# Patient Record
Sex: Female | Born: 1945 | Race: Black or African American | Hispanic: No | Marital: Married | Smoking: Former smoker
Health system: Southern US, Community
[De-identification: ages and names within clinical notes are randomized; demographics above are authoritative.]

## PROBLEM LIST (undated history)

## (undated) DIAGNOSIS — E785 Hyperlipidemia, unspecified: Secondary | ICD-10-CM

## (undated) DIAGNOSIS — I10 Essential (primary) hypertension: Secondary | ICD-10-CM

## (undated) DIAGNOSIS — E039 Hypothyroidism, unspecified: Secondary | ICD-10-CM

## (undated) HISTORY — PX: BACK SURGERY: SHX140

---

## 1997-12-14 ENCOUNTER — Emergency Department (HOSPITAL_COMMUNITY): Admission: EM | Admit: 1997-12-14 | Discharge: 1997-12-14 | Payer: Self-pay | Admitting: Emergency Medicine

## 1997-12-14 ENCOUNTER — Encounter: Payer: Self-pay | Admitting: Emergency Medicine

## 1997-12-25 ENCOUNTER — Ambulatory Visit (HOSPITAL_COMMUNITY): Admission: RE | Admit: 1997-12-25 | Discharge: 1997-12-25 | Payer: Self-pay | Admitting: Internal Medicine

## 1997-12-25 ENCOUNTER — Encounter: Payer: Self-pay | Admitting: Internal Medicine

## 1998-01-06 ENCOUNTER — Encounter: Payer: Self-pay | Admitting: Internal Medicine

## 1998-01-06 ENCOUNTER — Inpatient Hospital Stay (HOSPITAL_COMMUNITY): Admission: AD | Admit: 1998-01-06 | Discharge: 1998-01-09 | Payer: Self-pay | Admitting: Internal Medicine

## 1998-01-07 ENCOUNTER — Encounter: Payer: Self-pay | Admitting: Internal Medicine

## 1998-01-15 ENCOUNTER — Encounter: Admission: RE | Admit: 1998-01-15 | Discharge: 1998-04-15 | Payer: Self-pay | Admitting: Internal Medicine

## 2000-05-02 ENCOUNTER — Other Ambulatory Visit: Admission: RE | Admit: 2000-05-02 | Discharge: 2000-05-02 | Payer: Self-pay | Admitting: Internal Medicine

## 2001-05-08 ENCOUNTER — Other Ambulatory Visit: Admission: RE | Admit: 2001-05-08 | Discharge: 2001-05-08 | Payer: Self-pay | Admitting: Internal Medicine

## 2001-05-15 ENCOUNTER — Ambulatory Visit (HOSPITAL_COMMUNITY): Admission: RE | Admit: 2001-05-15 | Discharge: 2001-05-15 | Payer: Self-pay | Admitting: Internal Medicine

## 2001-05-15 ENCOUNTER — Encounter: Payer: Self-pay | Admitting: Internal Medicine

## 2003-08-12 ENCOUNTER — Ambulatory Visit (HOSPITAL_COMMUNITY): Admission: RE | Admit: 2003-08-12 | Discharge: 2003-08-12 | Payer: Self-pay | Admitting: Internal Medicine

## 2003-09-12 ENCOUNTER — Encounter: Admission: RE | Admit: 2003-09-12 | Discharge: 2003-09-12 | Payer: Self-pay | Admitting: Internal Medicine

## 2003-10-28 ENCOUNTER — Emergency Department (HOSPITAL_COMMUNITY): Admission: EM | Admit: 2003-10-28 | Discharge: 2003-10-28 | Payer: Self-pay | Admitting: Emergency Medicine

## 2003-10-30 ENCOUNTER — Ambulatory Visit (HOSPITAL_COMMUNITY): Admission: RE | Admit: 2003-10-30 | Discharge: 2003-10-30 | Payer: Self-pay | Admitting: Pediatrics

## 2003-12-08 ENCOUNTER — Encounter: Admission: RE | Admit: 2003-12-08 | Discharge: 2003-12-08 | Payer: Self-pay | Admitting: Neurosurgery

## 2003-12-24 ENCOUNTER — Encounter: Admission: RE | Admit: 2003-12-24 | Discharge: 2003-12-24 | Payer: Self-pay | Admitting: Neurosurgery

## 2004-01-23 ENCOUNTER — Ambulatory Visit (HOSPITAL_COMMUNITY): Admission: RE | Admit: 2004-01-23 | Discharge: 2004-01-23 | Payer: Self-pay | Admitting: Neurosurgery

## 2004-03-23 ENCOUNTER — Inpatient Hospital Stay (HOSPITAL_COMMUNITY): Admission: RE | Admit: 2004-03-23 | Discharge: 2004-03-28 | Payer: Self-pay | Admitting: Neurosurgery

## 2005-04-23 ENCOUNTER — Inpatient Hospital Stay (HOSPITAL_COMMUNITY): Admission: EM | Admit: 2005-04-23 | Discharge: 2005-04-25 | Payer: Self-pay | Admitting: Emergency Medicine

## 2007-01-22 ENCOUNTER — Encounter: Admission: RE | Admit: 2007-01-22 | Discharge: 2007-04-22 | Payer: Self-pay | Admitting: Internal Medicine

## 2010-01-07 ENCOUNTER — Ambulatory Visit (HOSPITAL_COMMUNITY): Admission: RE | Admit: 2010-01-07 | Discharge: 2010-01-07 | Payer: Self-pay | Admitting: Internal Medicine

## 2010-07-23 NOTE — Discharge Summary (Signed)
NAMESAARA, KIJOWSKI                  ACCOUNT NO.:  [id number]   MEDICAL RECORD NO.:  [id number]          PATIENT TYPE:  INP   LOCATION:  3315                         FACILITY:  [facility]   PHYSICIAN:  Isidor Holts, M.D.  DATE OF BIRTH:  [birth year]   DATE OF ADMISSION:  04/23/2005  DATE OF DISCHARGE:  04/25/2005                                 DISCHARGE SUMMARY   PRIMARY CARE PHYSICIAN:  Dr. Andi Devon   DISCHARGE DIAGNOSES:  1.  Abdominal pain, secondary to severe constipation/fecal impaction.  2.  Hypotension secondary to volume depletion plus superadded medication      side effect.  3.  Controlled type 2 diabetes mellitus.  4.  Hypothyroidism.  5.  Smoking history.  6.  Prior history transient ischemic attacks.  7.  History of lumbosacral degenerative joint disease.  8.  Iron deficiency anemia.   DISCHARGE MEDICATIONS:  1.  Prempro (0.625/2.5) one pill p.o. daily.  2.  Actos 30 mg p.o. daily.  3.  Amitriptyline 25 mg p.o. daily (was on 50 mg p.o. daily).  4.  Altace 2.5 mg p.o. daily (was on 5 mg p.o. daily).  5.  Levothyroxine 100 mcg p.o. daily.  6.  Neurontin 300 mg p.o. daily.  7.  Lantus insulin 10 units subcutaneously b.i.d. (was on 20 units      subcutaneous q.a.m. and 15 units subcutaneously q.h.s.).  8.  Aspirin 81 mg p.o. daily.  9.  Colace 100 mg p.o. b.i.d.  10. Nicoderm CQ (14 mg/24 hours) one patch to skin daily.  11. Nu-Iron 150 mg p.o. daily.   PROCEDURES:  1.  Abdominal/pelvic CT scan dated April 23, 2005.  This showed no acute      findings, marked amount of retained stool seen throughout colon and      rectum consistent with fecal impaction, also rectal impaction.  2.  Chest x-ray dated April 23, 2005.  This was negative.  There was no      heart failure or infiltrate.   CONSULTS:  None.   ADMISSION HISTORY:  As in H&P of April 23, 2005, dictated by Dr.  Isidor Holts.  However, in brief, this is a 65 year old female, with  known  history of type 2 diabetes mellitus, hypothyroidism, prior TIAs, DJD  lumbosacral spine who presents with abdominal pain and chills for one day,  but no objective fever.  She was found on initial evaluation in the  emergency department, to be hypotensive with blood pressure of 70/48 mmHg.  She was therefore admitted for further evaluation, investigation, and  management.   CLINICAL COURSE:  #1 - ABDOMINAL PAIN:  This patient presents with lower  abdominal pain, described as intermittent/crampy.  Patient had chills,  however, had no objective fever.  Abdominal CT scan was done, dated April 23, 2005.  Essentially, this showed no acute intra-abdominal pathology,  however, demonstrated fecal loading of colon and rectum consistent with  fecal impaction.  Obviously, the culprit here, was severe constipation.  The  patient had not passed any bowel movements for the past four days.  This was  unassociated with vomiting, and certainly bowel sounds were heard on  physical examination.  She was managed with Fleet enemas, Dulcolax  suppositories as required, and scheduled Colace with complete resolution of  symptoms.  Thereafter, during the course of hospital stay she was able to  move her bowels quite regularly.   #2 - HYPOTENSION:  This occurred against background of possible chills and  lower abdominal pain.  Initial suspicion was, of course, sepsis.  However,  abdominal CT scan demonstrated no acute intra-abdominal pathology.  Urinalysis was negative.  Chest x-ray was also negative.  Patient had no  documented fever.  WBC on initial presentation was 8.4.  Although this went  up to 14.6 on April 24, 2005 by April 25, 2005 it had normalized at  11.  It was deemed possibly, to be reactive.  Blood cultures remained  negative.  Patient was initially empirically started on Primaxin to afford  broad-spectrum antibiotic coverage for possible intra-abdominal sepsis or  urinary tract infection.   However, no evidence of infective focus was  elicited and Primaxin was therefore discontinued in a.m. of April 25, 2005.  Patient's hypotension responded to aggressive intravenous fluid  hydration.  As of April 25, 2005 her blood pressure was entirely  reasonable at 125/60.  Obviously, during her hypotensive episode, her  antihypertensive medication and tricyclic antidepressive medications were  held.  These have been reinstated at half the pre admission dosages, until  reviewed by patient's PMD.   #3 - TYPE 2 DIABETES MELLITUS:  This appears quite well controlled.  Patient  was managed with approximately two-thirds of her pre admission scheduled  insulin dosage.  She remained euglycemic, throughout the course of her  hospital stay.  CBG was 89 in the a.m. of April 25, 2005.  Patient is  therefore being discharged on scheduled Lantus, in a dose of 10 units  subcutaneously b.i.d.  Of course, this may be subsequently adjusted as  indicated, by patient's PMD.   #4 - SMOKING HISTORY:  Patient admits to smoking approximately half a packet  of cigarettes per day.  She has done this for approximately 30 years.  She  was managed with smoking cessation counseling and Nicoderm CQ patch.  She  appears determined to quit.   #5 - IRON DEFICIENCY ANEMIA:  CBC on April 25, 2005 showed a mild anemia  with hemoglobin of 10.5 and hematocrit of 31.9.  Iron studies showed the  following findings:  Iron 25, percentage saturation 9, TIBC 265, ferritin  40.  B12 was normal at 689.  Folate was normal at 12.7.  Patient has been  started on iron supplementation.   #6 - LUMBOSACRAL DJD:  Patient has had no symptoms referable to this during  the course of her hospital stay.   #7 - HYPOTHYROIDISM:  Patient continued on replacement therapy during the  course of her hospital stay.  TSH dated April 24, 2005, was normal at  5.499.  DISPOSITION:  Patient is deemed clinically stable and in  satisfactory  condition, to be discharged on April 25, 2005.  It is noted that cardiac  enzymes remained unelevated and EKG showed no acute ischemic changes.   DIET:  Carbohydrate modified/Healthy heart diet.   ACTIVITY:  No restrictions.   WOUND CARE:  Not applicable.   PAIN MANAGEMENT:  Not applicable.   FOLLOW-UP INSTRUCTIONS:  Patient has been recommended to follow up with her  PMD, Dr. Andi Devon, this week.  She has  verbalized understanding.  She has been instructed to call for an appointment.      Isidor Holts, M.D.  Electronically Signed     CO/MEDQ  D:  04/25/2005  T:  04/25/2005  Job:  [id number]   cc:   Merlene Laughter. Renae Gloss, M.D.  Fax: [phone]

## 2010-07-23 NOTE — H&P (Signed)
NAMEKORRINE, SICARD                  ACCOUNT NO.:  [id number]   MEDICAL RECORD NO.:  [id number]          PATIENT TYPE:  OIB   LOCATION:  NA                           FACILITY:  [facility]   PHYSICIAN:  Hilda Lias, M.D.   DATE OF BIRTH:  [birth year]   DATE OF ADMISSION:  DATE OF DISCHARGE:                                HISTORY & PHYSICAL   Ms. Gardella is a lady who is being admitted because of back pain radiating to  both legs __________.  I initially saw this lady in September 2005  complaining of back pain radiating to both legs especially with walking.  The patient had been seen by a neurologist. She had been loosing control of  her balance. She found two weeks prior to be seen by me with diagnosis of  having a mini stroke of the brain.  She is stable and she wants to proceed  with surgery because the pain is getting worse.   PAST MEDICAL HISTORY:  Two CVA taken care of by the neurologist.   ALLERGIES:  She is not allergic to any medication.   SOCIAL HISTORY:  She does not drink but she smokes two packs of cigarettes a  day.   REVIEW OF SYMPTOMS:  Positive for back pain, leg pain, diabetes mellitus.   PHYSICAL EXAMINATION:  GENERAL:  The patient came to my office walking with  a cane.  HEENT:  Head is atraumatic.  NECK:  Normal.  LUNGS:  There is rhonchi bilaterally.  HEART:  Sounds normal.  ABDOMEN:  Normal. __________ pulses.  NEUROLOGIC:  Mental status normal.  Cranial nerves normal.  EXTREMITIES:  She had weakness and dorsiflexion of both feet and mild  weakness of both quads.  but no evidence of any related to the dermatome.  Reflexes 1+, coordination normal.   She has MRI and a lumbar spine x-ray. The MRI showed that she has several  cases of degenerative disk disease with the most at L4-5 with a central disk  in the midline and the possibility of synovial cyst of the right side.   IMPRESSION:  Normal stenosis, degenerative disk disease L4-5, rule out L4-5  synovial  cyst.  Therefore, the patient being admitted for surgery. The  procedure will be a bilateral 4-5 laminotomy or laminectomy followed by  foraminotomy.  We are going to investigate the disk to see if indeed she  needs diskectomy, as well as the cyst on the right side. The risks no  improvement whatsoever, need for further surgery, changes secondary to her  diabetes, CVA, CSF leak __________.     EB/MEDQ  D:  03/23/2004  T:  03/23/2004  Job:  [id number]

## 2010-07-23 NOTE — H&P (Signed)
Grace Mitchell, Grace Mitchell                  ACCOUNT NO.:  [id number]   MEDICAL RECORD NO.:  [id number]          PATIENT TYPE:  EMS   LOCATION:  MAJO                         FACILITY:  [facility]   PHYSICIAN:  Isidor Holts, M.D.  DATE OF BIRTH:  06-13-1945   DATE OF ADMISSION:  04/23/2005  DATE OF DISCHARGE:                                HISTORY & PHYSICAL   PRIMARY CARE PHYSICIAN:  Merlene Laughter. Renae Gloss, M.D.   CHIEF COMPLAINT:  Abdominal pain and chills for one day.   HISTORY OF PRESENT ILLNESS:  This is a 65 year old female. For past medical  history, see below.  According to the patient, at about 11:30 a.m. on  April 23, 2005, she felt sudden onset of severe pain in the  suprapubic/lower abdominal region, which was intermittent, no radiation,  associated with chills and sweating, possible shortness of breath.  Denies  cough, denies chest pain, denies vomiting or diarrhea.  Eventually, the  patient's daughter brought her to the emergency department.  She has not  eaten any unusual foods in the recent past, and there is no clustering of  cases .  The patient states that she has been constipated for the last four  days, last bowel movement was about four days ago.   PAST MEDICAL HISTORY:  1.  Type 2 diabetes mellitus.  2.  Hypothyroidism.  3.  Prior TIA and mini stroke.  4.  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   MEDICATIONS:  1.  Prempro (0.625/2.5) one p.o. daily.  2.  Actos 30 mg p.o. daily.  3.  Amitriptyline 50 mg p.o. daily.  4.  Altace 5 mg p.o. daily.  5.  Levothyroxine 100 mcg p.o. daily.  6.  Neurontin 300 mg p.o. daily.  7.  Lantus insulin 20 units subcutaneously q.a.m. and 15 units      subcutaneously q.h.s.  8.  Aspirin 81 mg p.o. daily.   ALLERGIES:  No known drug allergies.   REVIEW OF SYSTEMS:  As in HPI and chief complaint, otherwise negative.   SOCIAL HISTORY:  The patient is married, and is  currently on disability.  Used to work at [employer].  She smokes about a 1/2 pack of  cigarettes per day for the past 30 years.  Denies alcohol use or drug abuse.  She has four offspring.  Had two brothers and six sisters;  two sisters are  now deceased.  All of her siblings have diabetes mellitus and a couple of  them are hypertensive.  Her mother died of cerebrovascular accident.  She  had diabetes mellitus.  Her father died of throat cancer.  He had diabetes  mellitus.   PHYSICAL EXAMINATION:  VITAL SIGNS:  Temperature 97, pulse 60 per minute and  regular, respiratory rate 22, blood pressure initially 70/48 mmHg, after  intravenous bolus of 1 L of normal saline in the emergency department,  rechecked, 101/53 mmHg.  Pulse ox of 96% on room air.  GENERAL:  The patient appears to be in  some discomfort from lower abdominal  pain.  No shortness of breath at rest.  Alert, communicative.  HEENT:  No clinical pallor, no jaundice, no conjunctival injection.  Throat  appears quite clear.  NECK:  Supple, JVP is not seen.  There is no palpable lymphadenopathy, no  palpable goiter.  CHEST:  Clear to auscultation, no wheezes, no crackles.  HEART:  Heart sounds S1 and S2 are heard, normal, regular, no murmurs.  ABDOMEN:  Obese, soft.  There is suprapubic tenderness;  however, there are  normal bowel sounds.  I am unable to palpate organs.  EXTREMITIES:  Lower extremity examination:  No pitting edema.  Palpable  peripheral pulses.  MUSCULOSKELETAL:  Not formally examined;  however, the patient does appear  to have full range of motion in all joints.  NEUROLOGIC:  No focal neurologic deficits on gross examination.   LABORATORY DATA:  CBC:  WBC 8.4, hemoglobin 12.1, hematocrit 36.9, platelets  253.  Electrolytes:  Sodium 138, potassium 4.4, chloride 106, CO2 of 26.6,  BUN 12, creatinine 1, glucose 159.  Troponin-I at point-of-care less than  0.05.  Abdominal/pelvic CT scan dated April 23, 2005, shows marked amount  of retained stool throughout the colon and the rectum, consistent with  constipation and rectal impaction.  A 12-lead EKG dated April 23, 2005,  shows sinus rhythm, regular, 94 per minute, normal axis, no acute ischemic  changes.   ASSESSMENT AND PLAN:  1.  Lower abdominal pain.  This is likely secondary to constipation, as the      patient has not moved her bowels for four days and abdominal/pelvic CT      scan confirms fecal impaction.  We shall administer laxatives, and as      needed, enemas, to disimpact the patient.  However, we do indeed have to      rule out the possibility of urinary tract infection as the patient has      abdominal pain in suprapubic location and has experienced chills.  We      shall therefore do urinalysis to confirm this.  However, meanwhile we      will cover empirically with Primaxin.   1.  Hypotension.  Query course .  It is important to rule out sepsis, as the      patient has no evidence of bleeding, and her BUN/creatinine ratio do not      suggest dehydration.  Prior to this presentation, the patient had good      oral intake.  Hypotension seems to be responding to intravenous fluids,      which we will continue, however, we will maintain a low threshold for      utilizing pressors, if needed.  We shall admit the patient to the step-      down unit for close observation.  Meanwhile, we will do a septic screen,      including blood cultures, urinalysis, chest x-ray.  We shall cycle      cardiac enzymes for completeness, although there are no acute ischemic      EKG changes.  We shall also do cortisol levels.  As mentioned above, we      will cover with empiric broad spectrum antibiotics for now.   1.  Type 2 diabetes mellitus.  This appears reasonably controlled.  We shall      check hemoglobin A1C.  Continue Lantus at approximately 2/3 of pre-      admission dosage, and cover with sliding  scale insulin.  1.   Hypothyroidism.  We shall check TSH.  Continue pre-admission dosage of      Synthroid.   1.  Smoking history.  We will offer the patient counseling and Nicoderm CQ      patch.  Further management will depend on clinical course.      Isidor Holts, M.D.  Electronically Signed     CO/MEDQ  D:  04/23/2005  T:  04/23/2005  Job:  [id number]   cc:   Merlene Laughter. Renae Gloss, M.D.  Fax: [phone]

## 2010-07-23 NOTE — Discharge Summary (Signed)
[name], [name]                  ACCOUNT NO.:  [id number]   MEDICAL RECORD NO.:  [id number]          PATIENT TYPE:  INP   LOCATION:  3040                         FACILITY:  [facility]   PHYSICIAN:  Hewitt Shorts, M.D.DATE OF BIRTH:  [birth year]   DATE OF ADMISSION:  03/23/2004  DATE OF DISCHARGE:  03/28/2004                                 DISCHARGE SUMMARY   ADMISSION PHYSICAL EXAMINATION:  The patient is a 65 year old black female,  patient of Dr. Cassandria Santee who presented with low back and bilateral lower  extremities pain.  She found lumbar stenosis and the patient was admitted  for surgical decompression.  General examination was notable for bilateral  rhonchi.  Neurologic examination showed weakness of the dorsiflexors and  quads bilaterally.   HOSPITAL COURSE:  The patient was admitted and underwent L4-L5 lumbar  laminectomy. Following surgery, she has made gradual but steady progress.  She has had some postoperative fever, as high as 101.9 and has had continued  fever, though it is gradually diminished.  Incentive spirometry was  inititiated and although the patient continues to have low grade fever, she  is insisting on being discharged home today. She was seen in OT/PT  consultations, she is up and ambulating in the halls with a rolling walker  without difficulty. She is using a minimal amount of pain medication and her  maximum temperature over the past 24 hours was 100.6, current temperature  99.8.  Her other vital signs are stable, wound is healing nicely.   It is recommended that she return to see Dr. Jeral Fruit in 2 weeks and to call  him this week to update him on her progress, and to call him at anytime if  she has any difficulties.   Discharge prescription was given for Vicodin 1 tablet q.i.d. p.r.n. pain,  #40 tablets, no refills.   DISCHARGE DIAGNOSIS:  Lumbar stenosis.      Robe   RWN/MEDQ  D:  03/28/2004  T:  03/28/2004  Job:  [id number]

## 2010-07-23 NOTE — Consult Note (Signed)
Grace Mitchell                            ACCOUNT NO.:  [id number]   MEDICAL RECORD NO.:  [id number]                   PATIENT TYPE:  EMS   LOCATION:  ED                                   FACILITY:  [facility]   PHYSICIAN:  Deanna Artis. Sharene Skeans, M.D.           DATE OF BIRTH:  Jun 03, 1945   DATE OF CONSULTATION:  10/28/2003  DATE OF DISCHARGE:                                   CONSULTATION   CHIEF COMPLAINT:  Change in vision.   HISTORY OF PRESENT CONDITION:  Grace Mitchell is a 65 year old right-handed  African American woman with a history of known diabetes of three years'  duration.  She thinks that it likely has been longer.  The patient has had  problems with back pain, unsteady gait, peripheral polyneuropathy, and was  thought on examination yesterday by Dr. Vickey Huger to have an L5 radiculopathy  on the left.   She has had an MRI scan of the lumbosacral spine which showed spondylosis  without nerve root compression.  An EMG and nerve conduction study was  pending.  She has had EMG's and nerve conductions before which were normal.  This happened in the recent past but the patient is not certain exactly when  that took place (she thinks 2003).   Last night, the patient experienced blurred vision in her left eye.  This  morning, it was much more intense.  When I asked her what she meant by  blurred, she really meant that it was double.  When she looked to the left,  she saw two images of her husband and it seemed as if the visual field was  somewhat dancing.  She never actually lost vision.   The patient also feels a bit more unsteady on her feet when she walks.  She  denies headache, dysarthria, dysphagia, weakness, numbness, tingling, or  loss of bowel and bladder control.   PAST MEDICAL HISTORY:  As noted above is remarkable for insulin-dependent  diabetes mellitus, hypothyroidism, and spondyloarthropathy with leg pain and  problems with her gait.   PAST SURGICAL HISTORY:  Tubal  ligation.  The patient has not had any other  surgical procedures.   CURRENT MEDICATIONS:  1. Lantus insulin 20 units between 12 and 12:30 in the morning, 25 units at     bedtime.  2. Actos 5 mg twice daily.  3. Altace 10 mg in the morning.  4. Amitriptyline 50 mg at nighttime.  5. Levothyroxine 100 mcg per day.   The patient is not certain about the doses of these drugs.   DRUG ALLERGIES:  None known.   REVIEW OF SYSTEMS:  The patient has not had any fever, rash, cough,  rhinorrhea, hemoptysis.  She has had no problems with shortness of breath,  chest pain, palpitations.  No nausea, vomiting, diarrhea.  She has had  problems with arthritis.  She has not had anxiety or depression (  though she  is on amitriptyline for reasons that may have more to do with pain or  insomnia than anything else).  She has not had any prior known strokes and  has not had prior problems with diplopia.   FAMILY HISTORY:  Remarkable for diabetes in her parents and her other eight  siblings.  She has a sister who had a stroke.  There is no one with heart  disease.  Her father died of colon cancer.  Her mother had hydrocephalus  requiring a shunt (I suspect normal-pressure hydrocephalus though I do not  know).  She also had a gangrenous colon that had to be removed.  The cause  of her death is unclear.   SOCIAL HISTORY:  The patient works at [employer].  She does not use  tobacco or alcohol.  She is married.   PHYSICAL EXAMINATION:  GENERAL:  This is a pleasant woman in no acute  distress.  VITAL SIGNS:  Blood pressure 134/86, resting pulse 108, respirations 20,  temperature 96.8.  HEENT:  No signs of infection.  NECK:  Supple, full range of motion, no cranial or cervical bruits.  LUNGS:  Clear to auscultation.  HEART:  No murmurs.  Pulses normal.  ABDOMEN:  Soft, nontender.  Bowel sounds normal.  EXTREMITIES:  Well formed without edema or cyanosis.  She has positive  straight leg raising  bilaterally at about 15 degrees.  NEUROLOGIC:  Mental status:  The patient was awake, alert, attentive,  appropriate.  No dysphagia, dyspraxia, dysarthria.  Good memory.  Fully  oriented with normal memory, cognition, and language.  Cranial nerves:  Round, reactive pupils.  Normal fundi.  Visual fields full.  Extraocular  movements show evidence of a weakness in the right medial rectus.  She  cannot deduct her right eye past neutral position.  She is able to move it  vertically both up and down and abduct it.  This is causing her double  vision and is likely related to a partial third nerve palsy.  Symmetric  facial strength.  Midline tongue and uvula.  Air conduction greater than  bone conduction.  She was able to protrude her tongue and elevate her uvula  in the midline.  Motor examination:  The patient had normal strength in her  arms and legs.  Good fine motor movements.  No pronator drift.  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  She has  decreased vibratory sense, barely feeling at the toes.  She had good  proprioception.  She also had good stereoagnosis.  Cerebellar examination:  Good finger-to-nose.  Rapid repetitive movements.  Gait is broad based.  She  feels unsteady.  She has to look at her feet.  She thinks that she is more  unsteady than she was.  Deep tendon reflexes were absent.  The patient had  the appearance of bilateral extensor plantar responses although it seemed to  me that she was withdrawing.  I could not get her to get her toe down with  the plantar response.   Evaluation in the emergency room has included a CT scan of the brain which  shows an ill-defined right frontal low density area.  This was done without  contrast and could represent a remote stroke, tumor, or demyelinating lesion  among other things.  I do not see any other signs of stroke in the patient. There is a mild amount of atrophy.  Ventricles are at the  top  limits of  normal.   Other laboratory:  White count is 7100, hemoglobin 13, hematocrit 39.9, MCV  83.7, platelet count 303,000, neutrophils 52, lymphocytes 38, monocytes 7,  eosinophils 2.  Sodium 141, potassium 3.6, chloride 103, glucose 74, BUN 6,  creatinine 0.6, calcium 9.8.   The patient has been treated with fluids, oxygen, and the head of her bed is  flat.  I do not believe that this is a stroke.  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  I do not know what  the etiology of the right frontal lobe lesion is but it needs evaluation  with an MRI scan of the brain.  The patient also has significant lumbar  spondylosis.  I appreciate the opportunity to participate in her care.  If  you have any questions about this or I  can be of assistance, do not hesitate to contact me.  I have not yet made a  decision whether or not to admit the patient.  We will try to obtain an MRI  scan as an outpatient and if it does not show evidence of acute stroke or  demyelinating lesions, we will send the patient home.                                               Deanna Artis. Sharene Skeans, M.D.    [facility]  D:  10/28/2003  T:  10/28/2003  Job:  [id number]   cc:   Melvyn Novas, M.D.  [address]  [address]  [place]  [place] [address]  Fax: [phone]   Merlene Laughter. Renae Gloss, M.D.  [address]  [address]  [place]  [place] [address]  Fax: [phone]

## 2010-07-23 NOTE — Op Note (Signed)
[name], Grace Mitchell                  ACCOUNT NO.:  [id number]   MEDICAL RECORD NO.:  [id number]          PATIENT TYPE:  OIB   LOCATION:  NA                           FACILITY:  [facility]   PHYSICIAN:  Hilda Lias, M.D.   DATE OF BIRTH:  September 03, 1945   DATE OF PROCEDURE:  03/23/2004  DATE OF DISCHARGE:                                 OPERATIVE REPORT   PREOPERATIVE DIAGNOSES:  1.  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  2.  Diabetes mellitus.   POSTOPERATIVE DIAGNOSES:  1.  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  2.  Diabetes mellitus.   OPERATIVE PROCEDURES:  1.  Bilateral L4-5 laminectomy.  2.  Decompression of  the L4, L5 and S1 nerve roots.  3.  Foraminotomy.   SURGEON:  Hilda Lias, M.D.   ASSISTANT:  Danae Orleans. Venetia Maxon, M.D.   BRIEF CLINICAL HISTORY:  The patient was admitted because of back pain  radiating down to both legs.  The patient has a history of a stroke in the  past.  The pain became worse, especially with her walking.  X-rays show  severe stenosis at L4-5 with posterior herniated disk and arachnoid cyst.  Surgery was advised, and the risks were explained in the history and  physical.   DESCRIPTION OF PROCEDURE:  The patient was taken to the OR, and she was  positioned in a prone manner.  The back was prepped with Betadine.  A  midline incision from L4 down to L5-S1 was made, and muscles were retracted  laterally.  We tried to remove the spinous process at L4-5.  We found that  the patient has quite a bit of thickening of the yellow ligament.  Using the  drill as well as the 1 and 2 mm Kerrison punch, we started doing laminotomy  but because of the extension of the thickened yellow ligament, we went ahead  and did laminectomy of L4-5 bilaterally.  The dura mater was thinned out and  decompressed.  Having removed the lamina and the yellow ligament, we went  laterally to decompress the L4 and L5 nerve roots.  The patient had stenosis  both at  L4-5, worse at the level of L5.  Using the 1 and 2 mm Kerrison  punches as well as the curved Kerrison punch, foraminotomies to decompress  the L4-5 and S1 nerve roots were achieved.  At the end, we had a good  decompression.  We investigated the area of the disk, and there was no  evidence of any herniated disk bulging.  No need for diskectomy.  There was  no evidence of any arachnoid cyst.  Investigation involved to L4 and down to  S1, showed that the canal was open.  From then on, Valsalva maneuver was  negative.  The area was irrigated.  Fentanyl was left in the epidural space  and the wound was closed with Vicryl and Steri-Strips.       EB/MEDQ  D:  03/23/2004  T:  03/23/2004  Job:  [id number]   cc:   Merlene Laughter. Renae Gloss, M.D.  [phone]  [address]  [address]  [place]  [place] [address]  Fax: [phone]

## 2011-01-24 ENCOUNTER — Other Ambulatory Visit (HOSPITAL_COMMUNITY): Payer: Self-pay | Admitting: Internal Medicine

## 2011-01-24 DIAGNOSIS — Z1231 Encounter for screening mammogram for malignant neoplasm of breast: Secondary | ICD-10-CM

## 2011-01-26 ENCOUNTER — Ambulatory Visit (HOSPITAL_COMMUNITY)
Admission: RE | Admit: 2011-01-26 | Discharge: 2011-01-26 | Disposition: A | Discharge status: Home / Self Care | Payer: Medicaid Other | Source: Ambulatory Visit | Attending: Internal Medicine | Admitting: Internal Medicine

## 2011-01-26 DIAGNOSIS — Z1231 Encounter for screening mammogram for malignant neoplasm of breast: Secondary | ICD-10-CM | POA: Insufficient documentation

## 2011-04-18 ENCOUNTER — Emergency Department (HOSPITAL_COMMUNITY): Payer: Medicare Other

## 2011-04-18 ENCOUNTER — Encounter (HOSPITAL_COMMUNITY): Payer: Self-pay | Admitting: Emergency Medicine

## 2011-04-18 ENCOUNTER — Emergency Department (HOSPITAL_COMMUNITY)
Admission: EM | Admit: 2011-04-18 | Discharge: 2011-04-19 | Disposition: A | Discharge status: Home / Self Care | Payer: Medicare Other | Attending: Emergency Medicine | Admitting: Emergency Medicine

## 2011-04-18 DIAGNOSIS — W010XXA Fall on same level from slipping, tripping and stumbling without subsequent striking against object, initial encounter: Secondary | ICD-10-CM | POA: Insufficient documentation

## 2011-04-18 DIAGNOSIS — M25539 Pain in unspecified wrist: Secondary | ICD-10-CM | POA: Insufficient documentation

## 2011-04-18 DIAGNOSIS — M25532 Pain in left wrist: Secondary | ICD-10-CM

## 2011-04-18 DIAGNOSIS — Z79899 Other long term (current) drug therapy: Secondary | ICD-10-CM | POA: Insufficient documentation

## 2011-04-18 DIAGNOSIS — Z794 Long term (current) use of insulin: Secondary | ICD-10-CM | POA: Insufficient documentation

## 2011-04-18 DIAGNOSIS — M25439 Effusion, unspecified wrist: Secondary | ICD-10-CM | POA: Insufficient documentation

## 2011-04-18 DIAGNOSIS — R000 Tachycardia, unspecified: Secondary | ICD-10-CM | POA: Insufficient documentation

## 2011-04-18 DIAGNOSIS — E119 Type 2 diabetes mellitus without complications: Secondary | ICD-10-CM | POA: Insufficient documentation

## 2011-04-18 DIAGNOSIS — F17200 Nicotine dependence, unspecified, uncomplicated: Secondary | ICD-10-CM | POA: Insufficient documentation

## 2011-04-18 NOTE — ED Notes (Signed)
Pt was ambulating when she went to turn she fell and landed on her L wrist. Pt sts this occurred earlier today, but pain has increased. Pt able to move extremity, but with pain. CMS intact.

## 2011-04-19 MED ORDER — HYDROCODONE-ACETAMINOPHEN 5-325 MG PO TABS
1.0000 | ORAL_TABLET | Freq: Once | ORAL | Status: AC
  Administered 2011-04-19: 1 via ORAL
  Filled 2011-04-19: qty 1

## 2011-04-19 MED ORDER — HYDROCODONE-ACETAMINOPHEN 5-325 MG PO TABS: 1.0000 | ORAL_TABLET | ORAL | Status: AC | PRN

## 2011-04-19 NOTE — Discharge Instructions (Signed)
Wrist Pain Wrist injuries are frequent in adults and children. A sprain is an injury to the ligaments that hold your bones together. A strain is an injury to muscle or muscle cord-like structures (tendons) from stretching or pulling. Generally, when wrists are moderately tender to touch following a fall or injury, a break in the bone (fracture) may be present. Most wrist sprains or strains are better in 3 to 5 days, but complete healing may take several weeks. HOME CARE INSTRUCTIONS   Put ice on the injured area.   Put ice in a plastic bag.   Place a towel between your skin and the bag.   Leave the ice on for 15 to 20 minutes, 3 to 4 times a day, for the first 2 days.   Keep your arm raised above the level of your heart whenever possible to reduce swelling and pain.   Rest the injured area for at least 48 hours or as directed by your caregiver.   If a splint or elastic bandage has been applied, use it for as long as directed by your caregiver or until seen by a caregiver for a follow-up exam.   Only take over-the-counter or prescription medicines for pain, discomfort, or fever as directed by your caregiver.   Keep all follow-up appointments. You may need to follow up with a specialist or have follow-up X-rays. Improvement in pain level is not a guarantee that you did not fracture a bone in your wrist. The only way to determine whether or not you have a broken bone is by X-ray.  SEEK IMMEDIATE MEDICAL CARE IF:   Your fingers are swollen, very red, white, or cold and blue.   Your fingers are numb or tingling.   You have increasing pain.   You have difficulty moving your fingers.  MAKE SURE YOU:   Understand these instructions.   Will watch your condition.   Will get help right away if you are not doing well or get worse.  Document Released: 12/01/2004 Document Revised: 11/03/2010 Document Reviewed: 04/14/2010 [employer] Patient Information 2012 [employer]. 

## 2011-04-19 NOTE — ED Provider Notes (Signed)
History     CSN: [id number]  Arrival date & time 04/18/11  2242   First MD Initiated Contact with Patient 04/18/11 2355      Chief Complaint  Patient presents with  . Wrist Pain    (Consider location/radiation/quality/duration/timing/severity/associated sxs/prior treatment) HPI Comments: Patient here after mechanical fall where she went to turn and lost her balance and fell onto her left wrist - reports continued pain and swelling - denies numbness or tingling - here to make sure it is not broken.  Patient is a 66 y.o. female presenting with wrist pain. The history is provided by the patient. No language interpreter was used.  Wrist Pain This is a new problem. The current episode started today. The problem occurs constantly. The problem has been unchanged. Associated symptoms include arthralgias and joint swelling. Pertinent negatives include no abdominal pain, anorexia, change in bowel habit, chest pain, chills, congestion, coughing, diaphoresis, fatigue, fever, myalgias, nausea, neck pain, numbness, rash, sore throat, swollen glands, urinary symptoms, visual change, vomiting or weakness. The symptoms are aggravated by nothing. She has tried nothing for the symptoms. The treatment provided no relief.  Wrist Pain This is a new problem. The current episode started today. The problem occurs constantly. The problem has been unchanged. Pertinent negatives include no chest pain and no abdominal pain. The symptoms are aggravated by nothing. She has tried nothing for the symptoms. The treatment provided no relief.    Past Medical History  Diagnosis Date  . Diabetes mellitus     History reviewed. No pertinent past surgical history.  History reviewed. No pertinent family history.  History  Substance Use Topics  . Smoking status: Smoker, Current Status Unknown -- 0.5 packs/day  . Smokeless tobacco: Not on file  . Alcohol Use:     OB History    Grav Para Term Preterm Abortions TAB SAB  Ect Mult Living                  Review of Systems  Constitutional: Negative for fever, chills, diaphoresis and fatigue.  HENT: Negative for congestion, sore throat and neck pain.   Respiratory: Negative for cough.   Cardiovascular: Negative for chest pain.  Gastrointestinal: Negative for nausea, vomiting, abdominal pain, anorexia and change in bowel habit.  Musculoskeletal: Positive for joint swelling and arthralgias. Negative for myalgias.  Skin: Negative for rash.  Neurological: Negative for weakness and numbness.  All other systems reviewed and are negative.    Allergies  Review of patient's allergies indicates no known allergies.  Home Medications   Current Outpatient Rx  Name Route Sig Dispense Refill  . ALENDRONATE SODIUM 70 MG PO TABS Oral Take 70 mg by mouth every 7 (seven) days. Takes on Tuesdays. Take with a full glass of water on an empty stomach.    . AMITRIPTYLINE HCL 50 MG PO TABS Oral Take 50 mg by mouth at bedtime.    . ASPIRIN EC 81 MG PO TBEC Oral Take 81 mg by mouth daily.    . INSULIN GLARGINE 100 UNIT/ML [place] SOLN Subcutaneous Inject 40-60 Units into the skin 2 (two) times daily. 40 units in the morning and 60 units at bedtime    . PIOGLITAZONE HCL 15 MG PO TABS Oral Take 45 mg by mouth daily.     Marland Kitchen RAMIPRIL 5 MG PO CAPS Oral Take 5 mg by mouth daily.    Marland Kitchen SIMVASTATIN 20 MG PO TABS Oral Take 20 mg by mouth every evening.    Marland Kitchen  SITAGLIPTIN-METFORMIN HCL 50-500 MG PO TABS Oral Take 1 tablet by mouth 2 (two) times daily with a meal.      BP 152/79  Pulse 111  Temp(Src) 98.1 F (36.7 C) (Oral)  Resp 20  SpO2 100%  Physical Exam  Nursing note and vitals reviewed. Constitutional: She is oriented to person, place, and time. She appears well-developed and well-nourished. She appears distressed.  HENT:  Head: Normocephalic and atraumatic.  Right Ear: External ear normal.  Left Ear: External ear normal.  Nose: Nose normal.  Mouth/Throat: Oropharynx is clear  and moist. No oropharyngeal exudate.  Eyes: Conjunctivae are normal. Pupils are equal, round, and reactive to light. No scleral icterus.  Neck: Normal range of motion. Neck supple.  Cardiovascular: Regular rhythm and normal heart sounds.  Exam reveals no gallop and no friction rub.   No murmur heard.      tachycardia  Pulmonary/Chest: Effort normal and breath sounds normal. No respiratory distress. She has no wheezes. She exhibits no tenderness.  Abdominal: Soft. Bowel sounds are normal. She exhibits no distension. There is no tenderness.  Musculoskeletal:       Left wrist: She exhibits decreased range of motion, tenderness, bony tenderness and swelling. She exhibits no effusion, no crepitus, no deformity and no laceration.  Lymphadenopathy:    She has no cervical adenopathy.  Neurological: She is alert and oriented to person, place, and time. No cranial nerve deficit.  Skin: Skin is warm and dry. No rash noted. No erythema. No pallor.  Psychiatric: She has a normal mood and affect. Her behavior is normal. Judgment and thought content normal.    ED Course  Procedures (including critical care time)  Labs Reviewed - No data to display Dg Wrist Complete Left  04/18/2011  *RADIOLOGY REPORT*  Clinical Data: The patient fell tonight.  Wrist pain.  LEFT WRIST - COMPLETE 3+ VIEW  Comparison: None  Findings: The left wrist appears intact.  No evidence of acute fracture or subluxation.  No focal bone lesion or bone destruction. No abnormal periosteal reaction.  No significant soft tissue swelling.  Mild degenerative changes suggested in the STT joints.  IMPRESSION: No acute bony abnormalities demonstrated.  Original Report Authenticated By: Marlon Pel, M.D.     Left wrist pain    MDM  Patient with left wrist pain s/p mechanical fall - noted with tachycardia likely secondary to the pain - will place in wrist splint and give pain medication.        Izola Price [place], [place] 04/19/11  0037  Medical screening examination/treatment/procedure(s) were performed by non-physician practitioner and as supervising physician I was immediately available for consultation/collaboration.   Sunnie Nielsen, MD 04/19/11 [phone]

## 2012-02-10 ENCOUNTER — Other Ambulatory Visit (HOSPITAL_COMMUNITY): Payer: Self-pay | Admitting: Internal Medicine

## 2012-02-10 DIAGNOSIS — Z1231 Encounter for screening mammogram for malignant neoplasm of breast: Secondary | ICD-10-CM

## 2012-03-02 ENCOUNTER — Ambulatory Visit (HOSPITAL_COMMUNITY): Payer: Medicare Other

## 2012-09-11 ENCOUNTER — Emergency Department (HOSPITAL_COMMUNITY)
Admission: EM | Admit: 2012-09-11 | Discharge: 2012-09-11 | Disposition: A | Discharge status: Home / Self Care | Payer: Medicare Other | Attending: Emergency Medicine | Admitting: Emergency Medicine

## 2012-09-11 ENCOUNTER — Emergency Department (HOSPITAL_COMMUNITY): Payer: Medicare Other

## 2012-09-11 ENCOUNTER — Encounter (HOSPITAL_COMMUNITY): Payer: Self-pay | Admitting: Emergency Medicine

## 2012-09-11 DIAGNOSIS — M25462 Effusion, left knee: Secondary | ICD-10-CM

## 2012-09-11 DIAGNOSIS — Z794 Long term (current) use of insulin: Secondary | ICD-10-CM | POA: Insufficient documentation

## 2012-09-11 DIAGNOSIS — M25469 Effusion, unspecified knee: Secondary | ICD-10-CM | POA: Insufficient documentation

## 2012-09-11 DIAGNOSIS — M7989 Other specified soft tissue disorders: Secondary | ICD-10-CM

## 2012-09-11 DIAGNOSIS — M79609 Pain in unspecified limb: Secondary | ICD-10-CM

## 2012-09-11 DIAGNOSIS — Z79899 Other long term (current) drug therapy: Secondary | ICD-10-CM | POA: Insufficient documentation

## 2012-09-11 DIAGNOSIS — Z7982 Long term (current) use of aspirin: Secondary | ICD-10-CM | POA: Insufficient documentation

## 2012-09-11 DIAGNOSIS — E119 Type 2 diabetes mellitus without complications: Secondary | ICD-10-CM | POA: Insufficient documentation

## 2012-09-11 DIAGNOSIS — F1010 Alcohol abuse, uncomplicated: Secondary | ICD-10-CM | POA: Insufficient documentation

## 2012-09-11 DIAGNOSIS — R609 Edema, unspecified: Secondary | ICD-10-CM | POA: Insufficient documentation

## 2012-09-11 LAB — SYNOVIAL CELL COUNT + DIFF, W/ CRYSTALS
Crystals, Fluid: NONE SEEN
Lymphocytes-Synovial Fld: 37 % — ABNORMAL HIGH (ref 0–20)
Neutrophil, Synovial: 36 % — ABNORMAL HIGH (ref 0–25)
WBC, Synovial: UNDETERMINED /mm3 (ref 0–200)

## 2012-09-11 LAB — GRAM STAIN

## 2012-09-11 MED ORDER — HYDROCODONE-ACETAMINOPHEN 5-325 MG PO TABS: ORAL_TABLET | ORAL | Status: DC

## 2012-09-11 MED ORDER — HYDROCODONE-ACETAMINOPHEN 5-325 MG PO TABS
1.0000 | ORAL_TABLET | Freq: Once | ORAL | Status: AC
  Administered 2012-09-11: 1 via ORAL
  Filled 2012-09-11: qty 1

## 2012-09-11 NOTE — ED Provider Notes (Signed)
Discussed case with Wynetta Emery, PA-C at 3:05PM, transfer of care from [facility], PA-C.  3:30PM Patient was discharged not by me, but by another provider before fluid analysis was processed. When went to introduce myself to patient, patient was not in room - patient was stated to be discharged and patient's name not found on board.   9:29PM Spoke with Jackquline Bosch - verified by DOB - via telephone explaining findings of synovial fluid studies - negative gram stain and negative synovial fluid cell count findings. Recommended patient to follow-up with PCP and orthopedics, Dr. Yevette Edwards. Negative septic joint. Discussed with patient to take pain medications as prescribed. Discussed with patient to continue to monitor symptoms and if symptoms are to worsen or change to report back to the ED. Patient agreed, understood, all questions answered.    [employer], PA-C 09/12/12 [phone]

## 2012-09-11 NOTE — ED Notes (Signed)
Pt states she began to have R knee and leg pain and swelling yesterday.  Pt denies numbness. Pt able to ambulate with cane, but hurts more than usual.

## 2012-09-11 NOTE — ED Notes (Signed)
MD at bedside. 

## 2012-09-11 NOTE — Progress Notes (Signed)
*  PRELIMINARY RESULTS* Vascular Ultrasound Left lower extremity venous duplex has been completed.  Preliminary findings: Left = negative for DVT. 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      Farrel Demark, RDMS, RVT  09/11/2012, 2:16 PM

## 2012-09-11 NOTE — ED Notes (Signed)
US at bedside

## 2012-09-11 NOTE — Progress Notes (Signed)
PROCEDURE NOTE L Knee Aspiration.   After consent was obtained, using sterile technique the L knee was prepped and 4 ml's of 1% plain Lidocaine used to anesthetize the needle tract into the joint from the lateral suprapatellar approach. The knee joint was entered and 17 ml's of serosanguinous, non cloudy colored fluid was withdrawn and sent for cell count, crystal analysis, and culture.   The procedure was well tolerated.  Precautions reviewed and pt to f/u w/ PCP for further concerns.   Shelly Flatten, MD Family Medicine PGY-3 09/11/2012, 2:47 PM

## 2012-09-11 NOTE — ED Provider Notes (Signed)
History    CSN: [id number] Arrival date & time 09/11/12  1307  First MD Initiated Contact with Patient 09/11/12 1314     Chief Complaint  Patient presents with  . Knee Pain   (Consider location/radiation/quality/duration/timing/severity/associated sxs/prior Treatment) Patient is a 67 y.o. female presenting with knee pain.  Knee Pain Associated symptoms: no fever    DEEANNE DEININGER is a 67 y.o. female complaining of left knee pain onset yesterday. Patient denies trauma, prior history of issues with this knee. Rates her pain as severe, 10 out of 10, mildly alleviated by Tylenol #3. Patient normally ambulates with a cane due to her balance issues but she has needed it for pain and stabilization. Denies fever, decreased ROM, prior trauma/surgery to affected knee.   Past Medical History  Diagnosis Date  . Diabetes mellitus    History reviewed. No pertinent past surgical history. History reviewed. No pertinent family history. History  Substance Use Topics  . Smoking status: Smoker, Current Status Unknown -- 0.50 packs/day  . Smokeless tobacco: Not on file  . Alcohol Use:    OB History   Grav Para Term Preterm Abortions TAB SAB Ect Mult Living                 Review of Systems  Constitutional: Negative for fever.  Respiratory: Negative for shortness of breath.   Cardiovascular: Negative for chest pain.  Gastrointestinal: Negative for nausea, vomiting, abdominal pain and diarrhea.  Musculoskeletal: Positive for arthralgias.  All other systems reviewed and are negative.    Allergies  Review of patient's allergies indicates no known allergies.  Home Medications   Current Outpatient Rx  Name  Route  Sig  Dispense  Refill  . alendronate (FOSAMAX) 70 MG tablet   Oral   Take 70 mg by mouth every 7 (seven) days. Takes on Tuesdays. Take with a full glass of water on an empty stomach.         Marland Kitchen amitriptyline (ELAVIL) 50 MG tablet   Oral   Take 50 mg by mouth at bedtime.         Marland Kitchen aspirin EC 81 MG tablet   Oral   Take 81 mg by mouth daily.         . insulin glargine (LANTUS) 100 UNIT/ML injection   Subcutaneous   Inject 40-60 Units into the skin 2 (two) times daily. 40 units in the morning and 60 units at bedtime         . pioglitazone (ACTOS) 15 MG tablet   Oral   Take 45 mg by mouth daily.          . ramipril (ALTACE) 5 MG capsule   Oral   Take 5 mg by mouth daily.         . simvastatin (ZOCOR) 20 MG tablet   Oral   Take 20 mg by mouth every evening.         . sitaGLIPtan-metformin (JANUMET) 50-500 MG per tablet   Oral   Take 1 tablet by mouth 2 (two) times daily with a meal.          BP 154/82  Pulse 102  Temp(Src) 97.5 F (36.4 C) (Oral)  Resp 16  SpO2 99% Physical Exam  Nursing note and vitals reviewed. Constitutional: She is oriented to person, place, and time. She appears well-developed and well-nourished. No distress.  HENT:  Head: Normocephalic.  Mouth/Throat: Oropharynx is clear and moist.  Eyes: Conjunctivae and EOM are normal.  Cardiovascular: Normal rate and intact distal pulses.   Pulmonary/Chest: Effort normal. No stridor.  Musculoskeletal: Normal range of motion. She exhibits edema.  Moderate effusion with crepitance. Moderate warmth, reduced range of motion. Patient is diffusely tender to palpation. No deformity, erythema or abrasions.  Anterior and posterior drawer show no abnormal laxity. Stable to valgus and varus stress.  Neurovascularly intact. Pt ambulates with non-antalgic gait.   No calf asymmetry, superficial collaterals, palpable cords, Homans sign negative bilaterally.   Patient is 1+ pitting edema to left ankle   Neurological: She is alert and oriented to person, place, and time.  Psychiatric: She has a normal mood and affect.    ED Course  Procedures (including critical care time) Labs Reviewed  GRAM STAIN  BODY FLUID CULTURE  CELL COUNT + DIFF,  W/ CRYST-SYNVL FLD   Dg Knee Complete 4  Views Left  09/11/2012   *RADIOLOGY REPORT*  Clinical Data: Left knee pain and swelling  LEFT KNEE - COMPLETE 4+ VIEW  Comparison: None.  Findings: Four views of the left knee submitted.  No acute fracture or subluxation.  Mild narrowing of medial joint compartment.  Small joint effusion.  IMPRESSION: No acute fracture or subluxation.  Mild degenerative changes. Small joint effusion.   Original Report Authenticated By: Natasha Mead, M.D.   1. Knee effusion, left     MDM   Filed Vitals:   09/11/12 1317 09/11/12 1508  BP: 154/82 142/68  Pulse: 102 97  Temp: 97.5 F (36.4 C) 98.1 F (36.7 C)  TempSrc: Oral Oral  Resp: 16 16  SpO2: 99% 100%     ILKA LOVICK is a 67 y.o. female concern for septic joint, joint tap performed by family practice resident Dr. Margot Ables. Case signed out to PA [place] at shift change pending fluid analysis.   Medications  HYDROcodone-acetaminophen (NORCO/VICODIN) 5-325 MG per tablet 1 tablet (not administered)   Pt is hemodynamically stable, appropriate for, and amenable to discharge at this time. Pt verbalized understanding and agrees with care plan. Outpatient follow-up and specific return precautions discussed.    New Prescriptions   HYDROCODONE-ACETAMINOPHEN (NORCO/VICODIN) 5-325 MG PER TABLET    Take 1-2 tablets by mouth every 6 hours as needed for pain.     Wynetta Emery, PA-C 09/11/12 1515

## 2012-09-12 NOTE — ED Provider Notes (Signed)
Medical screening examination/treatment/procedure(s) were conducted as a shared visit with non-physician practitioner(s) and myself.  I personally evaluated the patient during the encounter.   Knee aspiration performed under sterile conditions.   Approximately 50 cc of light clear pink fluid extracted.  Cultures and Gram stains sent to lab  Donnetta Hutching, MD 09/12/12 1513

## 2012-09-12 NOTE — ED Provider Notes (Signed)
Medical screening examination/treatment/procedure(s) were performed by non-physician practitioner and as supervising physician I was immediately available for consultation/collaboration.  Evarose Altland, MD 09/12/12 1815 

## 2012-09-15 LAB — BODY FLUID CULTURE

## 2013-08-23 ENCOUNTER — Ambulatory Visit: Payer: Medicare HMO | Attending: Internal Medicine | Admitting: Physical Therapy

## 2013-08-23 ENCOUNTER — Ambulatory Visit: Payer: Medicare HMO

## 2013-08-23 DIAGNOSIS — M6281 Muscle weakness (generalized): Secondary | ICD-10-CM | POA: Insufficient documentation

## 2013-08-23 DIAGNOSIS — M2560 Stiffness of unspecified joint, not elsewhere classified: Secondary | ICD-10-CM | POA: Insufficient documentation

## 2013-08-23 DIAGNOSIS — R279 Unspecified lack of coordination: Secondary | ICD-10-CM | POA: Diagnosis not present

## 2013-08-23 DIAGNOSIS — Z5189 Encounter for other specified aftercare: Secondary | ICD-10-CM | POA: Insufficient documentation

## 2013-08-23 DIAGNOSIS — I69993 Ataxia following unspecified cerebrovascular disease: Secondary | ICD-10-CM | POA: Diagnosis not present

## 2013-08-23 DIAGNOSIS — R262 Difficulty in walking, not elsewhere classified: Secondary | ICD-10-CM | POA: Insufficient documentation

## 2013-08-23 DIAGNOSIS — I69998 Other sequelae following unspecified cerebrovascular disease: Secondary | ICD-10-CM | POA: Insufficient documentation

## 2013-08-28 ENCOUNTER — Ambulatory Visit: Payer: Medicare HMO | Admitting: Occupational Therapy

## 2013-08-28 ENCOUNTER — Ambulatory Visit: Payer: Medicare HMO

## 2013-08-28 DIAGNOSIS — Z5189 Encounter for other specified aftercare: Secondary | ICD-10-CM | POA: Diagnosis not present

## 2013-09-03 ENCOUNTER — Ambulatory Visit: Payer: Medicare HMO | Admitting: Rehabilitative and Restorative Service Providers"

## 2013-09-03 ENCOUNTER — Ambulatory Visit: Payer: Medicare HMO | Admitting: Occupational Therapy

## 2013-09-03 DIAGNOSIS — Z5189 Encounter for other specified aftercare: Secondary | ICD-10-CM | POA: Diagnosis not present

## 2013-09-04 ENCOUNTER — Ambulatory Visit: Payer: Medicare HMO | Admitting: Physical Therapy

## 2013-09-04 ENCOUNTER — Ambulatory Visit: Payer: Medicare HMO | Attending: Internal Medicine | Admitting: Occupational Therapy

## 2013-09-04 DIAGNOSIS — I69998 Other sequelae following unspecified cerebrovascular disease: Secondary | ICD-10-CM | POA: Diagnosis not present

## 2013-09-04 DIAGNOSIS — R279 Unspecified lack of coordination: Secondary | ICD-10-CM | POA: Insufficient documentation

## 2013-09-04 DIAGNOSIS — M2560 Stiffness of unspecified joint, not elsewhere classified: Secondary | ICD-10-CM | POA: Insufficient documentation

## 2013-09-04 DIAGNOSIS — R262 Difficulty in walking, not elsewhere classified: Secondary | ICD-10-CM | POA: Insufficient documentation

## 2013-09-04 DIAGNOSIS — M6281 Muscle weakness (generalized): Secondary | ICD-10-CM | POA: Insufficient documentation

## 2013-09-04 DIAGNOSIS — I69993 Ataxia following unspecified cerebrovascular disease: Secondary | ICD-10-CM | POA: Insufficient documentation

## 2013-09-04 DIAGNOSIS — Z5189 Encounter for other specified aftercare: Secondary | ICD-10-CM | POA: Diagnosis present

## 2013-09-09 ENCOUNTER — Ambulatory Visit: Payer: Medicare Other | Admitting: Rehabilitative and Restorative Service Providers"

## 2013-09-09 ENCOUNTER — Encounter: Payer: Medicare Other | Admitting: Occupational Therapy

## 2013-09-10 ENCOUNTER — Ambulatory Visit: Payer: Medicare HMO | Admitting: Rehabilitative and Restorative Service Providers"

## 2013-09-10 ENCOUNTER — Ambulatory Visit: Payer: Medicare HMO | Admitting: Occupational Therapy

## 2013-09-10 DIAGNOSIS — Z5189 Encounter for other specified aftercare: Secondary | ICD-10-CM | POA: Diagnosis not present

## 2013-09-12 ENCOUNTER — Ambulatory Visit: Payer: Medicare HMO | Admitting: Rehabilitative and Restorative Service Providers"

## 2013-09-12 ENCOUNTER — Ambulatory Visit: Payer: Medicare HMO | Admitting: Occupational Therapy

## 2013-09-12 DIAGNOSIS — Z5189 Encounter for other specified aftercare: Secondary | ICD-10-CM | POA: Diagnosis not present

## 2013-09-16 ENCOUNTER — Encounter: Payer: Medicare Other | Admitting: Occupational Therapy

## 2013-09-16 ENCOUNTER — Ambulatory Visit: Payer: Medicare Other | Admitting: Rehabilitative and Restorative Service Providers"

## 2013-09-18 ENCOUNTER — Ambulatory Visit: Payer: Medicare HMO | Admitting: Occupational Therapy

## 2013-09-18 ENCOUNTER — Ambulatory Visit: Payer: Medicare HMO | Admitting: Rehabilitative and Restorative Service Providers"

## 2013-09-18 DIAGNOSIS — Z5189 Encounter for other specified aftercare: Secondary | ICD-10-CM | POA: Diagnosis not present

## 2013-09-20 ENCOUNTER — Ambulatory Visit: Payer: Medicare Other | Admitting: Physical Therapy

## 2013-09-20 ENCOUNTER — Encounter: Payer: Medicare Other | Admitting: Occupational Therapy

## 2013-09-23 ENCOUNTER — Ambulatory Visit: Payer: Medicare HMO | Admitting: Occupational Therapy

## 2013-09-23 ENCOUNTER — Ambulatory Visit: Payer: Medicare HMO | Admitting: Rehabilitative and Restorative Service Providers"

## 2013-09-23 DIAGNOSIS — Z5189 Encounter for other specified aftercare: Secondary | ICD-10-CM | POA: Diagnosis not present

## 2013-09-25 ENCOUNTER — Ambulatory Visit: Payer: Medicare HMO | Admitting: Occupational Therapy

## 2013-09-25 ENCOUNTER — Ambulatory Visit: Payer: Medicare HMO | Admitting: Rehabilitative and Restorative Service Providers"

## 2013-09-25 DIAGNOSIS — Z5189 Encounter for other specified aftercare: Secondary | ICD-10-CM | POA: Diagnosis not present

## 2013-09-30 ENCOUNTER — Ambulatory Visit: Payer: Medicare HMO | Admitting: Occupational Therapy

## 2013-09-30 ENCOUNTER — Ambulatory Visit: Payer: Medicare HMO | Admitting: Rehabilitative and Restorative Service Providers"

## 2013-09-30 DIAGNOSIS — Z5189 Encounter for other specified aftercare: Secondary | ICD-10-CM | POA: Diagnosis not present

## 2013-10-02 ENCOUNTER — Ambulatory Visit: Payer: Medicare Other | Admitting: Rehabilitative and Restorative Service Providers"

## 2013-10-02 ENCOUNTER — Ambulatory Visit: Payer: Medicare HMO | Admitting: Occupational Therapy

## 2014-01-09 ENCOUNTER — Other Ambulatory Visit (HOSPITAL_COMMUNITY): Payer: Self-pay | Admitting: Internal Medicine

## 2014-01-09 DIAGNOSIS — Z1231 Encounter for screening mammogram for malignant neoplasm of breast: Secondary | ICD-10-CM

## 2014-02-04 ENCOUNTER — Ambulatory Visit (HOSPITAL_COMMUNITY)
Admission: RE | Admit: 2014-02-04 | Discharge: 2014-02-04 | Disposition: A | Discharge status: Home / Self Care | Payer: Commercial Managed Care - HMO | Source: Ambulatory Visit | Attending: Internal Medicine | Admitting: Internal Medicine

## 2014-02-04 DIAGNOSIS — Z1231 Encounter for screening mammogram for malignant neoplasm of breast: Secondary | ICD-10-CM | POA: Insufficient documentation

## 2014-05-20 ENCOUNTER — Encounter (HOSPITAL_COMMUNITY): Payer: Self-pay | Admitting: *Deleted

## 2014-05-20 ENCOUNTER — Inpatient Hospital Stay (HOSPITAL_COMMUNITY)
Admission: EM | Admit: 2014-05-20 | Discharge: 2014-06-13 | DRG: 004 | Disposition: A | Discharge status: Skilled Nursing Facility | Payer: Commercial Managed Care - HMO | Attending: Emergency Medicine | Admitting: Emergency Medicine

## 2014-05-20 ENCOUNTER — Inpatient Hospital Stay (HOSPITAL_COMMUNITY): Payer: Commercial Managed Care - HMO

## 2014-05-20 ENCOUNTER — Emergency Department (HOSPITAL_COMMUNITY): Payer: Commercial Managed Care - HMO

## 2014-05-20 DIAGNOSIS — Z7982 Long term (current) use of aspirin: Secondary | ICD-10-CM | POA: Diagnosis not present

## 2014-05-20 DIAGNOSIS — E876 Hypokalemia: Secondary | ICD-10-CM | POA: Diagnosis not present

## 2014-05-20 DIAGNOSIS — J95851 Ventilator associated pneumonia: Secondary | ICD-10-CM | POA: Diagnosis not present

## 2014-05-20 DIAGNOSIS — N179 Acute kidney failure, unspecified: Secondary | ICD-10-CM | POA: Diagnosis present

## 2014-05-20 DIAGNOSIS — E870 Hyperosmolality and hypernatremia: Secondary | ICD-10-CM | POA: Diagnosis not present

## 2014-05-20 DIAGNOSIS — R21 Rash and other nonspecific skin eruption: Secondary | ICD-10-CM

## 2014-05-20 DIAGNOSIS — E039 Hypothyroidism, unspecified: Secondary | ICD-10-CM | POA: Diagnosis present

## 2014-05-20 DIAGNOSIS — J80 Acute respiratory distress syndrome: Secondary | ICD-10-CM

## 2014-05-20 DIAGNOSIS — Y95 Nosocomial condition: Secondary | ICD-10-CM | POA: Diagnosis present

## 2014-05-20 DIAGNOSIS — E1140 Type 2 diabetes mellitus with diabetic neuropathy, unspecified: Secondary | ICD-10-CM | POA: Diagnosis present

## 2014-05-20 DIAGNOSIS — J9620 Acute and chronic respiratory failure, unspecified whether with hypoxia or hypercapnia: Secondary | ICD-10-CM

## 2014-05-20 DIAGNOSIS — Z930 Tracheostomy status: Secondary | ICD-10-CM | POA: Diagnosis not present

## 2014-05-20 DIAGNOSIS — J09X1 Influenza due to identified novel influenza A virus with pneumonia: Secondary | ICD-10-CM | POA: Diagnosis present

## 2014-05-20 DIAGNOSIS — J9621 Acute and chronic respiratory failure with hypoxia: Secondary | ICD-10-CM | POA: Diagnosis not present

## 2014-05-20 DIAGNOSIS — A419 Sepsis, unspecified organism: Secondary | ICD-10-CM

## 2014-05-20 DIAGNOSIS — E785 Hyperlipidemia, unspecified: Secondary | ICD-10-CM | POA: Diagnosis present

## 2014-05-20 DIAGNOSIS — G8929 Other chronic pain: Secondary | ICD-10-CM | POA: Diagnosis present

## 2014-05-20 DIAGNOSIS — Z794 Long term (current) use of insulin: Secondary | ICD-10-CM

## 2014-05-20 DIAGNOSIS — J189 Pneumonia, unspecified organism: Secondary | ICD-10-CM

## 2014-05-20 DIAGNOSIS — J155 Pneumonia due to Escherichia coli: Secondary | ICD-10-CM | POA: Diagnosis present

## 2014-05-20 DIAGNOSIS — D649 Anemia, unspecified: Secondary | ICD-10-CM | POA: Diagnosis present

## 2014-05-20 DIAGNOSIS — R509 Fever, unspecified: Secondary | ICD-10-CM

## 2014-05-20 DIAGNOSIS — J13 Pneumonia due to Streptococcus pneumoniae: Secondary | ICD-10-CM | POA: Diagnosis present

## 2014-05-20 DIAGNOSIS — J9691 Respiratory failure, unspecified with hypoxia: Secondary | ICD-10-CM | POA: Diagnosis present

## 2014-05-20 DIAGNOSIS — Z79899 Other long term (current) drug therapy: Secondary | ICD-10-CM

## 2014-05-20 DIAGNOSIS — F419 Anxiety disorder, unspecified: Secondary | ICD-10-CM | POA: Diagnosis present

## 2014-05-20 DIAGNOSIS — R1310 Dysphagia, unspecified: Secondary | ICD-10-CM

## 2014-05-20 DIAGNOSIS — E8770 Fluid overload, unspecified: Secondary | ICD-10-CM | POA: Diagnosis present

## 2014-05-20 DIAGNOSIS — J101 Influenza due to other identified influenza virus with other respiratory manifestations: Secondary | ICD-10-CM

## 2014-05-20 DIAGNOSIS — J9690 Respiratory failure, unspecified, unspecified whether with hypoxia or hypercapnia: Secondary | ICD-10-CM

## 2014-05-20 DIAGNOSIS — A498 Other bacterial infections of unspecified site: Secondary | ICD-10-CM | POA: Diagnosis not present

## 2014-05-20 DIAGNOSIS — G6281 Critical illness polyneuropathy: Secondary | ICD-10-CM | POA: Diagnosis not present

## 2014-05-20 DIAGNOSIS — R6520 Severe sepsis without septic shock: Secondary | ICD-10-CM | POA: Diagnosis present

## 2014-05-20 DIAGNOSIS — I10 Essential (primary) hypertension: Secondary | ICD-10-CM | POA: Diagnosis present

## 2014-05-20 DIAGNOSIS — F17210 Nicotine dependence, cigarettes, uncomplicated: Secondary | ICD-10-CM | POA: Diagnosis present

## 2014-05-20 DIAGNOSIS — J9601 Acute respiratory failure with hypoxia: Secondary | ICD-10-CM | POA: Diagnosis not present

## 2014-05-20 DIAGNOSIS — E871 Hypo-osmolality and hyponatremia: Secondary | ICD-10-CM | POA: Diagnosis present

## 2014-05-20 DIAGNOSIS — A403 Sepsis due to Streptococcus pneumoniae: Secondary | ICD-10-CM | POA: Diagnosis not present

## 2014-05-20 DIAGNOSIS — J9600 Acute respiratory failure, unspecified whether with hypoxia or hypercapnia: Secondary | ICD-10-CM

## 2014-05-20 DIAGNOSIS — J15212 Pneumonia due to Methicillin resistant Staphylococcus aureus: Secondary | ICD-10-CM | POA: Diagnosis present

## 2014-05-20 DIAGNOSIS — G4700 Insomnia, unspecified: Secondary | ICD-10-CM | POA: Diagnosis present

## 2014-05-20 DIAGNOSIS — Z978 Presence of other specified devices: Secondary | ICD-10-CM

## 2014-05-20 DIAGNOSIS — Z9289 Personal history of other medical treatment: Secondary | ICD-10-CM

## 2014-05-20 DIAGNOSIS — Z4659 Encounter for fitting and adjustment of other gastrointestinal appliance and device: Secondary | ICD-10-CM

## 2014-05-20 DIAGNOSIS — Z1612 Extended spectrum beta lactamase (ESBL) resistance: Secondary | ICD-10-CM

## 2014-05-20 HISTORY — DX: Hyperlipidemia, unspecified: E78.5

## 2014-05-20 HISTORY — DX: Essential (primary) hypertension: I10

## 2014-05-20 HISTORY — DX: Hypothyroidism, unspecified: E03.9

## 2014-05-20 LAB — TROPONIN I: Troponin I: 0.03 ng/mL (ref <0.031)

## 2014-05-20 LAB — PROCALCITONIN: Procalcitonin: 6.94 ng/mL

## 2014-05-20 LAB — COMPREHENSIVE METABOLIC PANEL
ALT: 11 U/L (ref 0–35)
AST: 29 U/L (ref 0–37)
Albumin: 3.6 g/dL (ref 3.5–5.2)
Alkaline Phosphatase: 79 U/L (ref 39–117)
Anion gap: 15 (ref 5–15)
BUN: 20 mg/dL (ref 6–23)
CHLORIDE: 95 mmol/L — AB (ref 96–112)
CO2: 21 mmol/L (ref 19–32)
Calcium: 8.5 mg/dL (ref 8.4–10.5)
Creatinine, Ser: 1.24 mg/dL — ABNORMAL HIGH (ref 0.50–1.10)
GFR calc Af Amer: 51 mL/min — ABNORMAL LOW (ref >90)
GFR, EST NON AFRICAN AMERICAN: 44 mL/min — AB (ref >90)
GLUCOSE: 227 mg/dL — AB (ref 70–99)
POTASSIUM: 4.2 mmol/L (ref 3.5–5.1)
SODIUM: 131 mmol/L — AB (ref 135–145)
TOTAL PROTEIN: 7.6 g/dL (ref 6.0–8.3)
Total Bilirubin: 0.9 mg/dL (ref 0.3–1.2)

## 2014-05-20 LAB — CBC WITH DIFFERENTIAL/PLATELET
BASOS ABS: 0 10*3/uL (ref 0.0–0.1)
BASOS PCT: 0 % (ref 0–1)
Eosinophils Absolute: 0 10*3/uL (ref 0.0–0.7)
Eosinophils Relative: 0 % (ref 0–5)
HCT: 37.3 % (ref 36.0–46.0)
Hemoglobin: 11.7 g/dL — ABNORMAL LOW (ref 12.0–15.0)
Lymphocytes Relative: 7 % — ABNORMAL LOW (ref 12–46)
Lymphs Abs: 1.1 10*3/uL (ref 0.7–4.0)
MCH: 26.4 pg (ref 26.0–34.0)
MCHC: 31.4 g/dL (ref 30.0–36.0)
MCV: 84.2 fL (ref 78.0–100.0)
Monocytes Absolute: 0.6 10*3/uL (ref 0.1–1.0)
Monocytes Relative: 4 % (ref 3–12)
NEUTROS ABS: 14.2 10*3/uL — AB (ref 1.7–7.7)
NEUTROS PCT: 89 % — AB (ref 43–77)
Platelets: 194 10*3/uL (ref 150–400)
RBC: 4.43 MIL/uL (ref 3.87–5.11)
RDW: 13.8 % (ref 11.5–15.5)
WBC: 16 10*3/uL — ABNORMAL HIGH (ref 4.0–10.5)

## 2014-05-20 LAB — BLOOD GAS, ARTERIAL
ACID-BASE DEFICIT: 6.3 mmol/L — AB (ref 0.0–2.0)
Bicarbonate: 17.9 mEq/L — ABNORMAL LOW (ref 20.0–24.0)
Drawn by: 307971
FIO2: 1 %
O2 SAT: 95.5 %
PO2 ART: 88.6 mmHg (ref 80.0–100.0)
Patient temperature: 98.6
TCO2: 16.7 mmol/L (ref 0–100)
pCO2 arterial: 32.4 mmHg — ABNORMAL LOW (ref 35.0–45.0)
pH, Arterial: 7.36 (ref 7.350–7.450)

## 2014-05-20 LAB — URINALYSIS, ROUTINE W REFLEX MICROSCOPIC
BILIRUBIN URINE: NEGATIVE
Glucose, UA: 500 mg/dL — AB
KETONES UR: NEGATIVE mg/dL
Leukocytes, UA: NEGATIVE
Nitrite: NEGATIVE
PROTEIN: 30 mg/dL — AB
Specific Gravity, Urine: 1.016 (ref 1.005–1.030)
Urobilinogen, UA: 0.2 mg/dL (ref 0.0–1.0)
pH: 5.5 (ref 5.0–8.0)

## 2014-05-20 LAB — LACTIC ACID, PLASMA: LACTIC ACID, VENOUS: 2.5 mmol/L — AB (ref 0.5–2.0)

## 2014-05-20 LAB — CBG MONITORING, ED: GLUCOSE-CAPILLARY: 234 mg/dL — AB (ref 70–99)

## 2014-05-20 LAB — URINE MICROSCOPIC-ADD ON

## 2014-05-20 LAB — I-STAT CG4 LACTIC ACID, ED: LACTIC ACID, VENOUS: 3.31 mmol/L — AB (ref 0.5–2.0)

## 2014-05-20 LAB — TSH: TSH: 0.063 u[IU]/mL — AB (ref 0.350–4.500)

## 2014-05-20 LAB — STREP PNEUMONIAE URINARY ANTIGEN: Strep Pneumo Urinary Antigen: POSITIVE — AB

## 2014-05-20 LAB — GLUCOSE, CAPILLARY: GLUCOSE-CAPILLARY: 200 mg/dL — AB (ref 70–99)

## 2014-05-20 LAB — PROTIME-INR
INR: 1.15 (ref 0.00–1.49)
Prothrombin Time: 14.8 seconds (ref 11.6–15.2)

## 2014-05-20 LAB — MRSA PCR SCREENING: MRSA by PCR: NEGATIVE

## 2014-05-20 MED ORDER — SODIUM CHLORIDE 0.9 % IV SOLN
INTRAVENOUS | Status: DC
  Administered 2014-05-20 – 2014-05-21 (×2): via INTRAVENOUS
  Administered 2014-05-22 (×2): 75 mL/h via INTRAVENOUS

## 2014-05-20 MED ORDER — LEVALBUTEROL HCL 0.63 MG/3ML IN NEBU: 0.6300 mg | INHALATION_SOLUTION | RESPIRATORY_TRACT | Status: DC | PRN

## 2014-05-20 MED ORDER — DEXTROSE 5 % IV SOLN
1.0000 g | INTRAVENOUS | Status: DC
  Administered 2014-05-20 – 2014-05-29 (×10): 1 g via INTRAVENOUS
  Filled 2014-05-20 (×10): qty 10

## 2014-05-20 MED ORDER — OSELTAMIVIR PHOSPHATE 75 MG PO CAPS
75.0000 mg | ORAL_CAPSULE | Freq: Every day | ORAL | Status: DC
  Administered 2014-05-20: 75 mg via ORAL
  Filled 2014-05-20: qty 1

## 2014-05-20 MED ORDER — OSELTAMIVIR PHOSPHATE 75 MG PO CAPS
75.0000 mg | ORAL_CAPSULE | Freq: Two times a day (BID) | ORAL | Status: DC
  Administered 2014-05-20 – 2014-05-21 (×2): 75 mg via ORAL
  Filled 2014-05-20 (×4): qty 1

## 2014-05-20 MED ORDER — SODIUM CHLORIDE 0.9 % IV BOLUS (SEPSIS)
500.0000 mL | INTRAVENOUS | Status: AC
  Administered 2014-05-20: 500 mL via INTRAVENOUS

## 2014-05-20 MED ORDER — DEXTROSE 5 % IV SOLN
500.0000 mg | INTRAVENOUS | Status: DC
  Administered 2014-05-20 – 2014-05-23 (×4): 500 mg via INTRAVENOUS
  Filled 2014-05-20 (×4): qty 500

## 2014-05-20 MED ORDER — ASPIRIN EC 81 MG PO TBEC
81.0000 mg | DELAYED_RELEASE_TABLET | Freq: Every day | ORAL | Status: DC
  Administered 2014-05-21 – 2014-05-23 (×2): 81 mg via ORAL
  Filled 2014-05-20 (×3): qty 1

## 2014-05-20 MED ORDER — SODIUM CHLORIDE 0.9 % IV BOLUS (SEPSIS)
500.0000 mL | Freq: Once | INTRAVENOUS | Status: AC
  Administered 2014-05-21: 500 mL via INTRAVENOUS

## 2014-05-20 MED ORDER — ACETAMINOPHEN 325 MG PO TABS
650.0000 mg | ORAL_TABLET | ORAL | Status: DC | PRN
  Administered 2014-05-21: 650 mg via ORAL
  Filled 2014-05-20: qty 2

## 2014-05-20 MED ORDER — SODIUM CHLORIDE 0.9 % IV BOLUS (SEPSIS)
1000.0000 mL | INTRAVENOUS | Status: AC
  Administered 2014-05-20 (×2): 1000 mL via INTRAVENOUS

## 2014-05-20 MED ORDER — VANCOMYCIN HCL IN DEXTROSE 1-5 GM/200ML-% IV SOLN
1000.0000 mg | Freq: Once | INTRAVENOUS | Status: AC
  Administered 2014-05-20: 1000 mg via INTRAVENOUS
  Filled 2014-05-20: qty 200

## 2014-05-20 MED ORDER — HEPARIN SODIUM (PORCINE) 5000 UNIT/ML IJ SOLN
5000.0000 [IU] | Freq: Three times a day (TID) | INTRAMUSCULAR | Status: DC
  Administered 2014-05-20 – 2014-05-21 (×2): 5000 [IU] via SUBCUTANEOUS
  Filled 2014-05-20 (×3): qty 1

## 2014-05-20 MED ORDER — SODIUM CHLORIDE 0.9 % IV BOLUS (SEPSIS)
500.0000 mL | Freq: Once | INTRAVENOUS | Status: AC
  Administered 2014-05-20: 500 mL via INTRAVENOUS

## 2014-05-20 MED ORDER — SODIUM CHLORIDE 0.9 % IV SOLN
250.0000 mL | INTRAVENOUS | Status: DC | PRN
  Administered 2014-05-25: 250 mL via INTRAVENOUS
  Administered 2014-05-30: 10 mL via INTRAVENOUS
  Administered 2014-06-05: 500 mL via INTRAVENOUS
  Administered 2014-06-09: 250 mL via INTRAVENOUS

## 2014-05-20 MED ORDER — ACETAMINOPHEN 325 MG PO TABS
650.0000 mg | ORAL_TABLET | Freq: Once | ORAL | Status: AC
  Administered 2014-05-20: 650 mg via ORAL
  Filled 2014-05-20: qty 2

## 2014-05-20 MED ORDER — PIPERACILLIN-TAZOBACTAM 3.375 G IVPB 30 MIN
3.3750 g | Freq: Once | INTRAVENOUS | Status: AC
  Administered 2014-05-20: 3.375 g via INTRAVENOUS
  Filled 2014-05-20: qty 50

## 2014-05-20 MED ORDER — MORPHINE SULFATE 2 MG/ML IJ SOLN
1.0000 mg | INTRAMUSCULAR | Status: DC | PRN
  Administered 2014-05-21 (×3): 2 mg via INTRAVENOUS
  Filled 2014-05-20 (×3): qty 1

## 2014-05-20 MED ORDER — LEVOTHYROXINE SODIUM 75 MCG PO TABS
150.0000 ug | ORAL_TABLET | Freq: Every day | ORAL | Status: DC
  Administered 2014-05-21: 150 ug via ORAL
  Filled 2014-05-20: qty 1

## 2014-05-20 MED ORDER — SIMVASTATIN 10 MG PO TABS
20.0000 mg | ORAL_TABLET | Freq: Every evening | ORAL | Status: DC
  Administered 2014-05-20: 20 mg via ORAL
  Filled 2014-05-20: qty 1
  Filled 2014-05-20: qty 2

## 2014-05-20 MED ORDER — INSULIN ASPART 100 UNIT/ML ~~LOC~~ SOLN
0.0000 [IU] | SUBCUTANEOUS | Status: DC
  Administered 2014-05-20 – 2014-05-21 (×3): 3 [IU] via SUBCUTANEOUS

## 2014-05-20 NOTE — ED Provider Notes (Signed)
CSN: [id number]     Arrival date & time 05/20/14  1549 History   First MD Initiated Contact with Patient 05/20/14 1615     Chief Complaint  Patient presents with  . Fever  . Generalized Body Aches     (Consider location/radiation/quality/duration/timing/severity/associated sxs/prior Treatment) HPI Comments: Level V caveat for acuity of condition. Patient from home with four-day history of fever, generalized body aches and chills. Denies cough. Poor appetite and not taking her insulin as prescribed. Patient is a diabetic and denies any other medical history. She is alert and oriented 2. She denies any focal area of pain. No chest pain abdominal pain or headache. She is tachycardic, febrile and hypoxic.`  The history is provided by the patient and a relative. The history is limited by the condition of the patient.    Past Medical History  Diagnosis Date  . Diabetes mellitus   . Hypothyroidism   . Hypertension   . Hyperlipidemia    Past Surgical History  Procedure Laterality Date  . Back surgery     History reviewed. No pertinent family history. History  Substance Use Topics  . Smoking status: Former Smoker -- 0.50 packs/day    Quit date: 05/06/2014  . Smokeless tobacco: Never Used  . Alcohol Use: No   OB History    No data available     Review of Systems  Unable to perform ROS: Acuity of condition  Constitutional: Positive for fever.      Allergies  Review of patient's allergies indicates no known allergies.  Home Medications   Prior to Admission medications   Medication Sig Start Date End Date Taking? Authorizing Provider  amitriptyline (ELAVIL) 50 MG tablet Take 50 mg by mouth at bedtime.   Yes Historical Provider, MD  aspirin EC 81 MG tablet Take 81 mg by mouth daily.   Yes Historical Provider, MD  HYDROcodone-acetaminophen (NORCO/VICODIN) 5-325 MG per tablet Take 1-2 tablets by mouth every 6 hours as needed for pain. Patient not taking: Reported on 05/20/2014  09/11/12   Joni ReiningNicole Pisciotta, PA-C  insulin glargine (LANTUS) 100 UNIT/ML injection Inject 40-60 Units into the skin 2 (two) times daily. 40 units in the morning and 60 units at bedtime   Yes Historical Provider, MD  levothyroxine (SYNTHROID, LEVOTHROID) 150 MCG tablet Take 150 mcg by mouth daily before breakfast.   Yes Historical Provider, MD  ramipril (ALTACE) 5 MG capsule Take 5 mg by mouth daily.   Yes Historical Provider, MD  simvastatin (ZOCOR) 20 MG tablet Take 20 mg by mouth every evening.   Yes Historical Provider, MD  sitaGLIPtan-metformin (JANUMET) 50-500 MG per tablet Take 1 tablet by mouth daily.    Yes Historical Provider, MD   BP 153/71 mmHg  Pulse 39  Temp(Src) 98.9 F (37.2 C) (Oral)  Resp 36  Ht 5\' 4"  (1.626 m)  Wt 161 lb 9.6 oz (73.3 kg)  BMI 27.72 kg/m2  SpO2 98% Physical Exam  Constitutional: She appears well-developed. She appears distressed.  Sleepy, arousable, answers questions appropriately  HENT:  Head: Normocephalic and atraumatic.  Mouth/Throat: Oropharynx is clear and moist.  Eyes: Conjunctivae and EOM are normal. Pupils are equal, round, and reactive to light.  Neck: Normal range of motion. Neck supple.  No meningismus  Cardiovascular: Normal rate and normal heart sounds.   tachycardic  Pulmonary/Chest: She is in respiratory distress. She has wheezes.  tachypneic with shallow respirations and rhonchi  Abdominal: Soft. There is no tenderness. There is no rebound and  no guarding.  Musculoskeletal: Normal range of motion. She exhibits no edema or tenderness.  Neurological: She is alert.  Oriented to person and place  Skin: Skin is warm.    ED Course  Procedures (including critical care time) Labs Review Labs Reviewed  CBC WITH DIFFERENTIAL/PLATELET - Abnormal; Notable for the following:    WBC 16.0 (*)    Hemoglobin 11.7 (*)    Neutrophils Relative % 89 (*)    Neutro Abs 14.2 (*)    Lymphocytes Relative 7 (*)    All other components within normal  limits  COMPREHENSIVE METABOLIC PANEL - Abnormal; Notable for the following:    Sodium 131 (*)    Chloride 95 (*)    Glucose, Bld 227 (*)    Creatinine, Ser 1.24 (*)    GFR calc non Af Amer 44 (*)    GFR calc Af Amer 51 (*)    All other components within normal limits  URINALYSIS, ROUTINE W REFLEX MICROSCOPIC - Abnormal; Notable for the following:    APPearance CLOUDY (*)    Glucose, UA 500 (*)    Hgb urine dipstick MODERATE (*)    Protein, ur 30 (*)    All other components within normal limits  BLOOD GAS, ARTERIAL - Abnormal; Notable for the following:    pCO2 arterial 32.4 (*)    Bicarbonate 17.9 (*)    Acid-base deficit 6.3 (*)    All other components within normal limits  LACTIC ACID, PLASMA - Abnormal; Notable for the following:    Lactic Acid, Venous 2.5 (*)    All other components within normal limits  TSH - Abnormal; Notable for the following:    TSH 0.063 (*)    All other components within normal limits  STREP PNEUMONIAE URINARY ANTIGEN - Abnormal; Notable for the following:    Strep Pneumo Urinary Antigen POSITIVE (*)    All other components within normal limits  URINE MICROSCOPIC-ADD ON - Abnormal; Notable for the following:    Squamous Epithelial / LPF FEW (*)    Bacteria, UA FEW (*)    Casts GRANULAR CAST (*)    All other components within normal limits  GLUCOSE, CAPILLARY - Abnormal; Notable for the following:    Glucose-Capillary 200 (*)    All other components within normal limits  I-STAT CG4 LACTIC ACID, ED - Abnormal; Notable for the following:    Lactic Acid, Venous 3.31 (*)    All other components within normal limits  CBG MONITORING, ED - Abnormal; Notable for the following:    Glucose-Capillary 234 (*)    All other components within normal limits  MRSA PCR SCREENING  CULTURE, BLOOD (ROUTINE X 2)  CULTURE, BLOOD (ROUTINE X 2)  URINE CULTURE  CULTURE, EXPECTORATED SPUTUM-ASSESSMENT  TROPONIN I  PROTIME-INR  TROPONIN I  PROCALCITONIN  INFLUENZA  PANEL BY PCR (TYPE A & B, H1N1)  CBC  BASIC METABOLIC PANEL  MAGNESIUM  PHOSPHORUS  INFLUENZA PANEL BY PCR (TYPE A & B, H1N1)  LEGIONELLA ANTIGEN, URINE  I-STAT CG4 LACTIC ACID, ED    Imaging Review Dg Chest Port 1 View  05/21/2014   CLINICAL DATA:  Worsening shortness of breath and decreased O2 saturation. Initial encounter.  EXAM: PORTABLE CHEST - 1 VIEW  COMPARISON:  Chest radiograph performed earlier today at 4:36 p.m.  FINDINGS: Significantly worsened diffuse bilateral airspace opacification may reflect diffuse pneumonia, pulmonary edema or ARDS. No pleural effusion or pneumothorax is seen.  The cardiomediastinal silhouette is borderline normal in  size. No acute osseous abnormalities are identified.  IMPRESSION: Significantly worsened diffuse bilateral airspace opacification may reflect diffuse pneumonia, pulmonary edema or ARDS.   Electronically Signed   By: Roanna Raider M.D.   On: 05/21/2014 01:18   Dg Chest Port 1 View  05/20/2014   CLINICAL DATA:  Fever for 4 days.  EXAM: PORTABLE CHEST - 1 VIEW  COMPARISON:  04/23/2005  FINDINGS: Patchy bilateral mid and lower lung zone airspace opacities have developed. Normal heart size. No pneumothorax.  IMPRESSION: Patchy bilateral airspace disease worrisome for pneumonia, ARDS, or pulmonary edema.   Electronically Signed   By: Jolaine Click M.D.   On: 05/20/2014 16:43     EKG Interpretation   Date/Time:  Tuesday May 20 2014 16:28:03 EDT Ventricular Rate:  144 PR Interval:  123 QRS Duration: 81 QT Interval:  347 QTC Calculation: 537 R Axis:   33 Text Interpretation:  Sinus tachycardia Paired ventricular premature  complexes LAE, consider biatrial enlargement RSR' in V1 or V2, probably  normal variant Borderline abnrm T, anterolateral leads Prolonged QT  interval Baseline wander in lead(s) V3 Artifact Rate faster Confirmed by  Manus Gunning  MD, Shaela Boer [phone]) on 05/20/2014 4:32:25 PM      MDM   Final diagnoses:  Acute respiratory  failure with hypoxia  CAP (community acquired pneumonia)  Sepsis, due to unspecified organism   patient from home with fever, body aches and chills for 3 days. She is febrile, tachycardic and hypoxic. Code sepsis activated on arrival. CBG to 234.  Nasal cannula increased patient's oxygenation to 87% on 6 L. She was placed on nonrebreather.  Lactate 3.3.  CXR with patchy infiltrates.  Antibiotics given.  IVF given. Mental status improving and answering questions appropriately.   ABG without significant CO2 retention.  Protecting airway at this time.  tachypneic still to 30s, tachycardic, high O2 requirement.  ICU admission as she is at high risk for decompensation and may need intubation.  CRITICAL CARE Performed by: Glynn Octave Total critical care time: 60 Critical care time was exclusive of separately billable procedures and treating other patients. Critical care was necessary to treat or prevent imminent or life-threatening deterioration. Critical care was time spent personally by me on the following activities: development of treatment plan with patient and/or surrogate as well as nursing, discussions with consultants, evaluation of patient's response to treatment, examination of patient, obtaining history from patient or surrogate, ordering and performing treatments and interventions, ordering and review of laboratory studies, ordering and review of radiographic studies, pulse oximetry and re-evaluation of patient's condition.   Glynn Octave, MD 05/21/14 [phone]

## 2014-05-20 NOTE — ED Notes (Signed)
Attempted to call report, RN unavailble

## 2014-05-20 NOTE — H&P (Signed)
PULMONARY / CRITICAL CARE MEDICINE   Name: Grace Mitchell MRN: [id number] DOB: [birth year]    ADMISSION DATE:  05/20/2014 CONSULTATION DATE:  05/20/2014  REFERRING MD :  EDP  CHIEF COMPLAINT:  Body aches, SOB, chills, fever  INITIAL PRESENTATION:  69 y.o. F brought to [facility] ED 3/15 with 4 day hx of generalized body aches, SOB, subjective fevers, chills.  In ED, had fever to 103, was placed on NRB for hypoxia and had RR in high 30's.  PCCM called for admission due to concern for potential of deterioration.     STUDIES:  CXR 3/15 >>> patchy bilateral disease  SIGNIFICANT EVENTS: 3/15 - admit   HISTORY OF PRESENT ILLNESS: Grace Mitchell is a 69 y.o. F with PMH of DM on insulin, hypothyroidism, HTN, HLD.  She was brought to [facility] ED 3/15 by her family due to generalized body aches, SOB, subjective fevers, chills, decreased PO intake, and "just not feeling well."  Pt thinks her symptoms began roughly 10 days ago; however, she didn't think much of it.  Over past 4 days, symptoms worsened and on 3/15 her SOB was severe enough that family felt she needed medical evaluation. On day of ED presentation she also began to have productive cough with yellow - brown colored sputum.  She denies any headaches, chest pain, wheezing, hemoptysis, N/V/D, abdominal pain, calf pain.  No recent travel.  Has been exposed to a few sick contacts - husband and grandson both had the cold a few days ago.  In ED, she had fever to 103, was hypoxic despite 6L O2 via [place] so was placed on NRB.  She remained tachypneic with RR in the high 30's.  PCCM was called for admission due to concern for potential of deterioration and possibility of requiring NIMV vs mechanical ventilation.  She has never required O2 at home and has never had symptoms like this in the past.  She has heavy smoking hx, roughly 1ppd x 30 - 40 years.  She informs me that she quit 2 weeks ago.   PAST MEDICAL HISTORY :   has a past medical history of Diabetes  mellitus; Hypothyroidism; Hypertension; and Hyperlipidemia.  has past surgical history that includes Back surgery. Prior to Admission medications   Medication Sig Start Date End Date Taking? Authorizing Provider  amitriptyline (ELAVIL) 50 MG tablet Take 50 mg by mouth at bedtime.   Yes Historical Provider, MD  aspirin EC 81 MG tablet Take 81 mg by mouth daily.   Yes Historical Provider, MD  HYDROcodone-acetaminophen (NORCO/VICODIN) 5-325 MG per tablet Take 1-2 tablets by mouth every 6 hours as needed for pain. Patient not taking: Reported on 05/20/2014 09/11/12   Joni Reining Pisciotta, PA-C  insulin glargine (LANTUS) 100 UNIT/ML injection Inject 40-60 Units into the skin 2 (two) times daily. 40 units in the morning and 60 units at bedtime   Yes Historical Provider, MD  levothyroxine (SYNTHROID, LEVOTHROID) 150 MCG tablet Take 150 mcg by mouth daily before breakfast.   Yes Historical Provider, MD  ramipril (ALTACE) 5 MG capsule Take 5 mg by mouth daily.   Yes Historical Provider, MD  simvastatin (ZOCOR) 20 MG tablet Take 20 mg by mouth every evening.   Yes Historical Provider, MD  sitaGLIPtan-metformin (JANUMET) 50-500 MG per tablet Take 1 tablet by mouth daily.    Yes Historical Provider, MD   No Known Allergies  FAMILY HISTORY:  History reviewed. No pertinent family history.  SOCIAL HISTORY:  reports that she quit  smoking about 2 weeks ago. She has never used smokeless tobacco. She reports that she does not drink alcohol or use illicit drugs.  REVIEW OF SYSTEMS:   All negative; except for those that are bolded, which indicate positives.  Constitutional: weight loss, weight gain, night sweats, fevers, chills, fatigue, weakness.  HEENT: headaches, sore throat, sneezing, nasal congestion, post nasal drip, difficulty swallowing, tooth/dental problems, visual complaints, visual changes, ear aches. Neuro: difficulty with speech, weakness, numbness, ataxia. CV:  chest pain, orthopnea, PND, swelling in  lower extremities, dizziness, palpitations, syncope.  Resp: cough, hemoptysis, dyspnea, wheezing. GI  heartburn, indigestion, abdominal pain, nausea, vomiting, diarrhea, constipation, change in bowel habits, loss of appetite, hematemesis, melena, hematochezia.  GU: dysuria, change in color of urine, urgency or frequency, flank pain, hematuria. MSK: joint pain or swelling, decreased range of motion, general body aches. Psych: change in mood or affect, depression, anxiety, suicidal ideations, homicidal ideations. Skin: rash, itching, bruising.   SUBJECTIVE:  Still feels SOB but mainly with exertion only.  RR in high 30's and occasionally 40's when communicating with me.  VITAL SIGNS: Temp:  [98.5 F (36.9 C)-103.1 F (39.5 C)] 98.5 F (36.9 C) (03/15 1946) Pulse Rate:  [127-150] 129 (03/15 1845) Resp:  [24-42] 26 (03/15 1900) BP: (110-167)/(53-70) 118/57 mmHg (03/15 1900) SpO2:  [77 %-99 %] 92 % (03/15 1845) Weight:  [72.576 kg (160 lb)] 72.576 kg (160 lb) (03/15 1659) HEMODYNAMICS:   VENTILATOR SETTINGS:   INTAKE / OUTPUT: Intake/Output    None     PHYSICAL EXAMINATION: General: Pleasant AA female, in NAD Neuro: A&O x 3, non-focal.  HEENT: Paint/AT. PERRL, + hirsutism. Cardiovascular: RRR, no M/R/G.  Lungs: Respirations shallow and rapid.  Coarse rhonchi primarily in right base. Abdomen: BS x 4, soft, NT/ND.  Musculoskeletal: No gross deformities, no edema.  Skin: Intact, warm, no rashes.  LABS:  CBC  Recent Labs Lab 05/20/14 1627  WBC 16.0*  HGB 11.7*  HCT 37.3  PLT 194   Coag's  Recent Labs Lab 05/20/14 1627  INR 1.15   BMET  Recent Labs Lab 05/20/14 1627  NA 131*  K 4.2  CL 95*  CO2 21  BUN 20  CREATININE 1.24*  GLUCOSE 227*   Electrolytes  Recent Labs Lab 05/20/14 1627  CALCIUM 8.5   Sepsis Markers  Recent Labs Lab 05/20/14 1629  LATICACIDVEN 3.31*   ABG  Recent Labs Lab 05/20/14 1648  PHART 7.360  PCO2ART 32.4*  PO2ART 88.6    Liver Enzymes  Recent Labs Lab 05/20/14 1627  AST 29  ALT 11  ALKPHOS 79  BILITOT 0.9  ALBUMIN 3.6   Cardiac Enzymes  Recent Labs Lab 05/20/14 1627  TROPONINI <0.03   Glucose  Recent Labs Lab 05/20/14 1619  GLUCAP 234*    Imaging Dg Chest Port 1 View  05/20/2014   CLINICAL DATA:  Fever for 4 days.  EXAM: PORTABLE CHEST - 1 VIEW  COMPARISON:  04/23/2005  FINDINGS: Patchy bilateral mid and lower lung zone airspace opacities have developed. Normal heart size. No pneumothorax.  IMPRESSION: Patchy bilateral airspace disease worrisome for pneumonia, ARDS, or pulmonary edema.   Electronically Signed   By: Jolaine Click M.D.   On: 05/20/2014 16:43     ASSESSMENT / PLAN:  PULMONARY A: Acute hypoxic respiratory failure Concern for CAP vs influenza Tobacco use disorder - pt reports she quit 2 weeks ago P:   Continue supplemental O2 as needed to maintain SpO2 > 92%. If deteriorates at  all, would attempt NIMV. If NIMV fails, pt and family are OK with short term mechanical ventilation. Levalbuterol PRN given tachycardia. Abx per ID section. CXR in AM.  CARDIOVASCULAR A:  Sinus tachycardia - likely due to hypovolemia from decreased PO intake Hx HTN, HLD P:  Additional 500cc bolus now (received ~ 2L in ED). Repeat troponin, lactate. Continue outpatient Simvastatin. Hold outpatient Ramipril.  RENAL A:   AKI - likely due to hypovolemia + ACE inhibitor Hyponatremia P:   NS @ 75 cc/hr. Hold outpatient ACEI. BMP in AM.  GASTROINTESTINAL A:   Nutrition P:   NPO except meds for now in case needs NIMV vs MV.  HEMATOLOGIC A:   Mild anemia VTE Prophylaxis P:  Transfuse for Hgb < 7. SCD's / Heparin. CBC in AM.  INFECTIOUS A:   Concern for CAP vs Influenza P:   BCx2 3/15 >  UCx 3/15 > Sputum Cx 3/15 >  Flu 3/15 > U. Legionella 3/15 > U. Strep 3/15 > Abx: Ceftriaxone, start date 3/15, day 1/x. Abx: Azithromycin, start date 3/15, day 1/x. Abx:  Tamiflu, start date 3/15, day 1/x. Acetaminophen PRN fever. PCT algorithm to limit abx exposure.  ENDOCRINE A:   DM Hypothyroidism P:   CBG's q4hr. SSI. Continue outpatient Synthroid. Check TSH. Hold outpatient Lantus, Janumet.  NEUROLOGIC A:   Anxiety ? chronic pain - on outpatient amitriptyline and norco P:   Low dose morphine PRN for anxiolysis + analgesia + tachypnea. Hold outpatient amitriptyline, norco.   Family updated: Husband, Son, Daughter in Social workerlaw at bedside.  Interdisciplinary Family Meeting v Palliative Care Meeting:  Due by: 3/21.   Rutherford Guysahul Desai, [place] - [facility] Pager: [phone]  or [phone] 05/20/2014, 7:47 PM

## 2014-05-20 NOTE — Progress Notes (Signed)
RN calling about  1. Poor lactate clearance - now 2s  2. HR 140s-130s  3. FEbrile still 103F 4. REsp distress -  Moderate persisseent   Variable  0 Points 1 Point 2 Points Total  Heart rate per minute  <90 beats 90-109 beats >110 beats 2  Respiratory  Rate per minute < 18 breaths 19-30 breaths  >30 breaths 2  Restlessness; nonpurposeful movements None  occas slight movement Frequent movement 0  Paradoxical breathing pattern: None  Present 0  Accessory muscle use: rise in clavicle during inspiration None Slight rise Pronounced rise 1  Grunting at end-expiration: guttural sound None  Present 0  Nasal flaring: involuntary movement of nares None  Present 2  Look of fear None  Eyes wide 0  Overall total out of 16    6   Plan 500cc fluid bolus Control fever CXR stat STart bipap  Intubate if worsens  Dr. Kalman ShanMurali Ziquan Fidel, M.D., [facility] Pulmonary and Critical Care Medicine Staff Physician [facility] [place] Pulmonary and Critical Care Pager: [phone], If no answer or between  15:00h - 7:00h: call [phone]  05/20/2014 11:50 PM

## 2014-05-20 NOTE — ED Notes (Signed)
Pt c/o fever and generalized body aches x 4 days.  Pain score 4/10.  Hx DM.  Pt's husband reports that the Pt has not been eating or taking insulin as prescribed.

## 2014-05-20 NOTE — Progress Notes (Signed)
ANTIBIOTIC CONSULT NOTE - INITIAL  Pharmacy Consult for Ceftriaxone & Azithromycin Indication: pneumonia  No Known Allergies  Patient Measurements: Weight: 160 lb (72.576 kg)  Vital Signs: Temp: 98.5 F (36.9 C) (03/15 1853) Temp Source: Oral (03/15 1853) BP: 118/57 mmHg (03/15 1900) Pulse Rate: 129 (03/15 1845) Intake/Output from previous day:   Intake/Output from this shift:    Labs:  Recent Labs  05/20/14 1627  WBC 16.0*  HGB 11.7*  PLT 194  CREATININE 1.24*   CrCl cannot be calculated (Unknown ideal weight.). No results for input(s): VANCOTROUGH, VANCOPEAK, VANCORANDOM, GENTTROUGH, GENTPEAK, GENTRANDOM, TOBRATROUGH, TOBRAPEAK, TOBRARND, AMIKACINPEAK, AMIKACINTROU, AMIKACIN in the last 72 hours.   Microbiology: No results found for this or any previous visit (from the past 720 hour(s)).  Medical History: Past Medical History  Diagnosis Date  . Diabetes mellitus   . Hypothyroidism   . Hypertension   . Hyperlipidemia     Medications:  Scheduled:  . [START ON 05/21/2014] aspirin EC  81 mg Oral Daily  . heparin  5,000 Units Subcutaneous 3 times per day  . insulin aspart  0-15 Units Subcutaneous 6 times per day  . [START ON 05/21/2014] levothyroxine  150 mcg Oral QAC breakfast  . oseltamivir  75 mg Oral BID  . simvastatin  20 mg Oral QPM   Infusions:  . sodium chloride    . sodium chloride    . sodium chloride 1,000 mL (05/20/14 1850)  . sodium chloride     Assessment:  [age] yr female with fever (Tmax 103.64F) and body aches, decreased appetite.  H/O DM  Patient received Vancomycin 1gm x 1 and Zosyn 3.375gm x 1 in the ED  Upon admitting, CCM plans to adjust antibiotic regimen to Ceftriaxone & Azithromycin to treat pneumonia.  Continue Tamiflu  Blood, urine and sputum cultures ordered  Goal of Therapy:  Eradication of infection  Plan:   Ceftriaxone 1gm IV q24h  Azithromycin 500mg  IV q24h  F/U cultures and sensitivities  Myna Freimark, Joselyn GlassmanLeann  Trefz, PharmD 05/20/2014,7:45 PM

## 2014-05-21 ENCOUNTER — Inpatient Hospital Stay (HOSPITAL_COMMUNITY): Payer: Commercial Managed Care - HMO

## 2014-05-21 DIAGNOSIS — A403 Sepsis due to Streptococcus pneumoniae: Secondary | ICD-10-CM

## 2014-05-21 DIAGNOSIS — J13 Pneumonia due to Streptococcus pneumoniae: Secondary | ICD-10-CM | POA: Diagnosis present

## 2014-05-21 DIAGNOSIS — J101 Influenza due to other identified influenza virus with other respiratory manifestations: Secondary | ICD-10-CM | POA: Diagnosis present

## 2014-05-21 DIAGNOSIS — J9601 Acute respiratory failure with hypoxia: Secondary | ICD-10-CM | POA: Insufficient documentation

## 2014-05-21 DIAGNOSIS — J80 Acute respiratory distress syndrome: Secondary | ICD-10-CM | POA: Diagnosis present

## 2014-05-21 DIAGNOSIS — A419 Sepsis, unspecified organism: Secondary | ICD-10-CM | POA: Diagnosis present

## 2014-05-21 LAB — BLOOD GAS, ARTERIAL
Acid-base deficit: 4.4 mmol/L — ABNORMAL HIGH (ref 0.0–2.0)
Acid-base deficit: 5.5 mmol/L — ABNORMAL HIGH (ref 0.0–2.0)
Bicarbonate: 19.2 mEq/L — ABNORMAL LOW (ref 20.0–24.0)
Bicarbonate: 19.8 mEq/L — ABNORMAL LOW (ref 20.0–24.0)
Drawn by: 257701
Drawn by: 257701
FIO2: 1 %
FIO2: 0.9 %
MECHVT: 450 mL
O2 Saturation: 99.6 %
O2 Saturation: 97.2 %
PCO2 ART: 31.6 mmHg — AB (ref 35.0–45.0)
PCO2 ART: 45.5 mmHg — AB (ref 35.0–45.0)
PEEP: 10 cmH2O
PEEP: 5 cmH2O
PH ART: 7.276 — AB (ref 7.350–7.450)
Patient temperature: 37
Patient temperature: 103
RATE: 32 resp/min
RATE: 35 resp/min
TCO2: 17.8 mmol/L (ref 0–100)
TCO2: 18.7 mmol/L (ref 0–100)
VT: 380 mL
pH, Arterial: 7.4 (ref 7.350–7.450)
pO2, Arterial: 344 mmHg — ABNORMAL HIGH (ref 80.0–100.0)
pO2, Arterial: 129 mmHg — ABNORMAL HIGH (ref 80.0–100.0)

## 2014-05-21 LAB — LEGIONELLA ANTIGEN, URINE

## 2014-05-21 LAB — INFLUENZA PANEL BY PCR (TYPE A & B)
H1N1 flu by pcr: DETECTED — AB
H1N1 flu by pcr: NOT DETECTED
INFLBPCR: NEGATIVE
INFLBPCR: NEGATIVE
Influenza A By PCR: POSITIVE — AB
Influenza A By PCR: NEGATIVE

## 2014-05-21 LAB — BASIC METABOLIC PANEL
Anion gap: 10 (ref 5–15)
BUN: 12 mg/dL (ref 6–23)
CO2: 22 mmol/L (ref 19–32)
Calcium: 7.5 mg/dL — ABNORMAL LOW (ref 8.4–10.5)
Chloride: 104 mmol/L (ref 96–112)
Creatinine, Ser: 0.74 mg/dL (ref 0.50–1.10)
GFR, EST NON AFRICAN AMERICAN: 87 mL/min — AB (ref >90)
GLUCOSE: 194 mg/dL — AB (ref 70–99)
POTASSIUM: 4.1 mmol/L (ref 3.5–5.1)
SODIUM: 136 mmol/L (ref 135–145)

## 2014-05-21 LAB — CBC
HCT: 36.9 % (ref 36.0–46.0)
Hemoglobin: 11.3 g/dL — ABNORMAL LOW (ref 12.0–15.0)
MCH: 26 pg (ref 26.0–34.0)
MCHC: 30.6 g/dL (ref 30.0–36.0)
MCV: 85 fL (ref 78.0–100.0)
PLATELETS: 186 10*3/uL (ref 150–400)
RBC: 4.34 MIL/uL (ref 3.87–5.11)
RDW: 13.9 % (ref 11.5–15.5)
WBC: 10.9 10*3/uL — ABNORMAL HIGH (ref 4.0–10.5)

## 2014-05-21 LAB — GLUCOSE, CAPILLARY
GLUCOSE-CAPILLARY: 140 mg/dL — AB (ref 70–99)
Glucose-Capillary: 125 mg/dL — ABNORMAL HIGH (ref 70–99)
Glucose-Capillary: 188 mg/dL — ABNORMAL HIGH (ref 70–99)
Glucose-Capillary: 191 mg/dL — ABNORMAL HIGH (ref 70–99)
Glucose-Capillary: 113 mg/dL — ABNORMAL HIGH (ref 70–99)
Glucose-Capillary: 176 mg/dL — ABNORMAL HIGH (ref 70–99)

## 2014-05-21 LAB — URINE CULTURE: Colony Count: 30000

## 2014-05-21 LAB — PHOSPHORUS: Phosphorus: 2.1 mg/dL — ABNORMAL LOW (ref 2.3–4.6)

## 2014-05-21 LAB — MAGNESIUM: Magnesium: 1.3 mg/dL — ABNORMAL LOW (ref 1.5–2.5)

## 2014-05-21 MED ORDER — FENTANYL CITRATE 0.05 MG/ML IJ SOLN: 50.0000 ug | Freq: Once | INTRAMUSCULAR | Status: AC

## 2014-05-21 MED ORDER — INSULIN ASPART 100 UNIT/ML ~~LOC~~ SOLN
0.0000 [IU] | SUBCUTANEOUS | Status: DC
  Administered 2014-05-21: 4 [IU] via SUBCUTANEOUS
  Administered 2014-05-21: 3 [IU] via SUBCUTANEOUS
  Administered 2014-05-22: 15 [IU] via SUBCUTANEOUS
  Administered 2014-05-22: 7 [IU] via SUBCUTANEOUS
  Administered 2014-05-22: 15 [IU] via SUBCUTANEOUS
  Administered 2014-05-22: 11 [IU] via SUBCUTANEOUS
  Administered 2014-05-22: 7 [IU] via SUBCUTANEOUS
  Administered 2014-05-22: 11 [IU] via SUBCUTANEOUS
  Administered 2014-05-23: 20 [IU] via SUBCUTANEOUS
  Administered 2014-05-23 (×2): 11 [IU] via SUBCUTANEOUS
  Administered 2014-05-23: 7 [IU] via SUBCUTANEOUS
  Administered 2014-05-23: 11 [IU] via SUBCUTANEOUS
  Administered 2014-05-23: 15 [IU] via SUBCUTANEOUS
  Administered 2014-05-24 – 2014-05-25 (×7): 11 [IU] via SUBCUTANEOUS
  Administered 2014-05-25: 15 [IU] via SUBCUTANEOUS
  Administered 2014-05-25: 7 [IU] via SUBCUTANEOUS
  Administered 2014-05-25 (×3): 11 [IU] via SUBCUTANEOUS
  Administered 2014-05-26: 15 [IU] via SUBCUTANEOUS
  Administered 2014-05-26: 7 [IU] via SUBCUTANEOUS
  Administered 2014-05-26: 3 [IU] via SUBCUTANEOUS
  Administered 2014-05-26: 15 [IU] via SUBCUTANEOUS
  Administered 2014-05-26: 7 [IU] via SUBCUTANEOUS
  Administered 2014-05-26: 4 [IU] via SUBCUTANEOUS
  Administered 2014-05-27 (×5): 7 [IU] via SUBCUTANEOUS
  Administered 2014-05-27: 15 [IU] via SUBCUTANEOUS
  Administered 2014-05-28: 11 [IU] via SUBCUTANEOUS
  Administered 2014-05-28: 4 [IU] via SUBCUTANEOUS
  Administered 2014-05-28: 3 [IU] via SUBCUTANEOUS
  Administered 2014-05-28: 4 [IU] via SUBCUTANEOUS
  Administered 2014-05-28: 7 [IU] via SUBCUTANEOUS
  Administered 2014-05-29: 4 [IU] via SUBCUTANEOUS
  Administered 2014-05-29 (×4): 7 [IU] via SUBCUTANEOUS
  Administered 2014-05-30: 11 [IU] via SUBCUTANEOUS
  Administered 2014-05-30 (×2): 7 [IU] via SUBCUTANEOUS
  Administered 2014-05-30 (×2): 4 [IU] via SUBCUTANEOUS
  Administered 2014-05-30: 11 [IU] via SUBCUTANEOUS
  Administered 2014-05-31 (×3): 7 [IU] via SUBCUTANEOUS
  Administered 2014-05-31: 11 [IU] via SUBCUTANEOUS
  Administered 2014-05-31: 7 [IU] via SUBCUTANEOUS
  Administered 2014-05-31: 11 [IU] via SUBCUTANEOUS
  Administered 2014-06-01 (×2): 7 [IU] via SUBCUTANEOUS
  Administered 2014-06-01 (×2): 4 [IU] via SUBCUTANEOUS
  Administered 2014-06-01 – 2014-06-02 (×4): 7 [IU] via SUBCUTANEOUS
  Administered 2014-06-02 (×2): 4 [IU] via SUBCUTANEOUS
  Administered 2014-06-02 (×2): 7 [IU] via SUBCUTANEOUS
  Administered 2014-06-03 (×3): 4 [IU] via SUBCUTANEOUS
  Administered 2014-06-03: 3 [IU] via SUBCUTANEOUS
  Administered 2014-06-03: 11 [IU] via SUBCUTANEOUS
  Administered 2014-06-03 – 2014-06-04 (×2): 7 [IU] via SUBCUTANEOUS
  Administered 2014-06-04 (×2): 4 [IU] via SUBCUTANEOUS
  Administered 2014-06-04: 3 [IU] via SUBCUTANEOUS
  Administered 2014-06-05: 7 [IU] via SUBCUTANEOUS
  Administered 2014-06-05: 15 [IU] via SUBCUTANEOUS
  Administered 2014-06-05: 4 [IU] via SUBCUTANEOUS
  Administered 2014-06-05: 7 [IU] via SUBCUTANEOUS
  Administered 2014-06-05: 11 [IU] via SUBCUTANEOUS
  Administered 2014-06-06 (×5): 4 [IU] via SUBCUTANEOUS
  Administered 2014-06-06 – 2014-06-07 (×2): 7 [IU] via SUBCUTANEOUS
  Administered 2014-06-07: 4 [IU] via SUBCUTANEOUS
  Administered 2014-06-07 (×2): 7 [IU] via SUBCUTANEOUS
  Administered 2014-06-07: 3 [IU] via SUBCUTANEOUS
  Administered 2014-06-07 – 2014-06-08 (×3): 4 [IU] via SUBCUTANEOUS
  Administered 2014-06-08: 3 [IU] via SUBCUTANEOUS
  Administered 2014-06-08 – 2014-06-09 (×4): 4 [IU] via SUBCUTANEOUS
  Administered 2014-06-09: 11 [IU] via SUBCUTANEOUS
  Administered 2014-06-09: 4 [IU] via SUBCUTANEOUS
  Administered 2014-06-09: 7 [IU] via SUBCUTANEOUS
  Administered 2014-06-09: 4 [IU] via SUBCUTANEOUS
  Administered 2014-06-09: 7 [IU] via SUBCUTANEOUS
  Administered 2014-06-10: 4 [IU] via SUBCUTANEOUS
  Administered 2014-06-10: 3 [IU] via SUBCUTANEOUS
  Administered 2014-06-10: 4 [IU] via SUBCUTANEOUS
  Administered 2014-06-10: 3 [IU] via SUBCUTANEOUS
  Administered 2014-06-10: 5 [IU] via SUBCUTANEOUS
  Administered 2014-06-11: 3 [IU] via SUBCUTANEOUS
  Administered 2014-06-11 (×2): 4 [IU] via SUBCUTANEOUS
  Administered 2014-06-12 (×2): 3 [IU] via SUBCUTANEOUS
  Filled 2014-05-21: qty 0.2

## 2014-05-21 MED ORDER — ENOXAPARIN SODIUM 40 MG/0.4ML ~~LOC~~ SOLN
40.0000 mg | SUBCUTANEOUS | Status: DC
  Administered 2014-05-21 – 2014-05-28 (×8): 40 mg via SUBCUTANEOUS
  Filled 2014-05-21 (×8): qty 0.4

## 2014-05-21 MED ORDER — DEXTROSE 5 % IV SOLN
2.0000 ug/min | INTRAVENOUS | Status: DC
  Administered 2014-05-21: 5 ug/min via INTRAVENOUS
  Administered 2014-05-22: 7 ug/min via INTRAVENOUS
  Administered 2014-05-22 – 2014-05-23 (×2): 4 ug/min via INTRAVENOUS
  Administered 2014-05-24: 3 ug/min via INTRAVENOUS
  Administered 2014-05-24: 1 ug/min via INTRAVENOUS
  Administered 2014-05-24: 2 ug/min via INTRAVENOUS
  Administered 2014-05-25: 3 ug/min via INTRAVENOUS
  Filled 2014-05-21 (×7): qty 4

## 2014-05-21 MED ORDER — SODIUM CHLORIDE 0.9 % IV BOLUS (SEPSIS)
1000.0000 mL | Freq: Once | INTRAVENOUS | Status: AC
  Administered 2014-05-21: 1000 mL via INTRAVENOUS

## 2014-05-21 MED ORDER — LIDOCAINE HCL (CARDIAC) 20 MG/ML IV SOLN
INTRAVENOUS | Status: AC
  Filled 2014-05-21: qty 5

## 2014-05-21 MED ORDER — ETOMIDATE 2 MG/ML IV SOLN
INTRAVENOUS | Status: AC
  Filled 2014-05-21: qty 20

## 2014-05-21 MED ORDER — OSELTAMIVIR PHOSPHATE 6 MG/ML PO SUSR
75.0000 mg | Freq: Two times a day (BID) | ORAL | Status: DC
  Administered 2014-05-21 – 2014-05-24 (×6): 75 mg
  Filled 2014-05-21 (×14): qty 12.5

## 2014-05-21 MED ORDER — MIDAZOLAM HCL 10 MG/2ML IJ SOLN: 1.0000 mg | INTRAMUSCULAR | Status: DC | PRN

## 2014-05-21 MED ORDER — PANTOPRAZOLE SODIUM 40 MG PO PACK
40.0000 mg | PACK | ORAL | Status: DC
Start: 2014-05-21 — End: 2014-06-12
  Administered 2014-05-21 – 2014-06-11 (×21): 40 mg
  Filled 2014-05-21 (×24): qty 20

## 2014-05-21 MED ORDER — FENTANYL BOLUS VIA INFUSION
25.0000 ug | INTRAVENOUS | Status: DC | PRN
  Filled 2014-05-21: qty 25

## 2014-05-21 MED ORDER — VITAL AF 1.2 CAL PO LIQD
1000.0000 mL | ORAL | Status: DC
  Administered 2014-05-21 – 2014-05-30 (×10): 1000 mL
  Filled 2014-05-21 (×15): qty 1000

## 2014-05-21 MED ORDER — SUCCINYLCHOLINE CHLORIDE 20 MG/ML IJ SOLN
INTRAMUSCULAR | Status: AC
  Filled 2014-05-21: qty 1

## 2014-05-21 MED ORDER — SUCCINYLCHOLINE CHLORIDE 20 MG/ML IJ SOLN
INTRAMUSCULAR | Status: AC
  Administered 2014-05-21: 80 mg
  Filled 2014-05-21: qty 1

## 2014-05-21 MED ORDER — MIDAZOLAM HCL 2 MG/2ML IJ SOLN
INTRAMUSCULAR | Status: AC
  Administered 2014-05-21: 2 mg
  Filled 2014-05-21: qty 4

## 2014-05-21 MED ORDER — CETYLPYRIDINIUM CHLORIDE 0.05 % MT LIQD: 7.0000 mL | Freq: Four times a day (QID) | OROMUCOSAL | Status: DC

## 2014-05-21 MED ORDER — ACETAMINOPHEN 160 MG/5ML PO SOLN: 650.0000 mg | Freq: Four times a day (QID) | ORAL | Status: DC | PRN

## 2014-05-21 MED ORDER — ACETAMINOPHEN 160 MG/5ML PO SOLN
650.0000 mg | ORAL | Status: DC | PRN
  Administered 2014-05-21 – 2014-06-09 (×26): 650 mg
  Filled 2014-05-21 (×27): qty 20.3

## 2014-05-21 MED ORDER — INSULIN ASPART 100 UNIT/ML ~~LOC~~ SOLN: 0.0000 [IU] | Freq: Three times a day (TID) | SUBCUTANEOUS | Status: DC

## 2014-05-21 MED ORDER — LEVOTHYROXINE SODIUM 150 MCG PO TABS
150.0000 ug | ORAL_TABLET | Freq: Every day | ORAL | Status: DC
  Administered 2014-05-22 – 2014-06-13 (×22): 150 ug
  Filled 2014-05-21 (×20): qty 2
  Filled 2014-05-21: qty 1
  Filled 2014-05-21: qty 2
  Filled 2014-05-21: qty 1
  Filled 2014-05-21: qty 2

## 2014-05-21 MED ORDER — CHLORHEXIDINE GLUCONATE 0.12 % MT SOLN
15.0000 mL | Freq: Two times a day (BID) | OROMUCOSAL | Status: DC
  Administered 2014-05-21 – 2014-06-13 (×47): 15 mL via OROMUCOSAL
  Filled 2014-05-21 (×49): qty 15

## 2014-05-21 MED ORDER — INSULIN ASPART 100 UNIT/ML ~~LOC~~ SOLN: 0.0000 [IU] | Freq: Every day | SUBCUTANEOUS | Status: DC

## 2014-05-21 MED ORDER — ROCURONIUM BROMIDE 50 MG/5ML IV SOLN
INTRAVENOUS | Status: AC
  Filled 2014-05-21: qty 2

## 2014-05-21 MED ORDER — FENTANYL CITRATE 0.05 MG/ML IJ SOLN
INTRAMUSCULAR | Status: AC
  Administered 2014-05-21: 100 ug
  Filled 2014-05-21: qty 4

## 2014-05-21 MED ORDER — CETYLPYRIDINIUM CHLORIDE 0.05 % MT LIQD
7.0000 mL | Freq: Four times a day (QID) | OROMUCOSAL | Status: DC
Start: 2014-05-21 — End: 2014-06-13
  Administered 2014-05-21 – 2014-06-13 (×87): 7 mL via OROMUCOSAL

## 2014-05-21 MED ORDER — ROCURONIUM BROMIDE 50 MG/5ML IV SOLN
INTRAVENOUS | Status: AC
  Administered 2014-05-21: 50 mg
  Filled 2014-05-21: qty 2

## 2014-05-21 MED ORDER — PROPOFOL 10 MG/ML IV EMUL
5.0000 ug/kg/min | INTRAVENOUS | Status: DC
  Administered 2014-05-21: 10 ug/kg/min via INTRAVENOUS
  Administered 2014-05-22 (×2): 20 ug/kg/min via INTRAVENOUS
  Administered 2014-05-22: 40 ug/kg/min via INTRAVENOUS
  Administered 2014-05-22: 20 ug/kg/min via INTRAVENOUS
  Administered 2014-05-23: 35 ug/kg/min via INTRAVENOUS
  Administered 2014-05-23: 20 ug/kg/min via INTRAVENOUS
  Administered 2014-05-24 (×2): 25 ug/kg/min via INTRAVENOUS
  Administered 2014-05-24 (×2): 35 ug/kg/min via INTRAVENOUS
  Administered 2014-05-24 – 2014-05-25 (×2): 25 ug/kg/min via INTRAVENOUS
  Administered 2014-05-25: 15 ug/kg/min via INTRAVENOUS
  Administered 2014-05-26: 5 ug/kg/min via INTRAVENOUS
  Filled 2014-05-21 (×14): qty 100

## 2014-05-21 MED ORDER — CHLORHEXIDINE GLUCONATE 0.12 % MT SOLN: 15.0000 mL | Freq: Two times a day (BID) | OROMUCOSAL | Status: DC

## 2014-05-21 MED ORDER — SODIUM CHLORIDE 0.9 % IV SOLN
25.0000 ug/h | INTRAVENOUS | Status: DC
  Administered 2014-05-21: 75 ug/h via INTRAVENOUS
  Administered 2014-05-22: 150 ug/h via INTRAVENOUS
  Administered 2014-05-22 (×2): 200 ug/h via INTRAVENOUS
  Filled 2014-05-21 (×3): qty 50

## 2014-05-21 MED ORDER — VITAL HIGH PROTEIN PO LIQD: 1000.0000 mL | ORAL | Status: DC

## 2014-05-21 MED ORDER — ETOMIDATE 2 MG/ML IV SOLN
INTRAVENOUS | Status: AC
  Administered 2014-05-21: 10 mg
  Filled 2014-05-21: qty 20

## 2014-05-21 MED ORDER — LEVOTHYROXINE SODIUM 100 MCG IV SOLR: 75.0000 ug | Freq: Every day | INTRAVENOUS | Status: DC

## 2014-05-21 NOTE — Progress Notes (Signed)
caemera re-round  cxr worse On bipap - looks some better  Plan [facility]  Dr. Kalman ShanMurali Mckaylie Vasey, M.D., [facility].P Pulmonary and Critical Care Medicine Staff Physician [facility] Pulmonary and Critical Care Pager: [phone], If no answer or between  15:00h - 7:00h: call [phone]  05/21/2014 5:08 AM

## 2014-05-21 NOTE — Procedures (Signed)
Intubation Procedure Note Grace Mitchell [id number] 1945-10-21  Procedure: Intubation Indications: Respiratory insufficiency  Procedure Details Consent: Risks of procedure as well as the alternatives and risks of each were explained to the (patient/caregiver).  Consent for procedure obtained. Time Out: Verified patient identification, verified procedure, site/side was marked, verified correct patient position, special equipment/implants available, medications/allergies/relevent history reviewed, required imaging and test results available.  Performed  MAC and 3 Medications:  Fentanyl 100 mcg Etomidate 10 mg Versed 2 mg Succinylcholine 80 mg  Inserted 7.5 ETT to 23 cm.   Evaluation Hemodynamic Status: BP stable throughout; O2 sats: transiently fell Patient's Current Condition: stable Complications: No apparent complications Patient did tolerate procedure well. Chest X-ray ordered to verify placement.  CXR: pending. 7.5 OTT placed without problem. =BBSH, + ETCO2 changes.  Brett CanalesSteve Minor ACNP [facility] Pager [phone] till 3 pm If no answer page [phone] 05/21/2014, 12:24 PM  I performed procedure.  Coralyn HellingVineet Elleen Coulibaly, MD [facility]Critical [facility] 05/21/2014, 12:54 PM Pager:  [phone] After 3pm call: [phone]

## 2014-05-21 NOTE — Progress Notes (Signed)
PHARMACY CONSULT: Lovenox for VTE prophylaxis   Patient received Heparin 5000 units SQ at 0537 today.  Wt: 76.4 kg BMI:  29 Scr:  0.74 CrCl >30 ml/hr  H/H: 11.3/36.9 Pltc: 186  A/P:  1.  Lovenox 40 mg SQ q24h. 2.  Monitor CBC and renal function, adjust as needed  Clance BollAmanda Tomoya Ringwald, PharmD, BCPS Pager: [phone] 05/21/2014 8:20 AM

## 2014-05-21 NOTE — Progress Notes (Signed)
Per McQuaid leave PT at 7cc VT at this time.

## 2014-05-21 NOTE — Progress Notes (Signed)
CARE MANAGEMENT NOTE 05/21/2014  Patient:  Jackquline BoschRICH,Diannah K   Account Number:  [id number]  Date Initiated:  05/21/2014  Documentation initiated by:  DAVIS,RHONDA  Subjective/Objective Assessment:   sepsis and influenza     Action/Plan:   home when stable   Anticipated DC Date:  05/24/2014   Anticipated DC Plan:  HOME/SELF CARE  In-house referral  NA      DC Planning Services  NA      [facility]  [facility]   Choice offered to / List presented to:             Status of service:  In process, will continue to follow Medicare Important Message given?   (If response is "NO", the following Medicare IM given date fields will be blank) Date Medicare IM given:   Medicare IM given by:   Date Additional Medicare IM given:   Additional Medicare IM given by:    Discharge Disposition:    Per UR Regulation:  Reviewed for med. necessity/level of care/duration of stay  If discussed at Long Length of Stay Meetings, dates discussed:    Comments:  May 21, 2014/Rhonda L. Earlene Plateravis, RN, BSN, CCM. Case Management [facility] [employer] [phone] No discharge needs present of time of review.

## 2014-05-21 NOTE — Progress Notes (Signed)
Perr MD- RT removed PT from BiPAP to 100% NRB- RN aware. Sp02 96%. PT does not appear to be in respiratory distress at this time.

## 2014-05-21 NOTE — Procedures (Signed)
Arterial Catheter Insertion Procedure Note Grace BoschLinda K Mitchell [id number] Dec 09, 1945  Procedure: Insertion of Arterial Catheter  Indications: Blood pressure monitoring and Frequent blood sampling  Procedure Details Consent: Risks of procedure as well as the alternatives and risks of each were explained to the (patient/caregiver).  Consent for procedure obtained. Time Out: Verified patient identification, verified procedure, site/side was marked, verified correct patient position, special equipment/implants available, medications/allergies/relevent history reviewed, required imaging and test results available.  Performed  Maximum sterile technique was used including antiseptics, cap, gloves, gown, hand hygiene, mask and sheet. Skin prep: Chlorhexidine; local anesthetic administered 20 gauge catheter was inserted into right radial artery using the Seldinger technique.  Evaluation Blood flow good; BP tracing good. Complications: No apparent complications.   Dairl PonderWalters, Daniyla Pfahler Nannette 05/21/2014

## 2014-05-21 NOTE — Progress Notes (Signed)
Notified Dr. Marchelle Gearingamaswamy of increased heart rate, increased respiration, and of Lactic acid of 2.5., new orders noted and received.

## 2014-05-21 NOTE — Progress Notes (Signed)
RT placed PT on BiPAP (previous setting with 75% Fi02)- due to increased RR, HR, and low Spo2. RN aware.

## 2014-05-21 NOTE — Progress Notes (Signed)
INITIAL NUTRITION ASSESSMENT  DOCUMENTATION CODES Per approved criteria  -Not Applicable   INTERVENTION: - Initiate Vital AF 1.2 @ 20 ml/hr via OGT and increase by 10 ml every 4 hours to goal rate of 65 ml/hr.   Tube feeding regimen with calories from Propofol provides 2115 kcal (104% of needs), 117 grams of protein, and 1264 ml of H2O.   NUTRITION DIAGNOSIS: Inadequate oral intake related to inability to eat as evidenced by NPO.   Goal: Pt to meet >/= 90% of their estimated nutrition needs   Monitor:  Weight trend, initiation and toleration of TF, labs, vent status/settings  Reason for Assessment: Consult for TF initiation and management, Malnutrition Screening Tool  69 y.o. female  Admitting Dx: <principal problem not specified>  ASSESSMENT: 69 y.o. F brought to [facility] ED 3/15 with 4 day hx of generalized body aches, SOB, subjective fevers, chills. In ED, had fever to 103, was placed on NRB for hypoxia and had RR in high 30's. PCCM called for admission due to concern for potential of deterioration.   - Pt intubated 3/16 - Spoke with husband who reports that pt as eating well until she started to get sick about 2 weeks ago. No significant weight loss. Pt's usual body weight is 165 lbs.  - No signs of fat or muscle wasting.   Patient is currently intubated on ventilator support MV: 13.2 L/min Temp (24hrs), Avg:100.1 F (37.8 C), Min:98.5 F (36.9 C), Max:103.1 F (39.5 C)  Propofol: 9.2 ml/hr providing additional 243 kcal- Per RN this rate is subject to change. RD will follow up tomorrow.   Labs reviewed  Height: Ht Readings from Last 1 Encounters:  05/20/14  (1.626 m)    Weight: Wt Readings from Last 1 Encounters:  05/21/14 168 lb 6.9 oz (76.4 kg)    Ideal Body Weight: 54.7 kg  % Ideal Body Weight: 140%  Wt Readings from Last 10 Encounters:  05/21/14 168 lb 6.9 oz (76.4 kg)    Usual Body Weight: 165 lbs  % Usual Body Weight: 102%  BMI:  Body mass  index is 28.9 kg/(m^2).  Estimated Nutritional Needs: Kcal: 2031 Protein: 100-115 g Fluid: 2.0 L/day  Skin: intact  Diet Order: Diet NPO time specified  EDUCATION NEEDS: -Education not appropriate at this time   Intake/Output Summary (Last 24 hours) at 05/21/14 1447 Last data filed at 05/21/14 1000  Gross per 24 hour  Intake 4375.42 ml  Output    925 ml  Net 3450.42 ml    Last BM: prior to admission   Labs:   Recent Labs Lab 05/20/14 1627 05/21/14 0345  NA 131* 136  K 4.2 4.1  CL 95* 104  CO2 21 22  BUN 20 12  CREATININE 1.24* 0.74  CALCIUM 8.5 7.5*  MG  --  1.3*  PHOS  --  2.1*  GLUCOSE 227* 194*    CBG (last 3)   Recent Labs  05/21/14 0342 05/21/14 0840 05/21/14 1329  GLUCAP 188* 125* 113*    Scheduled Meds: . antiseptic oral rinse  7 mL Mouth Rinse QID  . aspirin EC  81 mg Oral Daily  . azithromycin  500 mg Intravenous Q24H  . cefTRIAXone (ROCEPHIN)  IV  1 g Intravenous Q24H  . chlorhexidine  15 mL Mouth Rinse BID  . enoxaparin (LOVENOX) injection  40 mg Subcutaneous Q24H  . insulin aspart  0-20 Units Subcutaneous 6 times per day  . [START ON 05/22/2014] levothyroxine  150 mcg  Per Tube QAC breakfast  . oseltamivir  75 mg Per Tube Q12H  . pantoprazole sodium  40 mg Per Tube Q24H  . rocuronium      . sodium chloride  1,000 mL Intravenous Once    Continuous Infusions: . sodium chloride 75 mL/hr at 05/21/14 0641  . fentaNYL infusion INTRAVENOUS 100 mcg/hr (05/21/14 1333)  . norepinephrine (LEVOPHED) Adult infusion    . propofol 10 mcg/kg/min (05/21/14 1347)    Past Medical History  Diagnosis Date  . Diabetes mellitus   . Hypothyroidism   . Hypertension   . Hyperlipidemia     Past Surgical History  Procedure Laterality Date  . Back surgery      Emmaline KluverHaley Jerrianne Hartin MS, RD, LDN [phone]

## 2014-05-21 NOTE — Progress Notes (Signed)
RT collected sputum sample per MD order- left for RN in PT room on bedside table per RN request.

## 2014-05-21 NOTE — Procedures (Signed)
Intubation Procedure Note Jackquline BoschLinda K Esquibel [id number] [birth year]  Procedure: Intubation Indications: Respiratory insufficiency  Procedure Details Consent: Risks of procedure as well as the alternatives and risks of each were explained to the (patient/caregiver).  Consent for procedure obtained. Time Out: Verified patient identification, verified procedure, site/side was marked, verified correct patient position, special equipment/implants available, medications/allergies/relevent history reviewed, required imaging and test results available.  Performed  Maximum sterile technique was used including gloves, hand hygiene and mask.  3    Evaluation Hemodynamic Status: BP stable throughout; O2 sats: transiently fell during during procedure and currently acceptable Patient's Current Condition: stable Complications: No apparent complications Patient did tolerate procedure well. Chest X-ray ordered to verify placement.  CXR: pending.   Dairl PonderWalters, Ilias Stcharles Nannette 05/21/2014

## 2014-05-21 NOTE — Procedures (Signed)
Central Venous Catheter Insertion Procedure Note Grace BoschLinda K Mitchell [id number] Sep 26, 1945  Procedure: Insertion of Central Venous Catheter Indications: Assessment of intravascular volume, Drug and/or fluid administration and Frequent blood sampling  Procedure Details Consent: Risks of procedure as well as the alternatives and risks of each were explained to the (patient/caregiver).  Consent for procedure obtained. Time Out: Verified patient identification, verified procedure, site/side was marked, verified correct patient position, special equipment/implants available, medications/allergies/relevent history reviewed, required imaging and test results available.  Performed  Maximum sterile technique was used including antiseptics, cap, gloves, gown, hand hygiene, mask and sheet. Skin prep: Chlorhexidine; local anesthetic administered A antimicrobial bonded/coated triple lumen catheter was placed in the left internal jugular vein using the Seldinger technique. Ultrasound guidance used.Yes.   Catheter placed to 20 cm. Blood aspirated via all 3 ports and then flushed x 3. Line sutured x 2 and dressing applied.  Evaluation Blood flow good Complications: No apparent complications Patient did tolerate procedure well. Chest X-ray ordered to verify placement.  CXR: pending.  Brett CanalesSteve Minor ACNP [facility] Pager [phone] till 3 pm If no answer page [phone] 05/21/2014, 2:29 PM   Coralyn HellingVineet Abshir Paolini, MD [facility] Pulmonary/Critical Care 05/21/2014, 4:33 PM Pager:  [phone] After 3pm call: [phone]

## 2014-05-21 NOTE — Progress Notes (Signed)
PULMONARY / CRITICAL CARE MEDICINE   Name: Grace Mitchell MRN: [id number] DOB: Oct 03, 1945    ADMISSION DATE:  05/20/2014 CONSULTATION DATE:  05/21/2014  REFERRING MD :  EDP  CHIEF COMPLAINT:  Body aches, SOB, chills, fever  INITIAL PRESENTATION:  69 yo female smoker with dyspnea, fever (Tm103F), chills, malaise from PNA and sepsis.  STUDIES:   SIGNIFICANT EVENTS: 3/15 admit  SUBJECTIVE:   Feels better.  Still has chest congestion.  VITAL SIGNS: Temp:  [98.5 F (36.9 C)-103.1 F (39.5 C)] 101 F (38.3 C) (03/16 0400) Pulse Rate:  [38-150] 129 (03/16 0500) Resp:  [23-44] 35 (03/16 0500) BP: (110-178)/(53-115) 110/55 mmHg (03/16 0500) SpO2:  [77 %-100 %] 98 % (03/16 0500) Weight:  [160 lb (72.576 kg)-168 lb 6.9 oz (76.4 kg)] 168 lb 6.9 oz (76.4 kg) (03/16 0400) INTAKE / OUTPUT: Intake/Output      03/15 0701 - 03/16 0700 03/16 0701 - 03/17 0700   I.V. (mL/kg) 538.8 (7.1)    IV Piggyback 3516.7    Total Intake(mL/kg) 4055.4 (53.1)    Urine (mL/kg/hr) 800    Total Output 800     Net +3255.4          Urine Occurrence 1 x      PHYSICAL EXAMINATION: General: Pleasant  Neuro: normal strength HEENT: BiPAP mask in place Cardiovascular: regular, tachycardic Lungs: b/l rhonchi Abdomen: soft, non tender Musculoskeletal: no edema Skin: no rashes  LABS:  CBC  Recent Labs Lab 05/20/14 1627 05/21/14 0345  WBC 16.0* 10.9*  HGB 11.7* 11.3*  HCT 37.3 36.9  PLT 194 186   Coag's  Recent Labs Lab 05/20/14 1627  INR 1.15   BMET  Recent Labs Lab 05/20/14 1627 05/21/14 0345  NA 131* 136  K 4.2 4.1  CL 95* 104  CO2 21 22  BUN 20 12  CREATININE 1.24* 0.74  GLUCOSE 227* 194*   Electrolytes  Recent Labs Lab 05/20/14 1627 05/21/14 0345  CALCIUM 8.5 7.5*  MG  --  1.3*  PHOS  --  2.1*   Sepsis Markers  Recent Labs Lab 05/20/14 1629 05/20/14 2140  LATICACIDVEN 3.31* 2.5*  PROCALCITON  --  6.94   ABG  Recent Labs Lab 05/20/14 1648  PHART 7.360   PCO2ART 32.4*  PO2ART 88.6   Liver Enzymes  Recent Labs Lab 05/20/14 1627  AST 29  ALT 11  ALKPHOS 79  BILITOT 0.9  ALBUMIN 3.6   Cardiac Enzymes  Recent Labs Lab 05/20/14 1627 05/20/14 2140  TROPONINI <0.03 0.03   Glucose  Recent Labs Lab 05/20/14 1619 05/20/14 2114  GLUCAP 234* 200*    Imaging Dg Chest Port 1 View  05/21/2014   CLINICAL DATA:  Respiratory failure  EXAM: PORTABLE CHEST - 1 VIEW  COMPARISON:  05/20/2014  FINDINGS: Dense consolidation persists bilaterally without significant interval change. No large effusion is evident.  IMPRESSION: Persistent dense consolidation bilaterally.   Electronically Signed   By: Ellery Plunk M.D.   On: 05/21/2014 07:03   Dg Chest Port 1 View  05/21/2014   CLINICAL DATA:  Worsening shortness of breath and decreased O2 saturation. Initial encounter.  EXAM: PORTABLE CHEST - 1 VIEW  COMPARISON:  Chest radiograph performed earlier today at 4:36 p.m.  FINDINGS: Significantly worsened diffuse bilateral airspace opacification may reflect diffuse pneumonia, pulmonary edema or ARDS. No pleural effusion or pneumothorax is seen.  The cardiomediastinal silhouette is borderline normal in size. No acute osseous abnormalities are identified.  IMPRESSION: Significantly worsened diffuse  bilateral airspace opacification may reflect diffuse pneumonia, pulmonary edema or ARDS.   Electronically Signed   By: Roanna RaiderJeffery  Chang M.D.   On: 05/21/2014 01:18   Dg Chest Port 1 View  05/20/2014   CLINICAL DATA:  Fever for 4 days.  EXAM: PORTABLE CHEST - 1 VIEW  COMPARISON:  04/23/2005  FINDINGS: Patchy bilateral mid and lower lung zone airspace opacities have developed. Normal heart size. No pneumothorax.  IMPRESSION: Patchy bilateral airspace disease worrisome for pneumonia, ARDS, or pulmonary edema.   Electronically Signed   By: Jolaine ClickArthur  Hoss M.D.   On: 05/20/2014 16:43     ASSESSMENT / PLAN:  PULMONARY A: Acute hypoxic respiratory failure 2nd to  PNA. Tobacco abuse P:   Oxygen to keep SpO2 > 92% BiPAP prn F/u CXR PRN BD's  CARDIOVASCULAR A:  Sepsis 2nd to PNA. Hx HTN, HLD. P:  Continue IV fluids Continue outpatient Simvastatin Hold outpatient Ramipril  RENAL A:   AKI from sepsis >> improved. P:   Monitor renal fx  GASTROINTESTINAL A:   Nutrition. P:   Advance diet if she can tolerate being off BiPAP  HEMATOLOGIC A:   Leukocytosis. P:  F/u CBC Lovenox for DVT prevention  INFECTIOUS A:   Pneumonia with pneumococcal Ag positive. P:   Day 2 rocephin, zithromax, tamiflu  Pneumococcal Ag 3/15 >> positive Legionella Ag 3/15 >> Influenza PCR 3/15  ENDOCRINE A:   Hx of DM, hypothyroidism. P:   Continue synthroid SSI CBG's q4hr Hold outpatient Lantus, Janumet  NEUROLOGIC A:   Anxiety, insomnia, chronic pain. P:   PRN tylenol Hold outpatient amitriptyline, norco.  Summary: Respiratory improved since last night.  Will try to transition off BiPAP as tolerated.  Doesn't look like she needs intubation at this time.  CC time 35 minutes.  Coralyn HellingVineet Avilyn Virtue, MD [facility] Pulmonary/Critical Care 05/21/2014, 8:07 AM Pager:  [phone] After 3pm call: [phone]

## 2014-05-22 ENCOUNTER — Inpatient Hospital Stay (HOSPITAL_COMMUNITY): Payer: Commercial Managed Care - HMO

## 2014-05-22 LAB — BLOOD GAS, ARTERIAL
ACID-BASE DEFICIT: 3.8 mmol/L — AB (ref 0.0–2.0)
Acid-base deficit: 7 mmol/L — ABNORMAL HIGH (ref 0.0–2.0)
BICARBONATE: 20.9 meq/L (ref 20.0–24.0)
Bicarbonate: 19.1 mEq/L — ABNORMAL LOW (ref 20.0–24.0)
DRAWN BY: 308601
DRAWN BY: 257701
FIO2: 0.5 %
FIO2: 0.7 %
LHR: 35 {breaths}/min
O2 Saturation: 94.3 %
O2 Saturation: 90.4 %
PATIENT TEMPERATURE: 37
PCO2 ART: 43 mmHg (ref 35.0–45.0)
PEEP: 8 cmH2O
PEEP: 10 cmH2O
Patient temperature: 38.6
RATE: 30 resp/min
TCO2: 19.8 mmol/L (ref 0–100)
TCO2: 17.8 mmol/L (ref 0–100)
VT: 380 mL
VT: 450 mL
pCO2 arterial: 42.6 mmHg (ref 35.0–45.0)
pH, Arterial: 7.322 — ABNORMAL LOW (ref 7.350–7.450)
pH, Arterial: 7.27 — ABNORMAL LOW (ref 7.350–7.450)
pO2, Arterial: 82 mmHg (ref 80.0–100.0)
pO2, Arterial: 66.7 mmHg — ABNORMAL LOW (ref 80.0–100.0)

## 2014-05-22 LAB — GLUCOSE, CAPILLARY
Glucose-Capillary: 216 mg/dL — ABNORMAL HIGH (ref 70–99)
Glucose-Capillary: 246 mg/dL — ABNORMAL HIGH (ref 70–99)
Glucose-Capillary: 255 mg/dL — ABNORMAL HIGH (ref 70–99)
Glucose-Capillary: 264 mg/dL — ABNORMAL HIGH (ref 70–99)
Glucose-Capillary: 316 mg/dL — ABNORMAL HIGH (ref 70–99)
Glucose-Capillary: 329 mg/dL — ABNORMAL HIGH (ref 70–99)

## 2014-05-22 LAB — CBC
HCT: 30 % — ABNORMAL LOW (ref 36.0–46.0)
Hemoglobin: 9.2 g/dL — ABNORMAL LOW (ref 12.0–15.0)
MCH: 26 pg (ref 26.0–34.0)
MCHC: 30.7 g/dL (ref 30.0–36.0)
MCV: 84.7 fL (ref 78.0–100.0)
PLATELETS: 204 10*3/uL (ref 150–400)
RBC: 3.54 MIL/uL — ABNORMAL LOW (ref 3.87–5.11)
RDW: 13.9 % (ref 11.5–15.5)
WBC: 13.8 10*3/uL — AB (ref 4.0–10.5)

## 2014-05-22 LAB — BASIC METABOLIC PANEL
ANION GAP: 7 (ref 5–15)
BUN: 11 mg/dL (ref 6–23)
CO2: 21 mmol/L (ref 19–32)
Calcium: 7.2 mg/dL — ABNORMAL LOW (ref 8.4–10.5)
Chloride: 108 mmol/L (ref 96–112)
Creatinine, Ser: 0.87 mg/dL (ref 0.50–1.10)
GFR calc Af Amer: 79 mL/min — ABNORMAL LOW (ref >90)
GFR, EST NON AFRICAN AMERICAN: 68 mL/min — AB (ref >90)
Glucose, Bld: 261 mg/dL — ABNORMAL HIGH (ref 70–99)
Potassium: 3.6 mmol/L (ref 3.5–5.1)
SODIUM: 136 mmol/L (ref 135–145)

## 2014-05-22 LAB — PROCALCITONIN: Procalcitonin: 9.26 ng/mL

## 2014-05-22 MED ORDER — FENTANYL CITRATE 0.05 MG/ML IJ SOLN: 100.0000 ug | Freq: Once | INTRAMUSCULAR | Status: AC | PRN

## 2014-05-22 MED ORDER — FENTANYL CITRATE 0.05 MG/ML IJ SOLN
25.0000 ug/h | INTRAMUSCULAR | Status: DC
  Administered 2014-05-22: 200 ug/h via INTRAVENOUS
  Administered 2014-05-24: 300 ug/h via INTRAVENOUS
  Administered 2014-05-24: 250 ug/h via INTRAVENOUS
  Administered 2014-05-24: 200 ug/h via INTRAVENOUS
  Administered 2014-05-24: 300 ug/h via INTRAVENOUS
  Administered 2014-05-25: 175 ug/h via INTRAVENOUS
  Administered 2014-05-26 (×2): 250 ug/h via INTRAVENOUS
  Administered 2014-05-26 – 2014-05-27 (×2): 300 ug/h via INTRAVENOUS
  Administered 2014-05-28: 200 ug/h via INTRAVENOUS
  Administered 2014-05-28 – 2014-05-29 (×3): 300 ug/h via INTRAVENOUS
  Administered 2014-05-29 – 2014-05-30 (×2): 200 ug/h via INTRAVENOUS
  Filled 2014-05-22 (×18): qty 50

## 2014-05-22 MED ORDER — CISATRACURIUM BOLUS VIA INFUSION
0.0500 mg/kg | Freq: Once | INTRAVENOUS | Status: AC
  Administered 2014-05-22: 3.9 mg via INTRAVENOUS
  Filled 2014-05-22: qty 4

## 2014-05-22 MED ORDER — SODIUM CHLORIDE 0.9 % IV SOLN
1.0000 mg/h | INTRAVENOUS | Status: DC
  Administered 2014-05-22: 2 mg/h via INTRAVENOUS
  Filled 2014-05-22 (×2): qty 10

## 2014-05-22 MED ORDER — MIDAZOLAM HCL 5 MG/ML IJ SOLN
2.0000 mg | Freq: Once | INTRAMUSCULAR | Status: AC
  Administered 2014-05-22: 2 mg via INTRAVENOUS

## 2014-05-22 MED ORDER — SODIUM CHLORIDE 0.9 % IV SOLN
3.0000 ug/kg/min | INTRAVENOUS | Status: DC
  Administered 2014-05-22 – 2014-05-25 (×5): 3 ug/kg/min via INTRAVENOUS
  Filled 2014-05-22 (×6): qty 20

## 2014-05-22 MED ORDER — FENTANYL BOLUS VIA INFUSION
50.0000 ug | INTRAVENOUS | Status: DC | PRN
  Administered 2014-05-25 – 2014-05-29 (×9): 50 ug via INTRAVENOUS
  Filled 2014-05-22: qty 50

## 2014-05-22 MED ORDER — SODIUM CHLORIDE 0.9 % IV BOLUS (SEPSIS)
500.0000 mL | Freq: Once | INTRAVENOUS | Status: AC
  Administered 2014-05-22: 500 mL via INTRAVENOUS

## 2014-05-22 MED ORDER — MIDAZOLAM BOLUS VIA INFUSION
2.0000 mg | INTRAVENOUS | Status: DC | PRN
  Filled 2014-05-22: qty 2

## 2014-05-22 MED ORDER — MIDAZOLAM HCL 5 MG/ML IJ SOLN: 2.0000 mg | Freq: Once | INTRAMUSCULAR | Status: AC | PRN

## 2014-05-22 MED ORDER — ARTIFICIAL TEARS OP OINT
1.0000 "application " | TOPICAL_OINTMENT | Freq: Three times a day (TID) | OPHTHALMIC | Status: DC
  Administered 2014-05-22 – 2014-05-25 (×9): 1 via OPHTHALMIC
  Filled 2014-05-22: qty 3.5

## 2014-05-22 MED ORDER — INSULIN GLARGINE 100 UNIT/ML ~~LOC~~ SOLN
10.0000 [IU] | Freq: Every day | SUBCUTANEOUS | Status: DC
  Administered 2014-05-22: 10 [IU] via SUBCUTANEOUS
  Filled 2014-05-22 (×3): qty 0.1

## 2014-05-22 MED ORDER — FENTANYL CITRATE 0.05 MG/ML IJ SOLN
100.0000 ug | Freq: Once | INTRAMUSCULAR | Status: AC
  Administered 2014-05-22: 100 ug via INTRAVENOUS

## 2014-05-22 NOTE — Progress Notes (Signed)
NUTRITION FOLLOW-UP  DOCUMENTATION CODES Per approved criteria  -Not Applicable   INTERVENTION: - Continue Vital AF 1.2 @ 50 ml/hr via OGT and increase by 10 ml every 4 hours to goal rate of 65 ml/hr.   Tube feeding regimen with calories from Propofol provides 2115 kcal (103% of needs), 117 grams of protein, and 1264 ml of H2O.   NUTRITION DIAGNOSIS: Inadequate oral intake related to inability to eat as evidenced by NPO; ongoing  Goal: Pt to meet >/= 90% of their estimated nutrition needs; improving with TF   Monitor:  Weight trend, initiation and toleration of TF, labs, vent status/settings  69 y.o. female  Admitting Dx: <principal problem not specified>  ASSESSMENT: 69 y.o. F brought to [facility] ED 3/15 with 4 day hx of generalized body aches, SOB, subjective fevers, chills. In ED, had fever to 103, was placed on NRB for hypoxia and had RR in high 30's. PCCM called for admission due to concern for potential of deterioration.   3/16: - Pt intubated 3/16 - Spoke with husband who reports that pt as eating well until she started to get sick about 2 weeks ago. No significant weight loss. Pt's usual body weight is 165 lbs.  - No signs of fat or muscle wasting.   3/17: - Pt with H1N1 and Pneumonia - Per RN, pt tolerating TF.  - TF running at 50 mL/hr and being advanced on schedule.   Labs: CBGs 176-264  Patient is currently intubated on ventilator support MV: 13.4 L/min Temp (24hrs), Avg:101.9 F (38.8 C), Min:100.6 F (38.1 C), Max:103.3 F (39.6 C)  Propofol: 9.2 ml/hr to provide additional 243 kcal  Labs reviewed  Height: Ht Readings from Last 1 Encounters:  05/20/14  (1.626 m)   Weight: Wt Readings from Last 1 Encounters:  05/22/14 169 lb 12.1 oz (77 kg)   BMI:  Body mass index is 29.12 kg/(m^2).  Estimated Nutritional Needs: Kcal: 2058 Protein: 100-115 g Fluid: 2.0 L/day  Skin: intact  Diet Order: Diet NPO time specified  EDUCATION  NEEDS: -Education not appropriate at this time   Intake/Output Summary (Last 24 hours) at 05/22/14 1126 Last data filed at 05/22/14 1100  Gross per 24 hour  Intake 3070.74 ml  Output   1515 ml  Net 1555.74 ml    Last BM: prior to admission   Labs:   Recent Labs Lab 05/20/14 1627 05/21/14 0345 05/22/14 0430  NA 131* 136 136  K 4.2 4.1 3.6  CL 95* 104 108  CO2 BUN CREATININE 1.24* 0.74 0.87  CALCIUM 8.5 7.5* 7.2*  MG  --  1.3*  --   PHOS  --  2.1*  --   GLUCOSE 227* 194* 261*    CBG (last 3)   Recent Labs  05/21/14 2359 05/22/14 0429 05/22/14 0745  GLUCAP 246* 264* 216*    Scheduled Meds: . antiseptic oral rinse  7 mL Mouth Rinse QID  . aspirin EC  81 mg Oral Daily  . azithromycin  500 mg Intravenous Q24H  . cefTRIAXone (ROCEPHIN)  IV  1 g Intravenous Q24H  . chlorhexidine  15 mL Mouth Rinse BID  . enoxaparin (LOVENOX) injection  40 mg Subcutaneous Q24H  . insulin aspart  0-20 Units Subcutaneous 6 times per day  . insulin glargine  10 Units Subcutaneous Daily  . levothyroxine  150 mcg Per Tube QAC breakfast  . oseltamivir  75 mg Per Tube Q12H  .  pantoprazole sodium  40 mg Per Tube Q24H    Continuous Infusions: . sodium chloride 75 mL/hr (05/22/14 0108)  . feeding supplement (VITAL AF 1.2 CAL) 1,000 mL (05/22/14 1000)  . fentaNYL infusion INTRAVENOUS 175 mcg/hr (05/22/14 1000)  . norepinephrine (LEVOPHED) Adult infusion Stopped (05/22/14 0835)  . propofol 20 mcg/kg/min (05/22/14 1000)    Past Medical History  Diagnosis Date  . Diabetes mellitus   . Hypothyroidism   . Hypertension   . Hyperlipidemia     Past Surgical History  Procedure Laterality Date  . Back surgery      Emmaline KluverHaley Chastin Garlitz MS, RD, LDN [phone]

## 2014-05-22 NOTE — Progress Notes (Addendum)
Inpatient Diabetes Program Recommendations  AACE/ADA: New Consensus Statement on Inpatient Glycemic Control (2013)  Target Ranges:  Prepandial:   less than 140 mg/dL      Peak postprandial:   less than 180 mg/dL (1-2 hours)      Critically ill patients:  140 - 180 mg/dL   Reason for Visit: Hyperglycemia  Diabetes history: DM2 Outpatient Diabetes medications: Lantus 40 in am and 60 units QHS, Janumet 50/500 QD,  Current orders for Inpatient glycemic control: Lantus 10 QD and Novolog resistant Q4H  TF started. Will need TF coverage.  Recommendations:  Begin ICU Hyperglycemia Protocol. Will need Novolog 4 units Q4H for TF coverage.   Will follow. Thank you. Ailene Ardshonda Esten Dollar, RD, LDN, CDE Inpatient Diabetes Coordinator [phone]

## 2014-05-22 NOTE — Progress Notes (Signed)
eLink Physician-Brief Progress Note Patient Name: Grace BoschLinda K Schendel DOB: 26-Mar-1945 MRN: [id number]   Date of Service  05/22/2014  HPI/Events of Note  Severe ARDS, P:F ratio on PEEP 10 is 94 (worsening from earlier draw)  eICU Interventions  Nimbex Consider prone 3/18 AM if no improvement in P:F     Intervention Category Major Interventions: Respiratory failure - evaluation and management  Xzayvier Fagin 05/22/2014, 4:52 PM

## 2014-05-22 NOTE — Progress Notes (Signed)
PULMONARY / CRITICAL CARE MEDICINE   Name: Grace Mitchell MRN: [id number] DOB: Jan 27, 1946    ADMISSION DATE:  05/20/2014 CONSULTATION DATE:  05/22/2014  REFERRING MD :  EDP  CHIEF COMPLAINT:  Body aches, SOB, chills, fever  INITIAL PRESENTATION:  69 yo female smoker with dyspnea, fever (Tm103F), chills, malaise from H1N1 influenza & pneumococcal PNA and sepsis.  STUDIES:   SIGNIFICANT EVENTS: 3/15 admit  SUBJECTIVE:   Febrile Good UO, lower levo gtt desatn requiring recruitment this am   VITAL SIGNS: Temp:  [100.6 F (38.1 C)-103.3 F (39.6 C)] 100.9 F (38.3 C) (03/17 0800) Pulse Rate:  [118-138] 131 (03/17 0401) Resp:  [0-40] 35 (03/17 0401) BP: (62-160)/(32-70) 126/49 mmHg (03/17 0401) SpO2:  [86 %-100 %] 100 % (03/17 0800) FiO2 (%):  [40 %-100 %] 80 % (03/17 0835) Weight:  [77 kg (169 lb 12.1 oz)] 77 kg (169 lb 12.1 oz) (03/17 0508) INTAKE / OUTPUT: Intake/Output      03/16 0701 - 03/17 0700 03/17 0701 - 03/18 0700   P.O. 20    I.V. (mL/kg) 2041.9 (26.5) 290.9 (3.8)   NG/GT 393 120   IV Piggyback 300    Total Intake(mL/kg) 2754.9 (35.8) 410.9 (5.3)   Urine (mL/kg/hr) 1450 (0.8) 125 (0.7)   Total Output 1450 125   Net +1304.9 +285.9        Urine Occurrence 2 x      PHYSICAL EXAMINATION: General: sedated, intubated Neuro: normal strength prior to intubattion HEENT: oral ETT Cardiovascular: regular, tachycardic Lungs: b/l scattered rhonchi Abdomen: soft, non tender Musculoskeletal: no edema Skin: no rashes  LABS:  CBC  Recent Labs Lab 05/20/14 1627 05/21/14 0345 05/22/14 0430  WBC 16.0* 10.9* 13.8*  HGB 11.7* 11.3* 9.2*  HCT 37.3 36.9 30.0*  PLT 194 186 204   Coag's  Recent Labs Lab 05/20/14 1627  INR 1.15   BMET  Recent Labs Lab 05/20/14 1627 05/21/14 0345 05/22/14 0430  NA 131* 136 136  K 4.2 4.1 3.6  CL 95* 104 108  CO2 BUN CREATININE 1.24* 0.74 0.87  GLUCOSE 227* 194* 261*   Electrolytes  Recent  Labs Lab 05/20/14 1627 05/21/14 0345 05/22/14 0430  CALCIUM 8.5 7.5* 7.2*  MG  --  1.3*  --   PHOS  --  2.1*  --    Sepsis Markers  Recent Labs Lab 05/20/14 1629 05/20/14 2140 05/22/14 0430  LATICACIDVEN 3.31* 2.5*  --   PROCALCITON  --  6.94 9.26   ABG  Recent Labs Lab 05/21/14 1355 05/21/14 1455 05/22/14 0410  PHART 7.400 7.276* 7.322*  PCO2ART 31.6* 45.5* 42.6  PO2ART 344.0* 129.0* 82.0   Liver Enzymes  Recent Labs Lab 05/20/14 1627  AST 29  ALT 11  ALKPHOS 79  BILITOT 0.9  ALBUMIN 3.6   Cardiac Enzymes  Recent Labs Lab 05/20/14 1627 05/20/14 2140  TROPONINI <0.03 0.03   Glucose  Recent Labs Lab 05/21/14 1329 05/21/14 1639 05/21/14 2029 05/21/14 2359 05/22/14 0429 05/22/14 0745  GLUCAP 113* 140* 176* 246* 264* 216*    Imaging Dg Chest Port 1 View  05/22/2014   CLINICAL DATA:  Pneumonia, intubated.  EXAM: PORTABLE CHEST - 1 VIEW  COMPARISON:  05/21/2014  FINDINGS: Endotracheal tube is 3.5 cm above the carina. Nasogastric tube extends into the stomach. Left jugular central line extends into the SVC. Diffuse airspace opacities persist bilaterally without significant interval change.  IMPRESSION: Support equipment appears satisfactorily positioned.  Diffusely distributed airspace opacities without significant interval change.   Electronically Signed   By: Ellery Plunkaniel R Mitchell M.D.   On: 05/22/2014 06:21   Dg Chest Port 1 View  05/21/2014   CLINICAL DATA:  Evaluate tubes and lines.  Respiratory distress.  EXAM: PORTABLE CHEST - 1 VIEW  COMPARISON:  Portable film earlier in the day.  FINDINGS: Endotracheal tube has been placed, with its tip 4.3 cm above carina. Orogastric tube in the stomach. Central venous line from LEFT IJ approach lies with its tip at the cavoatrial junction. Normal cardiac size. Diffuse airspace disease appears worse. BILATERAL pulmonary opacities with near complete white out. No visible pneumothorax.  IMPRESSION: Satisfactory tube and  line placement.  Worsening ARDS pattern.   Electronically Signed   By: Davonna BellingJohn  Curnes M.D.   On: 05/21/2014 14:40   Dg Chest Port 1 View  05/21/2014   CLINICAL DATA:  Respiratory failure  EXAM: PORTABLE CHEST - 1 VIEW  COMPARISON:  05/20/2014  FINDINGS: Dense consolidation persists bilaterally without significant interval change. No large effusion is evident.  IMPRESSION: Persistent dense consolidation bilaterally.   Electronically Signed   By: Ellery Plunkaniel R Mitchell M.D.   On: 05/21/2014 07:03   Dg Chest Port 1 View  05/21/2014   CLINICAL DATA:  Worsening shortness of breath and decreased O2 saturation. Initial encounter.  EXAM: PORTABLE CHEST - 1 VIEW  COMPARISON:  Chest radiograph performed earlier today at 4:36 p.m.  FINDINGS: Significantly worsened diffuse bilateral airspace opacification may reflect diffuse pneumonia, pulmonary edema or ARDS. No pleural effusion or pneumothorax is seen.  The cardiomediastinal silhouette is borderline normal in size. No acute osseous abnormalities are identified.  IMPRESSION: Significantly worsened diffuse bilateral airspace opacification may reflect diffuse pneumonia, pulmonary edema or ARDS.   Electronically Signed   By: Roanna RaiderJeffery  Chang M.D.   On: 05/21/2014 01:18   Dg Chest Port 1 View  05/20/2014   CLINICAL DATA:  Fever for 4 days.  EXAM: PORTABLE CHEST - 1 VIEW  COMPARISON:  04/23/2005  FINDINGS: Patchy bilateral mid and lower lung zone airspace opacities have developed. Normal heart size. No pneumothorax.  IMPRESSION: Patchy bilateral airspace disease worrisome for pneumonia, ARDS, or pulmonary edema.   Electronically Signed   By: Jolaine ClickArthur  Hoss M.D.   On: 05/20/2014 16:43     ASSESSMENT / PLAN:  PULMONARY A: ARDS 2nd to H1N1 PNA. Tobacco abuse P:   ARDS protocol Lower RR to 30 Rpt ABG PRN BD's  CARDIOVASCULAR A:  Sepsis 2nd to PNA. Hx HTN, HLD. P:  Minimise IV fluids once off pressors Continue outpatient Simvastatin Hold outpatient  Ramipril  RENAL A:   AKI from sepsis >> improved. P:   Monitor renal fx  GASTROINTESTINAL A:   Nutrition. P:   TFs to goal  HEMATOLOGIC A:   Leukocytosis. P:  F/u CBC Lovenox for DVT prevention  INFECTIOUS A:   Pneumonia with pneumococcal Ag positive. P:   Ct  rocephin, zithromax, tamiflu 3/15 >>  Pneumococcal Ag 3/15 >> positive Legionella Ag 3/15 >>neg Influenza PCR 3/15>> H1N1 pos  ENDOCRINE A:   Hx of DM, hypothyroidism. P:   Continue synthroid SSI CBG's q4hr Add lantus Hold outpatientJanumet  NEUROLOGIC A:   Anxiety, insomnia, chronic pain. P:   PRN tylenol Hold outpatient amitriptyline, norco.  Summary: ARDS protocol , titrate PEEP/FIO2, ventilation acceptable on 6 cc /kg May need paralytic if pO2/FIO2 worsens  The patient is critically ill with multiple organ systems failure and requires high complexity  decision making for assessment and support, frequent evaluation and titration of therapies, application of advanced monitoring technologies and extensive interpretation of multiple databases. Critical Care Time devoted to patient care services described in this note independent of APP time is 40 minutes.    Cyril Mourning MD. Tonny Bollman. [facility] Pager [phone] If no response call [phone]    05/22/2014, 9:11 AM

## 2014-05-23 ENCOUNTER — Inpatient Hospital Stay (HOSPITAL_COMMUNITY): Payer: Commercial Managed Care - HMO

## 2014-05-23 LAB — BLOOD GAS, ARTERIAL
Acid-base deficit: 3.6 mmol/L — ABNORMAL HIGH (ref 0.0–2.0)
Bicarbonate: 21.3 mEq/L (ref 20.0–24.0)
Drawn by: 308601
FIO2: 0.4 %
LHR: 30 {breaths}/min
O2 SAT: 90.5 %
PEEP: 8 cmH2O
PO2 ART: 66.3 mmHg — AB (ref 80.0–100.0)
Patient temperature: 38.3
TCO2: 20.3 mmol/L (ref 0–100)
VT: 450 mL
pCO2 arterial: 43 mmHg (ref 35.0–45.0)
pH, Arterial: 7.323 — ABNORMAL LOW (ref 7.350–7.450)

## 2014-05-23 LAB — BASIC METABOLIC PANEL
Anion gap: 8 (ref 5–15)
BUN: 13 mg/dL (ref 6–23)
CHLORIDE: 110 mmol/L (ref 96–112)
CO2: 22 mmol/L (ref 19–32)
CREATININE: 0.87 mg/dL (ref 0.50–1.10)
Calcium: 7.3 mg/dL — ABNORMAL LOW (ref 8.4–10.5)
GFR calc Af Amer: 79 mL/min — ABNORMAL LOW (ref >90)
GFR calc non Af Amer: 68 mL/min — ABNORMAL LOW (ref >90)
Glucose, Bld: 247 mg/dL — ABNORMAL HIGH (ref 70–99)
Potassium: 3.5 mmol/L (ref 3.5–5.1)
Sodium: 140 mmol/L (ref 135–145)

## 2014-05-23 LAB — CBC
HEMATOCRIT: 27.6 % — AB (ref 36.0–46.0)
Hemoglobin: 8.6 g/dL — ABNORMAL LOW (ref 12.0–15.0)
MCH: 26.1 pg (ref 26.0–34.0)
MCHC: 31.2 g/dL (ref 30.0–36.0)
MCV: 83.9 fL (ref 78.0–100.0)
Platelets: 231 10*3/uL (ref 150–400)
RBC: 3.29 MIL/uL — AB (ref 3.87–5.11)
RDW: 14 % (ref 11.5–15.5)
WBC: 12.5 10*3/uL — AB (ref 4.0–10.5)

## 2014-05-23 LAB — CULTURE, RESPIRATORY W GRAM STAIN
Culture: NO GROWTH
Special Requests: NORMAL

## 2014-05-23 LAB — GLUCOSE, CAPILLARY
Glucose-Capillary: 218 mg/dL — ABNORMAL HIGH (ref 70–99)
Glucose-Capillary: 271 mg/dL — ABNORMAL HIGH (ref 70–99)
Glucose-Capillary: 362 mg/dL — ABNORMAL HIGH (ref 70–99)

## 2014-05-23 LAB — CLOSTRIDIUM DIFFICILE BY PCR: Toxigenic C. Difficile by PCR: NEGATIVE

## 2014-05-23 LAB — CULTURE, RESPIRATORY

## 2014-05-23 MED ORDER — INSULIN GLARGINE 100 UNIT/ML ~~LOC~~ SOLN
15.0000 [IU] | Freq: Every day | SUBCUTANEOUS | Status: DC
  Administered 2014-05-23: 15 [IU] via SUBCUTANEOUS
  Filled 2014-05-23 (×2): qty 0.15

## 2014-05-23 MED ORDER — VITAMINS A & D EX OINT
TOPICAL_OINTMENT | CUTANEOUS | Status: AC
  Administered 2014-05-23: 5
  Filled 2014-05-23: qty 5

## 2014-05-23 MED ORDER — FUROSEMIDE 10 MG/ML IJ SOLN
40.0000 mg | Freq: Two times a day (BID) | INTRAMUSCULAR | Status: DC
  Administered 2014-05-23 – 2014-05-24 (×4): 40 mg via INTRAVENOUS
  Filled 2014-05-23 (×4): qty 4

## 2014-05-23 MED ORDER — POTASSIUM CHLORIDE 20 MEQ/15ML (10%) PO SOLN
20.0000 meq | ORAL | Status: AC
  Administered 2014-05-23 (×2): 20 meq
  Filled 2014-05-23 (×2): qty 15

## 2014-05-23 NOTE — Progress Notes (Signed)
50 ml versed wasted.  Witness Orlie DakinMike Moore, RN.

## 2014-05-23 NOTE — Progress Notes (Signed)
RT completed Recruitment Maneuver per ARDS Protocol- tolerated well- Sp02 97%.

## 2014-05-23 NOTE — Progress Notes (Signed)
PULMONARY / CRITICAL CARE MEDICINE   Name: Grace Mitchell MRN: [id number] DOB: Nov 16, 1945    ADMISSION DATE:  05/20/2014 CONSULTATION DATE:  05/23/2014  REFERRING MD :  EDP  CHIEF COMPLAINT:  Body aches, SOB, chills, fever  INITIAL PRESENTATION:  69 yo female smoker with dyspnea, fever (Tm103F), chills, malaise from H1N1 influenza & pneumococcal PNA and sepsis.  STUDIES:   SIGNIFICANT EVENTS: 3/15 admit 3/17 paralytic for worsening pO2/FIO2  SUBJECTIVE:   Remains Febrile Nimbex added Good UO, Off levo gtt desatn requiring recruitment this am   VITAL SIGNS: Temp:  [100.6 F (38.1 C)-102.9 F (39.4 C)] 101.7 F (38.7 C) (03/18 0800) Pulse Rate:  [119-137] 122 (03/18 0500) Resp:  [30] 30 (03/18 0300) BP: (91-152)/(45-82) 125/54 mmHg (03/18 0800) SpO2:  [88 %-100 %] 91 % (03/18 0500) FiO2 (%):  [50 %-70 %] 50 % (03/18 0838) Weight:  [82.3 kg (181 lb 7 oz)] 82.3 kg (181 lb 7 oz) (03/18 0500) INTAKE / OUTPUT: Intake/Output      03/17 0701 - 03/18 0700 03/18 0701 - 03/19 0700   P.O.     I.V. (mL/kg) 2588.5 (31.5)    NG/GT 1410    IV Piggyback 800    Total Intake(mL/kg) 4798.5 (58.3)    Urine (mL/kg/hr) 1035 (0.5)    Stool 2 (0)    Total Output 1037     Net +3761.5            PHYSICAL EXAMINATION: General: sedated, intubated, paralysed ,  Neuro: normal strength prior to intubattion,BIS 30 HEENT: oral ETT Cardiovascular: regular, tachycardic Lungs: b/l scattered rhonchi Abdomen: soft, non tender Musculoskeletal: no edema Skin: no rashes  LABS:  CBC  Recent Labs Lab 05/21/14 0345 05/22/14 0430 05/23/14 0500  WBC 10.9* 13.8* 12.5*  HGB 11.3* 9.2* 8.6*  HCT 36.9 30.0* 27.6*  PLT 186 204 231   Coag's  Recent Labs Lab 05/20/14 1627  INR 1.15   BMET  Recent Labs Lab 05/21/14 0345 05/22/14 0430 05/23/14 0500  NA 136 136 140  K 4.1 3.6 3.5  CL 104 108 110  CO2 BUN CREATININE 0.74 0.87 0.87  GLUCOSE 194* 261* 247*    Electrolytes  Recent Labs Lab 05/21/14 0345 05/22/14 0430 05/23/14 0500  CALCIUM 7.5* 7.2* 7.3*  MG 1.3*  --   --   PHOS 2.1*  --   --    Sepsis Markers  Recent Labs Lab 05/20/14 1629 05/20/14 2140 05/22/14 0430  LATICACIDVEN 3.31* 2.5*  --   PROCALCITON  --  6.94 9.26   ABG  Recent Labs Lab 05/22/14 0410 05/22/14 1051 05/23/14 0345  PHART 7.322* 7.270* 7.323*  PCO2ART 42.6 43.0 43.0  PO2ART 82.0 66.7* 66.3*   Liver Enzymes  Recent Labs Lab 05/20/14 1627  AST 29  ALT 11  ALKPHOS 79  BILITOT 0.9  ALBUMIN 3.6   Cardiac Enzymes  Recent Labs Lab 05/20/14 1627 05/20/14 2140  TROPONINI <0.03 0.03   Glucose  Recent Labs Lab 05/22/14 1221 05/22/14 1615 05/22/14 2000 05/23/14 0022 05/23/14 0449 05/23/14 0750  GLUCAP 316* 329* 255* 362* 271* 218*    Imaging Dg Chest Port 1 View  05/23/2014   CLINICAL DATA:  ARDS  EXAM: PORTABLE CHEST - 1 VIEW  COMPARISON:  05/22/2014  FINDINGS: Endotracheal tube tip is 3 cm above the carina. Left jugular central line extends into the SVC. Nasogastric tube extends into the stomach. There is no interval change in  the diffuse airspace opacities.  IMPRESSION: Support equipment appears satisfactorily positioned. No significant interval change in the diffuse airspace opacities.   Electronically Signed   By: Ellery Plunkaniel R Mitchell M.D.   On: 05/23/2014 05:31   Dg Chest Port 1 View  05/22/2014   CLINICAL DATA:  Pneumonia, intubated.  EXAM: PORTABLE CHEST - 1 VIEW  COMPARISON:  05/21/2014  FINDINGS: Endotracheal tube is 3.5 cm above the carina. Nasogastric tube extends into the stomach. Left jugular central line extends into the SVC. Diffuse airspace opacities persist bilaterally without significant interval change.  IMPRESSION: Support equipment appears satisfactorily positioned. Diffusely distributed airspace opacities without significant interval change.   Electronically Signed   By: Ellery Plunkaniel R Mitchell M.D.   On: 05/22/2014 06:21    Dg Chest Port 1 View  05/21/2014   CLINICAL DATA:  Evaluate tubes and lines.  Respiratory distress.  EXAM: PORTABLE CHEST - 1 VIEW  COMPARISON:  Portable film earlier in the day.  FINDINGS: Endotracheal tube has been placed, with its tip 4.3 cm above carina. Orogastric tube in the stomach. Central venous line from LEFT IJ approach lies with its tip at the cavoatrial junction. Normal cardiac size. Diffuse airspace disease appears worse. BILATERAL pulmonary opacities with near complete white out. No visible pneumothorax.  IMPRESSION: Satisfactory tube and line placement.  Worsening ARDS pattern.   Electronically Signed   By: Davonna BellingJohn  Curnes M.D.   On: 05/21/2014 14:40     ASSESSMENT / PLAN:  PULMONARY A: ARDS 2nd to H1N1 PNA. Tobacco abuse P:   ARDS protocol Ct nimbex until 3/19 RR to 30 PRN BD's Diurese to CVP 4 range now that off pressors  CARDIOVASCULAR A:  Sepsis 2nd to PNA. Hx HTN, HLD. P:  dc IV fluids  Continue outpatient Simvastatin Hold outpatient Ramipril  RENAL A:   AKI from sepsis >> improved. P:   Monitor renal fx Lasix 40 q 12h  GASTROINTESTINAL A:   Nutrition. P:   TFs to goal  HEMATOLOGIC A:   Leukocytosis. P:  F/u CBC Lovenox for DVT prevention  INFECTIOUS A:   Pneumonia with pneumococcal Ag positive. P:   Ct  rocephin, zithromax, tamiflu 3/15 >>  Pneumococcal Ag 3/15 >> positive Legionella Ag 3/15 >>neg Influenza PCR 3/15>> H1N1 pos c diff 3/18 >>  ENDOCRINE A:   Hx of DM, hypothyroidism. P:   Continue synthroid SSI CBG's q4hr Increase lantus to 15 Hold outpatient Janumet  NEUROLOGIC A:   Anxiety, insomnia, chronic pain. P:   PRN tylenol Hold outpatient amitriptyline, norco. Propofol Armida Sans/fent gtt Dc versed  Family - updated husband  Summary: ARDS protocol , titrate PEEP/FIO2, ventilation acceptable on 6 cc /kg Nimbex x 48 h until 3/19  The patient is critically ill with multiple organ systems failure and requires high  complexity decision making for assessment and support, frequent evaluation and titration of therapies, application of advanced monitoring technologies and extensive interpretation of multiple databases. Critical Care Time devoted to patient care services described in this note independent of APP time is 40 minutes.    Cyril Mourningakesh Giulio Bertino MD. Tonny BollmanFCCP. [facility] Pager [phone] If no response call [phone]    05/23/2014, 9:00 AM

## 2014-05-23 NOTE — Progress Notes (Addendum)
NUTRITION FOLLOW-UP  DOCUMENTATION CODES Per approved criteria  -Not Applicable   INTERVENTION: - Continue Vital AF 1.2 @ 67 ml/hr via OGT.  Tube feeding regimen with calories from Propofol provides 2115 kcal (105% of needs), 117 grams of protein, and 1264 ml of H2O.   NUTRITION DIAGNOSIS: Inadequate oral intake related to inability to eat as evidenced by NPO; ongoing  Goal: Pt to meet >/= 90% of their estimated nutrition needs; met with TF   Monitor:  Weight trend, initiation and toleration of TF, labs/CBGs, vent status/settings  69 y.o. female  Admitting Dx: <principal problem not specified>  ASSESSMENT: 69 y.o. F brought to [facility] ED 3/15 with 4 day hx of generalized body aches, SOB, subjective fevers, chills. In ED, had fever to 103, was placed on NRB for hypoxia and had RR in high 30's. PCCM called for admission due to concern for potential of deterioration.   3/16: - Pt intubated 3/16 - Spoke with husband who reports that pt as eating well until she started to get sick about 2 weeks ago. No significant weight loss. Pt's usual body weight is 165 lbs.  - No signs of fat or muscle wasting.   3/18: - Pt with H1N1 and Pneumonia. Running low grade fever.  - Per RN, pt with 30 mL residuals. Having diarrhea. C Diff pending.  - TF running at goal rate of 65 mL/hr.  - CBGs elevated- Lantus increased per MD note. Followed by Diabetes Coordinator.  - Pt fluid overloaded (+8.7 L since admission.) Wt increased 12 lb from 3/17 to 3/18. Re-weighed during RD visit to confirm. On Lasix. Per RN, urine output increasing.   Labs: CBGs elevated 218-362  Patient is currently intubated on ventilator support MV: 13.5 L/min Temp (24hrs), Avg:101.7 F (38.7 C), Min:100.6 F (38.1 C), Max:102.9 F (39.4 C)  Propofol: 9.2 ml/hr to provide additional 243 kcal  Labs reviewed  Height: Ht Readings from Last 1 Encounters:  05/20/14 '5\' 4"'  (1.626 m)   Weight: Wt Readings from Last 1  Encounters:  05/23/14 181 lb 7 oz (82.3 kg)   BMI:  Body mass index is 31.13 kg/(m^2).  Estimated Nutritional Needs: Kcal: 1993 Protein: 100-115 g Fluid: 2.0 L/day  Skin: intact  Diet Order: Diet NPO time specified  EDUCATION NEEDS: -Education not appropriate at this time   Intake/Output Summary (Last 24 hours) at 05/23/14 1211 Last data filed at 05/23/14 1200  Gross per 24 hour  Intake 4869.81 ml  Output   1347 ml  Net 3522.81 ml    Last BM: prior to admission   Labs:   Recent Labs Lab 05/21/14 0345 05/22/14 0430 05/23/14 0500  NA 136 136 140  K 4.1 3.6 3.5  CL 104 108 110  CO2 '22 21 22  ' BUN '12 11 13  ' CREATININE 0.74 0.87 0.87  CALCIUM 7.5* 7.2* 7.3*  MG 1.3*  --   --   PHOS 2.1*  --   --   GLUCOSE 194* 261* 247*    CBG (last 3)   Recent Labs  05/23/14 0022 05/23/14 0449 05/23/14 0750  GLUCAP 362* 271* 218*    Scheduled Meds: . antiseptic oral rinse  7 mL Mouth Rinse QID  . artificial tears  1 application Both Eyes 3 times per day  . aspirin EC  81 mg Oral Daily  . azithromycin  500 mg Intravenous Q24H  . cefTRIAXone (ROCEPHIN)  IV  1 g Intravenous Q24H  . chlorhexidine  15 mL Mouth Rinse  BID  . enoxaparin (LOVENOX) injection  40 mg Subcutaneous Q24H  . furosemide  40 mg Intravenous Q12H  . insulin aspart  0-20 Units Subcutaneous 6 times per day  . insulin glargine  15 Units Subcutaneous Daily  . levothyroxine  150 mcg Per Tube QAC breakfast  . oseltamivir  75 mg Per Tube Q12H  . pantoprazole sodium  40 mg Per Tube Q24H    Continuous Infusions: . cisatracurium (NIMBEX) infusion 3 mcg/kg/min (05/23/14 1205)  . feeding supplement (VITAL AF 1.2 CAL) 1,000 mL (05/23/14 1110)  . fentaNYL infusion INTRAVENOUS 125 mcg/hr (05/23/14 1205)  . midazolam (VERSED) infusion Stopped (05/23/14 1000)  . norepinephrine (LEVOPHED) Adult infusion 3 mcg/min (05/23/14 1205)  . propofol 15 mcg/kg/min (05/23/14 1205)    Past Medical History  Diagnosis Date   . Diabetes mellitus   . Hypothyroidism   . Hypertension   . Hyperlipidemia     Past Surgical History  Procedure Laterality Date  . Back surgery      Laurette Schimke [place], [place], [place]

## 2014-05-23 NOTE — Progress Notes (Signed)
[facility] FOR [facility] LAB REPLACEMENT ONLY  The patient does [facility] based on the criteria listed below:   1. Is GFR >/= 40 ml/min? Yes.    Patient's GFR today is 68 2. Is urine output >/= 0.5 ml/kg/hr for the last 6 hours? Yes.   Patient's UOP is 1.06 ml/kg/hr 3. Is BUN < 60 mg/dL? Yes.    Patient's BUN today is 13 4. Abnormal electrolyte(s): K3.5 5. Ordered repletion with: Kcl-tube 6. If a panic level lab has been reported, has the CCM MD in charge been notified? Yes.  .   Physician:  Sommer,MD  Melrose NakayamaChisholm, Tonie Vizcarrondo William 05/23/2014 6:23 AM

## 2014-05-23 NOTE — Progress Notes (Signed)
Versed 25 ml wasted.  Witness Iran OuchStephanie Russell, RN.

## 2014-05-23 NOTE — Procedures (Signed)
Arterial Catheter Insertion Procedure Note Grace BoschLinda K Mitchell [id number] 29-Apr-1945  Procedure: Insertion of Arterial Catheter  Indications: Blood pressure monitoring and Frequent blood sampling  Procedure Details Consent: Risks of procedure as well as the alternatives and risks of each were explained to the (patient/caregiver).  Consent for procedure obtained. Time Out: Verified patient identification, verified procedure, site/side was marked, verified correct patient position, special equipment/implants available, medications/allergies/relevent history reviewed, required imaging and test results available.  Performed  Maximum sterile technique was used including antiseptics, cap, gloves, gown, hand hygiene, mask and sheet. Skin prep: Chlorhexidine; local anesthetic administered 20 gauge catheter was inserted into left radial artery using the Seldinger technique.  Evaluation Blood flow good; BP tracing good. Complications: No apparent complications.   Berton Boneugent, Cory Kitt Renee 05/23/2014

## 2014-05-23 NOTE — Progress Notes (Signed)
RT called to bedside to troubleshoot A-line.  RT could not salvage and removed.  RT to replace Aline due to Pt being on ARDS protocol and pressors.  RT to monitor and assess as needed.

## 2014-05-23 NOTE — Progress Notes (Signed)
BIS score 28-43 throughout the day.  Titrated sedation to bring bis score to 40 or greater.  HR 120-135 continuous today and last night. Notified Dr. Curley Spice. McQuaid, PCCM, Elink.  Instructed to increase sedation inspite of BIS score to reduce HR and possibly make patient more comfortable.

## 2014-05-24 ENCOUNTER — Inpatient Hospital Stay (HOSPITAL_COMMUNITY): Payer: Commercial Managed Care - HMO

## 2014-05-24 DIAGNOSIS — J189 Pneumonia, unspecified organism: Secondary | ICD-10-CM | POA: Insufficient documentation

## 2014-05-24 DIAGNOSIS — J9601 Acute respiratory failure with hypoxia: Secondary | ICD-10-CM | POA: Insufficient documentation

## 2014-05-24 LAB — BLOOD GAS, ARTERIAL
Acid-base deficit: 4 mmol/L — ABNORMAL HIGH (ref 0.0–2.0)
Acid-base deficit: 1 mmol/L (ref 0.0–2.0)
BICARBONATE: 24.1 meq/L — AB (ref 20.0–24.0)
Bicarbonate: 22.1 mEq/L (ref 20.0–24.0)
Drawn by: 11249
FIO2: 0.5 %
FIO2: 0.5 %
LHR: 30 {breaths}/min
MECHVT: 450 mL
O2 SAT: 90.3 %
O2 Saturation: 94 %
PATIENT TEMPERATURE: 102.3
PATIENT TEMPERATURE: 98.6
PCO2 ART: 45.4 mmHg — AB (ref 35.0–45.0)
PEEP/CPAP: 8 cmH2O
PEEP: 8 cmH2O
PH ART: 7.345 — AB (ref 7.350–7.450)
PO2 ART: 73.4 mmHg — AB (ref 80.0–100.0)
RATE: 30 resp/min
TCO2: 20.5 mmol/L (ref 0–100)
TCO2: 23.2 mmol/L (ref 0–100)
VT: 450 mL
pCO2 arterial: 51.8 mmHg — ABNORMAL HIGH (ref 35.0–45.0)
pH, Arterial: 7.266 — ABNORMAL LOW (ref 7.350–7.450)
pO2, Arterial: 77.2 mmHg — ABNORMAL LOW (ref 80.0–100.0)

## 2014-05-24 LAB — BASIC METABOLIC PANEL
Anion gap: 10 (ref 5–15)
BUN: 19 mg/dL (ref 6–23)
CHLORIDE: 107 mmol/L (ref 96–112)
CO2: 23 mmol/L (ref 19–32)
Calcium: 7.7 mg/dL — ABNORMAL LOW (ref 8.4–10.5)
Creatinine, Ser: 0.95 mg/dL (ref 0.50–1.10)
GFR calc Af Amer: 71 mL/min — ABNORMAL LOW (ref >90)
GFR calc non Af Amer: 61 mL/min — ABNORMAL LOW (ref >90)
Glucose, Bld: 355 mg/dL — ABNORMAL HIGH (ref 70–99)
Potassium: 3.7 mmol/L (ref 3.5–5.1)
SODIUM: 140 mmol/L (ref 135–145)

## 2014-05-24 LAB — GLUCOSE, CAPILLARY
GLUCOSE-CAPILLARY: 277 mg/dL — AB (ref 70–99)
GLUCOSE-CAPILLARY: 293 mg/dL — AB (ref 70–99)
Glucose-Capillary: 312 mg/dL — ABNORMAL HIGH (ref 70–99)
Glucose-Capillary: 296 mg/dL — ABNORMAL HIGH (ref 70–99)
Glucose-Capillary: 297 mg/dL — ABNORMAL HIGH (ref 70–99)
Glucose-Capillary: 266 mg/dL — ABNORMAL HIGH (ref 70–99)
Glucose-Capillary: 294 mg/dL — ABNORMAL HIGH (ref 70–99)

## 2014-05-24 LAB — CBC
HCT: 25.9 % — ABNORMAL LOW (ref 36.0–46.0)
HEMOGLOBIN: 8 g/dL — AB (ref 12.0–15.0)
MCH: 25.7 pg — ABNORMAL LOW (ref 26.0–34.0)
MCHC: 30.9 g/dL (ref 30.0–36.0)
MCV: 83.3 fL (ref 78.0–100.0)
Platelets: 222 10*3/uL (ref 150–400)
RBC: 3.11 MIL/uL — AB (ref 3.87–5.11)
RDW: 14.2 % (ref 11.5–15.5)
WBC: 11.1 10*3/uL — AB (ref 4.0–10.5)

## 2014-05-24 MED ORDER — OSELTAMIVIR PHOSPHATE 6 MG/ML PO SUSR
150.0000 mg | Freq: Two times a day (BID) | ORAL | Status: AC
  Administered 2014-05-24 – 2014-05-25 (×4): 150 mg
  Filled 2014-05-24 (×5): qty 25

## 2014-05-24 MED ORDER — ASPIRIN 81 MG PO CHEW
81.0000 mg | CHEWABLE_TABLET | Freq: Every day | ORAL | Status: DC
Start: 2014-05-24 — End: 2014-06-13
  Administered 2014-05-24 – 2014-06-13 (×18): 81 mg via ORAL
  Filled 2014-05-24 (×18): qty 1

## 2014-05-24 MED ORDER — INSULIN GLARGINE 100 UNIT/ML ~~LOC~~ SOLN
25.0000 [IU] | Freq: Every day | SUBCUTANEOUS | Status: DC
  Administered 2014-05-24: 25 [IU] via SUBCUTANEOUS
  Filled 2014-05-24 (×2): qty 0.25

## 2014-05-24 NOTE — Progress Notes (Signed)
PHARMACY CONSULT: Lovenox for VTE prophylaxis   3/19: Pt remains on lovenox 40mg  sq q24h  Renal fxn stable, CrCl >30 ml/hr  CBC: Hgb trending down, today 8, plts WNL  Plan: Continue Lovenox 40 mg SQ q24h. Pharmacy will sign-off.  Please re-consult if further dosing assistance needed.   Haynes Hoehnolleen Krist Rosenboom, PharmD, BCPS 05/24/2014, 11:40 AM  Pager: [phone]

## 2014-05-24 NOTE — Progress Notes (Signed)
PULMONARY / CRITICAL CARE MEDICINE   Name: Grace BoschLinda K Mccallum MRN: [id number] DOB: Jul 24, 1945    ADMISSION DATE:  05/20/2014 CONSULTATION DATE:  05/24/2014  REFERRING MD :  EDP  CHIEF COMPLAINT:  Body aches, SOB, chills, fever  INITIAL PRESENTATION:  69 yo female smoker with dyspnea, fever (Tm103F), chills, malaise from H1N1 influenza & pneumococcal PNA and sepsis.  STUDIES:   SIGNIFICANT EVENTS: 3/15 admit 3/17 paralytic for worsening pO2/FIO2  SUBJECTIVE:   Remains paralyzed  VITAL SIGNS: Temp:  [100.4 F (38 C)-102.4 F (39.1 C)] 101.8 F (38.8 C) (03/19 0630) Pulse Rate:  [115-136] 120 (03/19 0630) Resp:  [30] 30 (03/19 0630) BP: (129-137)/(54-56) 129/54 mmHg (03/18 1100) SpO2:  [87 %-97 %] 92 % (03/19 0630) FiO2 (%):  [50 %-60 %] 50 % (03/19 0820) Weight:  [82.6 kg (182 lb 1.6 oz)] 82.6 kg (182 lb 1.6 oz) (03/19 0600) INTAKE / OUTPUT: Intake/Output      03/18 0701 - 03/19 0700 03/19 0701 - 03/20 0700   I.V. (mL/kg) 1388.4 (16.8)    NG/GT 1495    IV Piggyback 300    Total Intake(mL/kg) 3183.4 (38.5)    Urine (mL/kg/hr) 2100 (1.1)    Stool 0 (0)    Total Output 2100     Net +1083.4          Stool Occurrence 2 x      PHYSICAL EXAMINATION: General: sedated, intubated, paralysed rass -5 Neuro: BIS 20, perr sluggish HEENT: oral ETT, jvd  Cardiovascular:  s1 s2 regular, tachycardic Lungs: diffuse rhonchi, coarse Abdomen: soft, non tender, no r/g, no r Musculoskeletal: no edema Skin: no rashes  LABS:  CBC  Recent Labs Lab 05/22/14 0430 05/23/14 0500 05/24/14 0440  WBC 13.8* 12.5* 11.1*  HGB 9.2* 8.6* 8.0*  HCT 30.0* 27.6* 25.9*  PLT 204 231 222   Coag's  Recent Labs Lab 05/20/14 1627  INR 1.15   BMET  Recent Labs Lab 05/22/14 0430 05/23/14 0500 05/24/14 0440  NA 136 140 140  K 3.6 3.5 3.7  CL 108 110 107  CO2 21 22 23   BUN 11 13 19   CREATININE 0.87 0.87 0.95  GLUCOSE 261* 247* 355*   Electrolytes  Recent Labs Lab 05/21/14 0345  05/22/14 0430 05/23/14 0500 05/24/14 0440  CALCIUM 7.5* 7.2* 7.3* 7.7*  MG 1.3*  --   --   --   PHOS 2.1*  --   --   --    Sepsis Markers  Recent Labs Lab 05/20/14 1629 05/20/14 2140 05/22/14 0430  LATICACIDVEN 3.31* 2.5*  --   PROCALCITON  --  6.94 9.26   ABG  Recent Labs Lab 05/22/14 1051 05/23/14 0345 05/24/14 0500  PHART 7.270* 7.323* 7.266*  PCO2ART 43.0 43.0 51.8*  PO2ART 66.7* 66.3* 73.4*   Liver Enzymes  Recent Labs Lab 05/20/14 1627  AST 29  ALT 11  ALKPHOS 79  BILITOT 0.9  ALBUMIN 3.6   Cardiac Enzymes  Recent Labs Lab 05/20/14 1627 05/20/14 2140  TROPONINI <0.03 0.03   Glucose  Recent Labs Lab 05/22/14 2000 05/23/14 0022 05/23/14 0449 05/23/14 0750 05/23/14 1310 05/23/14 1540  GLUCAP 255* 362* 271* 218* 312* 277*    Imaging Dg Chest Port 1 View  05/24/2014   CLINICAL DATA:  ARDS  EXAM: PORTABLE CHEST - 1 VIEW  COMPARISON:  05/23/2014  FINDINGS: Endotracheal tube tip is 3 cm above the carina. Left jugular central line extends into the SVC. Nasogastric tube extends into the stomach. Diffuse  airspace opacities persist bilaterally without significant interval change.  IMPRESSION: Support equipment appears satisfactorily positioned.  No significant interval change in the bilateral airspace opacities.   Electronically Signed   By: Ellery Plunk M.D.   On: 05/24/2014 06:59   Dg Chest Port 1 View  05/23/2014   CLINICAL DATA:  ARDS  EXAM: PORTABLE CHEST - 1 VIEW  COMPARISON:  05/22/2014  FINDINGS: Endotracheal tube tip is 3 cm above the carina. Left jugular central line extends into the SVC. Nasogastric tube extends into the stomach. There is no interval change in the diffuse airspace opacities.  IMPRESSION: Support equipment appears satisfactorily positioned. No significant interval change in the diffuse airspace opacities.   Electronically Signed   By: Ellery Plunk M.D.   On: 05/23/2014 05:31     ASSESSMENT /  PLAN:  PULMONARY A: ARDS 2nd to H1N1 PNA. Tobacco abuse P:   ARDS protocol, keeping plat less 30  Ct nimbex until 3/19, required abg reviewed, rate to 35, abg in pm and am PRN BD's, only use if home med , as ARDS can worsen outcome Diurese on hold as levo remains, low threshold lasix start but O2 needs have improved  CARDIOVASCULAR A:  Sepsis 2nd to PNA. Hx HTN, HLD. P:  dc IV fluids , cvp 10 Levophed to map 60 Cortisol   RENAL A:   AKI from sepsis >> improved. P:   Monitor renal fx Lasix 40 q 12h, held cvp 10 , low threshold restart, assess levo needs in pm  GASTROINTESTINAL A:   Nutrition. P:   TFs to goal May need post pyloric,m so far tolerating  HEMATOLOGIC A:   Leukocytosis. P:  F/u CBC Lovenox for DVT prevention, crt in am   INFECTIOUS A:   Pneumonia with pneumococcal Ag positive. P:   Ct  rocephin, zithromax, tamiflu 3/15 >>  Pneumococcal Ag 3/15 >> positive Legionella Ag 3/15 >>neg Influenza PCR 3/15>> H1N1 pos c diff 3/18 >>neg  Consider double dose tamiflu with this degree of ards Maintain recephin Dc azithro  ENDOCRINE A:   Hx of DM, hypothyroidism.r/o rel AI P:   Continue synthroid SSI CBG's q4hr Increase lantus to 25  NEUROLOGIC A:   Anxiety, insomnia, chronic pain. Severe ards, vent dyschrony P:   PRN tylenol Hold outpatient amitriptyline, norco. Propofol /fent gtt If not off levo, then may need to transition off prop to versed again No WUA  Family - updated husband  Ccm time 35 min  Mcarthur Rossetti. Tyson Alias, MD, FACP Pgr: [phone] [place] [facility]

## 2014-05-25 ENCOUNTER — Inpatient Hospital Stay (HOSPITAL_COMMUNITY): Payer: Commercial Managed Care - HMO

## 2014-05-25 DIAGNOSIS — J189 Pneumonia, unspecified organism: Secondary | ICD-10-CM | POA: Insufficient documentation

## 2014-05-25 LAB — BLOOD GAS, ARTERIAL
ACID-BASE EXCESS: 0 mmol/L (ref 0.0–2.0)
BICARBONATE: 24.9 meq/L — AB (ref 20.0–24.0)
Drawn by: 232811
FIO2: 0.5 %
O2 Saturation: 93.7 %
PCO2 ART: 46.9 mmHg — AB (ref 35.0–45.0)
PEEP/CPAP: 8 cmH2O
PO2 ART: 76.2 mmHg — AB (ref 80.0–100.0)
Patient temperature: 38
RATE: 30 resp/min
TCO2: 23.9 mmol/L (ref 0–100)
VT: 450 mL
pH, Arterial: 7.35 (ref 7.350–7.450)

## 2014-05-25 LAB — BASIC METABOLIC PANEL
ANION GAP: 6 (ref 5–15)
BUN: 30 mg/dL — ABNORMAL HIGH (ref 6–23)
CO2: 27 mmol/L (ref 19–32)
Calcium: 8.3 mg/dL — ABNORMAL LOW (ref 8.4–10.5)
Chloride: 106 mmol/L (ref 96–112)
Creatinine, Ser: 0.84 mg/dL (ref 0.50–1.10)
GFR calc Af Amer: 82 mL/min — ABNORMAL LOW (ref >90)
GFR calc non Af Amer: 71 mL/min — ABNORMAL LOW (ref >90)
Glucose, Bld: 313 mg/dL — ABNORMAL HIGH (ref 70–99)
Potassium: 4.9 mmol/L (ref 3.5–5.1)
Sodium: 139 mmol/L (ref 135–145)

## 2014-05-25 LAB — COMPREHENSIVE METABOLIC PANEL
ALT: 32 U/L (ref 0–35)
ANION GAP: 10 (ref 5–15)
AST: 47 U/L — ABNORMAL HIGH (ref 0–37)
Albumin: 1.4 g/dL — ABNORMAL LOW (ref 3.5–5.2)
Alkaline Phosphatase: 148 U/L — ABNORMAL HIGH (ref 39–117)
BUN: 23 mg/dL (ref 6–23)
CO2: 23 mmol/L (ref 19–32)
CREATININE: 0.75 mg/dL (ref 0.50–1.10)
Calcium: 7.1 mg/dL — ABNORMAL LOW (ref 8.4–10.5)
Chloride: 111 mmol/L (ref 96–112)
GFR calc non Af Amer: 86 mL/min — ABNORMAL LOW (ref >90)
Glucose, Bld: 232 mg/dL — ABNORMAL HIGH (ref 70–99)
Potassium: 2.9 mmol/L — ABNORMAL LOW (ref 3.5–5.1)
SODIUM: 144 mmol/L (ref 135–145)
Total Bilirubin: 0.8 mg/dL (ref 0.3–1.2)
Total Protein: 4.9 g/dL — ABNORMAL LOW (ref 6.0–8.3)

## 2014-05-25 LAB — CBC WITH DIFFERENTIAL/PLATELET
Basophils Absolute: 0 10*3/uL (ref 0.0–0.1)
Basophils Relative: 0 % (ref 0–1)
EOS ABS: 0.2 10*3/uL (ref 0.0–0.7)
EOS PCT: 2 % (ref 0–5)
HCT: 23.3 % — ABNORMAL LOW (ref 36.0–46.0)
HEMOGLOBIN: 7.4 g/dL — AB (ref 12.0–15.0)
LYMPHS PCT: 12 % (ref 12–46)
Lymphs Abs: 1.4 10*3/uL (ref 0.7–4.0)
MCH: 26.1 pg (ref 26.0–34.0)
MCHC: 31.8 g/dL (ref 30.0–36.0)
MCV: 82 fL (ref 78.0–100.0)
MONO ABS: 0.8 10*3/uL (ref 0.1–1.0)
Monocytes Relative: 7 % (ref 3–12)
NEUTROS PCT: 79 % — AB (ref 43–77)
Neutro Abs: 9 10*3/uL — ABNORMAL HIGH (ref 1.7–7.7)
PLATELETS: 225 10*3/uL (ref 150–400)
RBC: 2.84 MIL/uL — ABNORMAL LOW (ref 3.87–5.11)
RDW: 14.1 % (ref 11.5–15.5)
WBC: 11.4 10*3/uL — ABNORMAL HIGH (ref 4.0–10.5)

## 2014-05-25 LAB — GLUCOSE, CAPILLARY
GLUCOSE-CAPILLARY: 303 mg/dL — AB (ref 70–99)
Glucose-Capillary: 236 mg/dL — ABNORMAL HIGH (ref 70–99)
Glucose-Capillary: 264 mg/dL — ABNORMAL HIGH (ref 70–99)
Glucose-Capillary: 275 mg/dL — ABNORMAL HIGH (ref 70–99)
Glucose-Capillary: 278 mg/dL — ABNORMAL HIGH (ref 70–99)
Glucose-Capillary: 279 mg/dL — ABNORMAL HIGH (ref 70–99)
Glucose-Capillary: 292 mg/dL — ABNORMAL HIGH (ref 70–99)
Glucose-Capillary: 321 mg/dL — ABNORMAL HIGH (ref 70–99)

## 2014-05-25 LAB — CORTISOL: CORTISOL PLASMA: 25 ug/dL

## 2014-05-25 LAB — MAGNESIUM: Magnesium: 1.5 mg/dL (ref 1.5–2.5)

## 2014-05-25 LAB — PHOSPHORUS: Phosphorus: 1.8 mg/dL — ABNORMAL LOW (ref 2.3–4.6)

## 2014-05-25 MED ORDER — FUROSEMIDE 10 MG/ML IJ SOLN
40.0000 mg | Freq: Three times a day (TID) | INTRAMUSCULAR | Status: DC
  Administered 2014-05-25 – 2014-05-28 (×9): 40 mg via INTRAVENOUS
  Filled 2014-05-25 (×9): qty 4

## 2014-05-25 MED ORDER — INSULIN ASPART 100 UNIT/ML ~~LOC~~ SOLN
3.0000 [IU] | SUBCUTANEOUS | Status: DC
  Administered 2014-05-25 – 2014-06-10 (×89): 3 [IU] via SUBCUTANEOUS

## 2014-05-25 MED ORDER — POTASSIUM CHLORIDE 20 MEQ/15ML (10%) PO SOLN
40.0000 meq | ORAL | Status: AC
  Administered 2014-05-25 (×2): 40 meq
  Filled 2014-05-25 (×2): qty 30

## 2014-05-25 MED ORDER — INSULIN GLARGINE 100 UNIT/ML ~~LOC~~ SOLN
35.0000 [IU] | Freq: Every day | SUBCUTANEOUS | Status: DC
  Administered 2014-05-25 – 2014-05-26 (×2): 35 [IU] via SUBCUTANEOUS
  Filled 2014-05-25 (×3): qty 0.35

## 2014-05-25 MED ORDER — POTASSIUM CHLORIDE 10 MEQ/50ML IV SOLN
10.0000 meq | INTRAVENOUS | Status: AC
  Administered 2014-05-25 (×4): 10 meq via INTRAVENOUS
  Filled 2014-05-25 (×4): qty 50

## 2014-05-25 NOTE — Progress Notes (Signed)
PULMONARY / CRITICAL CARE MEDICINE   Name: Grace Mitchell MRN: [id number] DOB: 1945-08-24    ADMISSION DATE:  05/20/2014 CONSULTATION DATE:  05/25/2014  REFERRING MD :  EDP  CHIEF COMPLAINT:  Body aches, SOB, chills, fever  INITIAL PRESENTATION:  69 yo female smoker with dyspnea, fever (Tm103F), chills, malaise from H1N1 influenza & pneumococcal PNA and sepsis.  STUDIES:   SIGNIFICANT EVENTS: 3/15 admit 3/17 paralytic for worsening pO2/FIO2  SUBJECTIVE:  Paralysis remains, rate remains 30, peep 8  VITAL SIGNS: Temp:  [100.4 F (38 C)-104.2 F (40.1 C)] 100.8 F (38.2 C) (03/20 0700) Pulse Rate:  [117-137] 117 (03/20 0700) Resp:  [30] 30 (03/20 0700) BP: (90-162)/(47-59) 162/59 mmHg (03/20 0402) SpO2:  [87 %-97 %] 96 % (03/20 0700) FiO2 (%):  [40 %-50 %] 40 % (03/20 0817) Weight:  [80.1 kg (176 lb 9.4 oz)] 80.1 kg (176 lb 9.4 oz) (03/20 0421) INTAKE / OUTPUT: Intake/Output      03/19 0701 - 03/20 0700 03/20 0701 - 03/21 0700   I.V. (mL/kg) 1371.3 (17.1)    Other 0    NG/GT 1235    IV Piggyback     Total Intake(mL/kg) 2606.3 (32.5)    Urine (mL/kg/hr) 2450 (1.3) 425 (3.9)   Emesis/NG output 0 (0)    Stool     Total Output 2450 425   Net +156.3 -425          PHYSICAL EXAMINATION: General: sedated, intubated, paralysed rass -5 Neuro: perr sluggish HEENT: oral ETT, jvd increased Cardiovascular:  s1 s2 regular, tachycardic Lungs: diffuse rhonchi unchanged Abdomen: soft, non tender, no r/g, no r Musculoskeletal: mild edema Skin: no rashes  LABS:  CBC  Recent Labs Lab 05/23/14 0500 05/24/14 0440 05/25/14 0635  WBC 12.5* 11.1* 11.4*  HGB 8.6* 8.0* 7.4*  HCT 27.6* 25.9* 23.3*  PLT 231 222 225   Coag's  Recent Labs Lab 05/20/14 1627  INR 1.15   BMET  Recent Labs Lab 05/23/14 0500 05/24/14 0440 05/25/14 0635  NA 140 140 144  K 3.5 3.7 2.9*  CL 110 107 111  CO2 BUN CREATININE 0.87 0.95 0.75  GLUCOSE 247* 355* 232*    Electrolytes  Recent Labs Lab 05/21/14 0345  05/23/14 0500 05/24/14 0440 05/25/14 0635  CALCIUM 7.5*  < > 7.3* 7.7* 7.1*  MG 1.3*  --   --   --   --   PHOS 2.1*  --   --   --   --   < > = values in this interval not displayed. Sepsis Markers  Recent Labs Lab 05/20/14 1629 05/20/14 2140 05/22/14 0430  LATICACIDVEN 3.31* 2.5*  --   PROCALCITON  --  6.94 9.26   ABG  Recent Labs Lab 05/24/14 0500 05/24/14 1857 05/25/14 0405  PHART 7.266* 7.345* 7.350  PCO2ART 51.8* 45.4* 46.9*  PO2ART 73.4* 77.2* 76.2*   Liver Enzymes  Recent Labs Lab 05/20/14 1627 05/25/14 0635  AST 29 47*  ALT 11 32  ALKPHOS 79 148*  BILITOT 0.9 0.8  ALBUMIN 3.6 1.4*   Cardiac Enzymes  Recent Labs Lab 05/20/14 1627 05/20/14 2140  TROPONINI <0.03 0.03   Glucose  Recent Labs Lab 05/24/14 0848 05/24/14 1243 05/24/14 1703 05/24/14 1955 05/25/14 0115 05/25/14 0406  GLUCAP 296* 266* 294* 275* 303* 292*    Imaging Dg Chest Port 1 View  05/25/2014   CLINICAL DATA:  Followup influenza with bilateral lung infiltrates.  EXAM:  PORTABLE CHEST - 1 VIEW  COMPARISON:  05/24/2014  FINDINGS: Bilateral airspace lung opacities have shown a minimal degree of improvement since the previous day's study. No new lung abnormalities. No pneumothorax.  Endotracheal tube, left internal jugular central venous line and nasogastric tube are stable in well positioned.  IMPRESSION: Minimal improvement in bilateral lung opacities since the previous day's study. No other change.   Electronically Signed   By: Amie Portlandavid  Ormond M.D.   On: 05/25/2014 07:10   Dg Chest Port 1 View  05/24/2014   CLINICAL DATA:  ARDS  EXAM: PORTABLE CHEST - 1 VIEW  COMPARISON:  05/23/2014  FINDINGS: Endotracheal tube tip is 3 cm above the carina. Left jugular central line extends into the SVC. Nasogastric tube extends into the stomach. Diffuse airspace opacities persist bilaterally without significant interval change.  IMPRESSION: Support  equipment appears satisfactorily positioned.  No significant interval change in the bilateral airspace opacities.   Electronically Signed   By: Ellery Plunkaniel R Mitchell M.D.   On: 05/24/2014 06:59     ASSESSMENT / PLAN:  PULMONARY A: ARDS 2nd to H1N1 PNA. Tobacco abuse P:   ARDS protocol, keeping plat less 30  Dc nimbex, see neuro abg reviewed, keep same MV, likely can get to peep 5 50% After paralysis off, could SBT likely in evening PRN BD's dc NOT a home med Off levo this am , lasix  CARDIOVASCULAR A:  Sepsis 2nd to PNA. Hx HTN, HLD. P:  Levophed to map 60 - off Cortisol adaquate Lasix to q8h   RENAL A:   AKI from sepsis >> improved. hypoK P:   Monitor renal fx Lasix 40 to q8h as was even bid k , mag, phos in pm bmet in am  kvo  GASTROINTESTINAL A:   Nutrition. P:   TFs to goal Stools were excessive now improved  HEMATOLOGIC A:   Leukocytosis. Dilutional anemia P:  F/u CBC Lovenox lasix  INFECTIOUS A:   Pneumonia with pneumococcal Ag positive. P:   Rocephin 3/15>>> 3/15 zithromax>>> 3/19  tamiflu 3/15 >>3/19 double dose>>>  Pneumococcal Ag 3/15 >> positive Legionella Ag 3/15 >>neg Influenza PCR 3/15>> H1N1 pos c diff 3/18 >>neg Consider 10 days tamiflu increased dose tamiflu 3/10, needs daily renal fxn Maintain recephin  ENDOCRINE A:   Hx of DM, hypothyroidism.r/o rel AI Uncontrolled glucose P:   Continue synthroid SSI CBG's q4hr Increase lantus to 35 Add TF coverage No role roids  NEUROLOGIC A:   Anxiety, insomnia, chronic pain. Severe ards, vent dyschrony P:   PRN tylenol Hold outpatient amitriptyline, norco. Propofol /fent gtt Dc nimbex WUA after dc paralysis  Family - updated husband  Ccm time 30 min  Mcarthur RossettiDaniel J. Tyson AliasFeinstein, MD, FACP Pgr: [phone] [facility]

## 2014-05-25 NOTE — Progress Notes (Signed)
ANTIBIOTIC CONSULT NOTE - FOLLOW UP  Pharmacy Consult for Ceftriaxone Indication:   No Known Allergies  Patient Measurements: Height: 5\' 4"  (162.6 cm) Weight: 176 lb 9.4 oz (80.1 kg) IBW/kg (Calculated) : 54.7 Adjusted Body Weight:   Vital Signs: Temp: 100.8 F (38.2 C) (03/20 0800) BP: 162/59 mmHg (03/20 0402) Pulse Rate: 117 (03/20 0700) Intake/Output from previous day: 03/19 0701 - 03/20 0700 In: 2606.3 [I.V.:1371.3; NG/GT:1235] Out: 2450 [Urine:2450] Intake/Output from this shift: Total I/O In: 860.7 [I.V.:405.7; NG/GT:455] Out: 490 [Urine:490]  Labs:  Recent Labs  05/23/14 0500 05/24/14 0440 05/25/14 0635  WBC 12.5* 11.1* 11.4*  HGB 8.6* 8.0* 7.4*  PLT 231 222 225  CREATININE 0.87 0.95 0.75   Estimated Creatinine Clearance: 70.9 mL/min (by C-G formula based on Cr of 0.75). No results for input(s): VANCOTROUGH, VANCOPEAK, VANCORANDOM, GENTTROUGH, GENTPEAK, GENTRANDOM, TOBRATROUGH, TOBRAPEAK, TOBRARND, AMIKACINPEAK, AMIKACINTROU, AMIKACIN in the last 72 hours.   Microbiology: Recent Results (from the past 720 hour(s))  Urine culture     Status: None   Collection Time: 05/20/14  3:49 PM  Result Value Ref Range Status   Specimen Description URINE, CLEAN CATCH  Final   Special Requests NONE  Final   Colony Count   Final    30,000 COLONIES/ML Performed at [facility]    Culture   Final    Multiple bacterial morphotypes present, none predominant. Suggest appropriate recollection if clinically indicated. Performed at [facility]    Report Status 05/21/2014 FINAL  Final  MRSA PCR Screening     Status: None   Collection Time: 05/20/14  4:04 PM  Result Value Ref Range Status   MRSA by PCR NEGATIVE NEGATIVE Final    Comment:        The GeneXpert MRSA Assay (FDA approved for NASAL specimens only), is one component of a comprehensive MRSA colonization surveillance program. It is not intended to diagnose MRSA infection nor to guide  or monitor treatment for MRSA infections.   Blood Culture (routine x 2)     Status: None (Preliminary result)   Collection Time: 05/20/14  4:26 PM  Result Value Ref Range Status   Specimen Description BLOOD LEFT ANTECUBITAL  Final   Special Requests BOTTLES DRAWN AEROBIC AND ANAEROBIC 5ML  Final   Culture   Final           BLOOD CULTURE RECEIVED NO GROWTH TO DATE CULTURE WILL BE HELD FOR 5 DAYS BEFORE ISSUING A FINAL NEGATIVE REPORT Performed at [facility]    Report Status PENDING  Incomplete  Blood Culture (routine x 2)     Status: None (Preliminary result)   Collection Time: 05/20/14  4:34 PM  Result Value Ref Range Status   Specimen Description BLOOD LEFT HAND  Final   Special Requests BOTTLES DRAWN AEROBIC AND ANAEROBIC 4CC  Final   Culture   Final           BLOOD CULTURE RECEIVED NO GROWTH TO DATE CULTURE WILL BE HELD FOR 5 DAYS BEFORE ISSUING A FINAL NEGATIVE REPORT Performed at [facility]    Report Status PENDING  Incomplete  Culture, respiratory (NON-Expectorated)     Status: None   Collection Time: 05/21/14 12:32 PM  Result Value Ref Range Status   Specimen Description TRACHEAL ASPIRATE  Final   Special Requests Normal  Final   Gram Stain   Final    FEW WBC PRESENT,BOTH PMN AND MONONUCLEAR RARE SQUAMOUS EPITHELIAL CELLS PRESENT NO ORGANISMS SEEN Performed  at [facility]    Culture   Final    NO GROWTH 2 DAYS Performed at [facility]    Report Status 05/23/2014 FINAL  Final  Clostridium Difficile by PCR     Status: None   Collection Time: 05/23/14  8:56 AM  Result Value Ref Range Status   C difficile by pcr NEGATIVE NEGATIVE Final    Anti-infectives    Start     Dose/Rate Route Frequency Ordered Stop   05/24/14 1000  oseltamivir (TAMIFLU) 6 MG/ML suspension 150 mg     150 mg Per Tube Every 12 hours 05/24/14 0916 05/26/14 0959   05/21/14 1400  oseltamivir (TAMIFLU) 6 MG/ML suspension 75 mg  Status:  Discontinued     75 mg  Per Tube Every 12 hours 05/21/14 1258 05/24/14 0916   05/20/14 2200  oseltamivir (TAMIFLU) capsule 75 mg  Status:  Discontinued     75 mg Oral 2 times daily 05/20/14 1918 05/21/14 1258   05/20/14 2200  cefTRIAXone (ROCEPHIN) 1 g in dextrose 5 % 50 mL IVPB     1 g 100 mL/hr over 30 Minutes Intravenous Every 24 hours 05/20/14 1950     05/20/14 2000  azithromycin (ZITHROMAX) 500 mg in dextrose 5 % 250 mL IVPB  Status:  Discontinued     500 mg 250 mL/hr over 60 Minutes Intravenous Every 24 hours 05/20/14 1950 05/24/14 0916   05/20/14 1830  oseltamivir (TAMIFLU) capsule 75 mg  Status:  Discontinued     75 mg Oral Daily 05/20/14 1825 05/20/14 1918   05/20/14 1630  piperacillin-tazobactam (ZOSYN) IVPB 3.375 g     3.375 g 100 mL/hr over 30 Minutes Intravenous  Once 05/20/14 1623 05/20/14 1711   05/20/14 1630  vancomycin (VANCOCIN) IVPB 1000 mg/200 mL premix     1,000 mg 200 mL/hr over 60 Minutes Intravenous  Once 05/20/14 1623 05/20/14 1813      Assessment: 69 yo female smoker with dyspnea, fever (Tm103F), chills, malaise from H1N1 influenza & pneumococcal PNA and sepsis, currently on ceftriaxone and tamiflu.  Pharmacy consulted to assist with ceftriaxone dosing.    D6 antibiotics:  3/15 >>Vanc >> 3/15 3/15 >>Zosyn >> 3/15 3/15 >> Ceftriaxone >> 3/15 >> Azithromycin >> 3/19 3/15 >> Tamiflu >>  Tmax: persistent fever, 102 WBCs: sl elevated Renal: Scr improved to WNL PCT 6.94 > 9.26 LA 3.31 (3/15)  3/16 blood x 2: ngtd 3/16 urine: 30K cfu/ml, no predominant species 3/16 influenza panel: + influenza A, H1N1 3/15 strep pneumo ur ag: POSITIVE 3/15 legionella ur ag: neg 3/16 trach asp cx: ng - final 3/18 cdiff negative  Drug level / dose changes info:  3/20: D6 Abx - now CTX and tamiflu  q12h for sepsis 2/2 pneumococcal pna/CAP/flu in pt with ARDS.  PCT increasing, patient remains febrile, intubated.  Plan to wean pressors and off nimbex today.  CCM considering 10 day course  tamiflu  Goal of Therapy:  Eradication of infection  Plan:  Continue ceftriaxone 1g IV q24h Pharmacy will sign-off.  Please re-consult if further dosing assistance needed.    Haynes Hoehn, PharmD, BCPS 05/25/2014, 9:14 AM  Pager: [phone]

## 2014-05-26 ENCOUNTER — Inpatient Hospital Stay (HOSPITAL_COMMUNITY): Payer: Commercial Managed Care - HMO

## 2014-05-26 LAB — BLOOD GAS, ARTERIAL
ACID-BASE EXCESS: 4.5 mmol/L — AB (ref 0.0–2.0)
ACID-BASE EXCESS: 7.8 mmol/L — AB (ref 0.0–2.0)
ACID-BASE EXCESS: 9.4 mmol/L — AB (ref 0.0–2.0)
BICARBONATE: 34.1 meq/L — AB (ref 20.0–24.0)
Bicarbonate: 29.1 mEq/L — ABNORMAL HIGH (ref 20.0–24.0)
Bicarbonate: 32.1 mEq/L — ABNORMAL HIGH (ref 20.0–24.0)
Drawn by: 232811
Drawn by: 307971
Drawn by: 307971
FIO2: 0.5 %
FIO2: 0.5 %
FIO2: 0.5 %
LHR: 30 {breaths}/min
MECHVT: 450 mL
O2 SAT: 94.4 %
O2 Saturation: 97.7 %
O2 Saturation: 97 %
PATIENT TEMPERATURE: 38.8
PCO2 ART: 50 mmHg — AB (ref 35.0–45.0)
PCO2 ART: 46.4 mmHg — AB (ref 35.0–45.0)
PCO2 ART: 49.6 mmHg — AB (ref 35.0–45.0)
PEEP: 8 cmH2O
PEEP: 8 cmH2O
PEEP: 8 cmH2O
PH ART: 7.455 — AB (ref 7.350–7.450)
PO2 ART: 84 mmHg (ref 80.0–100.0)
PO2 ART: 105 mmHg — AB (ref 80.0–100.0)
Patient temperature: 98.6
Patient temperature: 98.6
RATE: 30 resp/min
RATE: 30 resp/min
TCO2: 27 mmol/L (ref 0–100)
TCO2: 30.4 mmol/L (ref 0–100)
TCO2: 32.2 mmol/L (ref 0–100)
VT: 380 mL
VT: 330 mL
pH, Arterial: 7.392 (ref 7.350–7.450)
pH, Arterial: 7.451 — ABNORMAL HIGH (ref 7.350–7.450)
pO2, Arterial: 96.1 mmHg (ref 80.0–100.0)

## 2014-05-26 LAB — GLUCOSE, CAPILLARY
GLUCOSE-CAPILLARY: 290 mg/dL — AB (ref 70–99)
GLUCOSE-CAPILLARY: 185 mg/dL — AB (ref 70–99)
Glucose-Capillary: 230 mg/dL — ABNORMAL HIGH (ref 70–99)
Glucose-Capillary: 239 mg/dL — ABNORMAL HIGH (ref 70–99)
Glucose-Capillary: 249 mg/dL — ABNORMAL HIGH (ref 70–99)
Glucose-Capillary: 271 mg/dL — ABNORMAL HIGH (ref 70–99)
Glucose-Capillary: 309 mg/dL — ABNORMAL HIGH (ref 70–99)

## 2014-05-26 LAB — BASIC METABOLIC PANEL
ANION GAP: 10 (ref 5–15)
BUN: 33 mg/dL — ABNORMAL HIGH (ref 6–23)
CHLORIDE: 103 mmol/L (ref 96–112)
CO2: 29 mmol/L (ref 19–32)
CREATININE: 0.82 mg/dL (ref 0.50–1.10)
Calcium: 8.5 mg/dL (ref 8.4–10.5)
GFR calc Af Amer: 85 mL/min — ABNORMAL LOW (ref >90)
GFR calc non Af Amer: 73 mL/min — ABNORMAL LOW (ref >90)
Glucose, Bld: 245 mg/dL — ABNORMAL HIGH (ref 70–99)
POTASSIUM: 4.1 mmol/L (ref 3.5–5.1)
SODIUM: 142 mmol/L (ref 135–145)

## 2014-05-26 LAB — CBC WITH DIFFERENTIAL/PLATELET
Basophils Absolute: 0 10*3/uL (ref 0.0–0.1)
Basophils Relative: 0 % (ref 0–1)
Eosinophils Absolute: 0.2 10*3/uL (ref 0.0–0.7)
Eosinophils Relative: 1 % (ref 0–5)
HEMATOCRIT: 25.5 % — AB (ref 36.0–46.0)
Hemoglobin: 8.1 g/dL — ABNORMAL LOW (ref 12.0–15.0)
LYMPHS ABS: 1.6 10*3/uL (ref 0.7–4.0)
Lymphocytes Relative: 11 % — ABNORMAL LOW (ref 12–46)
MCH: 26.2 pg (ref 26.0–34.0)
MCHC: 31.8 g/dL (ref 30.0–36.0)
MCV: 82.5 fL (ref 78.0–100.0)
MONOS PCT: 5 % (ref 3–12)
Monocytes Absolute: 0.8 10*3/uL (ref 0.1–1.0)
NEUTROS ABS: 12.1 10*3/uL — AB (ref 1.7–7.7)
NEUTROS PCT: 83 % — AB (ref 43–77)
Platelets: 238 10*3/uL (ref 150–400)
RBC: 3.09 MIL/uL — ABNORMAL LOW (ref 3.87–5.11)
RDW: 14 % (ref 11.5–15.5)
WBC: 14.6 10*3/uL — ABNORMAL HIGH (ref 4.0–10.5)

## 2014-05-26 LAB — CULTURE, BLOOD (ROUTINE X 2)
Culture: NO GROWTH
Culture: NO GROWTH

## 2014-05-26 MED ORDER — DEXMEDETOMIDINE HCL IN NACL 400 MCG/100ML IV SOLN
0.4000 ug/kg/h | INTRAVENOUS | Status: DC
  Administered 2014-05-26: 1.2 ug/kg/h via INTRAVENOUS
  Administered 2014-05-26: 0.5 ug/kg/h via INTRAVENOUS
  Administered 2014-05-26: 1.2 ug/kg/h via INTRAVENOUS
  Administered 2014-05-26: 1 ug/kg/h via INTRAVENOUS
  Administered 2014-05-27: 1.2 ug/kg/h via INTRAVENOUS
  Filled 2014-05-26 (×2): qty 50
  Filled 2014-05-26 (×4): qty 100
  Filled 2014-05-26: qty 50
  Filled 2014-05-26: qty 100

## 2014-05-26 NOTE — Progress Notes (Signed)
Patient showing signs of increased agitation including elevated blood pressures with systolic into the 200's, increased respirations (over the ventilator settings), and alarming the ventilator with minimal stimulation. RN has increased Fentanyl gtt rate throughout the night and signs of agitation continue. RN restarted Propofol gtt at 0300 to help decrease the patients agitation/anxiety/stimulation. Will continue to monitor for signs of agitation. Bosie HelperS. Ermine Stebbins, RN

## 2014-05-26 NOTE — Care Management Note (Signed)
  Page 1 of 1   05/26/2014     10:17:01 AM CARE MANAGEMENT NOTE 05/26/2014  Patient:  Grace Mitchell,Grace Mitchell   Account Number:  [id number]  Date Initiated:  05/21/2014  Documentation initiated by:  Mylena Sedberry  Subjective/Objective Assessment:   sepsis and influenza     Action/Plan:   home when stable   Anticipated DC Date:  05/29/2014   Anticipated DC Plan:  HOME/SELF CARE  In-house referral  NA      DC Planning Services  NA      [facility]   Choice offered to / List presented to:             Status of service:  In process, will continue to follow Medicare Important Message given?   (If response is "NO", the following Medicare IM given date fields will be blank) Date Medicare IM given:   Medicare IM given by:   Date Additional Medicare IM given:   Additional Medicare IM given by:    Discharge Disposition:    Per UR Regulation:  Reviewed for med. necessity/level of care/duration of stay  If discussed at Long Length of Stay Meetings, dates discussed:    Comments:  May 26, 2014/Sapphire Tygart L. Earlene Plateravis, RN, BSN, CCM. Case Management [facility] [phone] No discharge needs present of time of review. remains on the vent, 50%fio2, hypotensive, agitated on propofol iv drip.  May 21, 2014/Paullette Mckain L. Earlene Plateravis, RN, BSN, CCM. Case Management [facility] [phone] No discharge needs present of time of review.

## 2014-05-26 NOTE — Progress Notes (Signed)
Inpatient Diabetes Program Recommendations  AACE/ADA: New Consensus Statement on Inpatient Glycemic Control (2013)  Target Ranges:  Prepandial:   less than 140 mg/dL      Peak postprandial:   less than 180 mg/dL (1-2 hours)      Critically ill patients:  140 - 180 mg/dL   Reason for Assessment:  Results for Grace Mitchell, Grace Mitchell (MRN [id number]) as of 05/26/2014 10:31  Ref. Range 05/25/2014 11:55 05/25/2014 15:41 05/25/2014 23:49 05/26/2014 04:15 05/26/2014 07:52  Glucose-Capillary Latest Range: 70-99 mg/dL 409264 (H) 811278 (H) 914321 (H) 309 (H) 239 (H)   Diabetes history: Type 2 diabetes Outpatient Diabetes medications: Lantus 40 units q AM and 60 units q HS, Janumet 50-500 mg one daily Current orders for Inpatient glycemic control:  Lantus 35 units daily, Novolog 3 units q 4 hours to cover tube feeds, Novolog resist. q 4 hours.  May consider IV insulin. Note that CBG's remain greater than goal.  IV insulin would allow us to estimate subcutaneous insulin needs more accurately to control CBG's.    Thanks, Beryl MeagerJenny Katai Marsico, RN, BC-ADM Inpatient Diabetes Coordinator Pager [phone] (8a-5p)

## 2014-05-26 NOTE — Progress Notes (Signed)
Patient decreased to to 330 VT according to the original demographics of 5'4". Patient had good ABG but was breathing 35 and dysynchronous with the vent. Spoke with daughter and who said "she is at least 5'6". Measured patient and she was 5'6-7". Placed on VT of 360 which 6ml. Will repeat ABG in am

## 2014-05-26 NOTE — Progress Notes (Signed)
NUTRITION FOLLOW-UP  DOCUMENTATION CODES Per approved criteria  -Not Applicable   INTERVENTION: - Continue Vital AF 1.2 @ 65 ml/hr via OGT.  Tube feeding regimen with calories from Propofol provides 1993 kcal (103% of needs), 117 grams of protein, and 1264 ml of H2O.   NUTRITION DIAGNOSIS: Inadequate oral intake related to inability to eat as evidenced by NPO; ongoing  Goal: Pt to meet >/= 90% of their estimated nutrition needs; met with TF.   Monitor:  Weight trend, initiation and toleration of TF, labs/CBGs, vent status/settings  69 y.o. female  Admitting Dx: H1N1, pneumonia  ASSESSMENT: 69 y.o. F brought to [facility] ED 3/15 with 4 day hx of generalized body aches, SOB, subjective fevers, chills. In ED, had fever to 103, was placed on NRB for hypoxia and had RR in high 30's. PCCM called for admission due to concern for potential of deterioration.   3/16: - Pt intubated 3/16 - Spoke with husband who reports that pt as eating well until she started to get sick about 2 weeks ago. No significant weight loss. Pt's usual body weight is 165 lbs.  - No signs of fat or muscle wasting.   3/18: - Pt with H1N1 and Pneumonia. Running low grade fever.  - Per RN, pt with 30 mL residuals. Having diarrhea. C Diff pending.  - TF running at goal rate of 65 mL/hr.  - CBGs elevated- Lantus increased per MD note. Followed by Diabetes Coordinator.  - Pt fluid overloaded (+8.7 L since admission.) Wt increased 12 lb from 3/17 to 3/18. Re-weighed during RD visit to confirm. On Lasix. Per RN, urine output increasing.   3/21: -Pt discussed in rounds, tolerating TF well  Labs: CBGs elevated 245-313  Patient is currently intubated on ventilator support MV: 13.7 L/min Temp (24hrs), Avg:101.4 F (38.6 C), Min:100.8 F (38.2 C), Max:102.4 F (39.1 C)  Propofol: 4.6 ml/hr to provide additional 121 kcal  Labs reviewed  Height: Ht Readings from Last 1 Encounters:  05/24/14 _0  (1.626 m)    Weight: Wt Readings from Last 1 Encounters:  05/26/14 174 lb 9.7 oz (79.2 kg)   BMI:  Body mass index is 29.96 kg/(m^2).  Estimated Nutritional Needs: Kcal: 1928 Protein: 100-115 g Fluid: 2.0 L/day  Skin: intact  Diet Order: Diet NPO time specified  EDUCATION NEEDS: -Education not appropriate at this time   Intake/Output Summary (Last 24 hours) at 05/26/14 1049 Last data filed at 05/26/14 1000  Gross per 24 hour  Intake 2908.33 ml  Output   4635 ml  Net -1726.67 ml    Last BM: 3/21  Labs:   Recent Labs Lab 05/21/14 0345  05/25/14 0635 05/25/14 2010 05/26/14 0620  NA 136  < > 144 139 142  K 4.1  < > 2.9* 4.9 4.1  CL 104  < > 111 106 103  CO2 22  < > _1 BUN 12  < > 23 30* 33*  CREATININE 0.74  < > 0.75 0.84 0.82  CALCIUM 7.5*  < > 7.1* 8.3* 8.5  MG 1.3*  --   --  1.5  --   PHOS 2.1*  --   --  1.8*  --   GLUCOSE 194*  < > 232* 313* 245*  < > = values in this interval not displayed.  CBG (last 3)   Recent Labs  05/25/14 2349 05/26/14 0415 05/26/14 0752  GLUCAP 321* 309* 239*    Scheduled Meds: . antiseptic oral rinse  7 mL Mouth Rinse QID  . aspirin  81 mg Oral Daily  . cefTRIAXone (ROCEPHIN)  IV  1 g Intravenous Q24H  . chlorhexidine  15 mL Mouth Rinse BID  . enoxaparin (LOVENOX) injection  40 mg Subcutaneous Q24H  . furosemide  40 mg Intravenous 3 times per day  . insulin aspart  0-20 Units Subcutaneous 6 times per day  . insulin aspart  3 Units Subcutaneous 6 times per day  . insulin glargine  35 Units Subcutaneous Daily  . levothyroxine  150 mcg Per Tube QAC breakfast  . pantoprazole sodium  40 mg Per Tube Q24H    Continuous Infusions: . feeding supplement (VITAL AF 1.2 CAL) 1,000 mL (05/26/14 0601)  . fentaNYL infusion INTRAVENOUS 250 mcg/hr (05/26/14 0622)  . norepinephrine (LEVOPHED) Adult infusion Stopped (05/25/14 0825)  . propofol 10 mcg/kg/min (05/26/14 0730)    Past Medical History  Diagnosis Date  . Diabetes  mellitus   . Hypothyroidism   . Hypertension   . Hyperlipidemia     Past Surgical History  Procedure Laterality Date  . Back surgery      Elmer Picker MS Dietetic Intern Pager Number [phone]

## 2014-05-26 NOTE — Progress Notes (Signed)
PULMONARY / CRITICAL CARE MEDICINE   Name: Grace BoschLinda K Bloyd MRN: [id number] DOB: 09-02-1945    ADMISSION DATE:  05/20/2014 CONSULTATION DATE:  05/26/2014  REFERRING MD :  EDP  CHIEF COMPLAINT:  Body aches, SOB, chills, fever  INITIAL PRESENTATION:  69 yo female smoker with dyspnea, fever (Tm103F), chills, malaise from H1N1 influenza & pneumococcal PNA and sepsis.  STUDIES:   SIGNIFICANT EVENTS: 3/15 admit 3/17 paralytic for worsening pO2/FIO2  SUBJECTIVE:  Paralysis remains, rate remains 30, peep 8  VITAL SIGNS: Temp:  [100.8 F (38.2 C)-102.4 F (39.1 C)] 100.9 F (38.3 C) (03/21 1000) Pulse Rate:  [113-132] 122 (03/21 1000) Resp:  [28-33] 30 (03/21 1000) BP: (139-175)/(52-65) 139/52 mmHg (03/21 0315) SpO2:  [88 %-100 %] 99 % (03/21 1000) FiO2 (%):  [50 %] 50 % (03/21 0854) Weight:  [79.2 kg (174 lb 9.7 oz)] 79.2 kg (174 lb 9.7 oz) (03/21 0600) INTAKE / OUTPUT: Intake/Output      03/20 0701 - 03/21 0700 03/21 0701 - 03/22 0700   I.V. (mL/kg) 1409.3 (17.8) 194 (2.4)   Other 0    NG/GT 2055 260   IV Piggyback 250    Total Intake(mL/kg) 3714.3 (46.9) 454 (5.7)   Urine (mL/kg/hr) 3930 (2.1) 1250 (4)   Emesis/NG output 0 (0)    Total Output 3930 1250   Net -215.7 -796          PHYSICAL EXAMINATION: General: Arousable, chronically ill appearing Neuro: Arousable, following commands, moving all ext to command. HEENT: oral ETT, jvd present Cardiovascular:  s1 s2 regular, tachycardic Lungs: diffuse rhonchi unchanged Abdomen: soft, non tender, no r/g, no r Musculoskeletal: mild edema Skin: no rashes  LABS:  CBC  Recent Labs Lab 05/24/14 0440 05/25/14 0635 05/26/14 0620  WBC 11.1* 11.4* 14.6*  HGB 8.0* 7.4* 8.1*  HCT 25.9* 23.3* 25.5*  PLT 222 225 238   Coag's  Recent Labs Lab 05/20/14 1627  INR 1.15   BMET  Recent Labs Lab 05/25/14 0635 05/25/14 2010 05/26/14 0620  NA 144 139 142  K 2.9* 4.9 4.1  CL 111 106 103  CO2 23 27 29   BUN 23 30* 33*   CREATININE 0.75 0.84 0.82  GLUCOSE 232* 313* 245*   Electrolytes  Recent Labs Lab 05/21/14 0345  05/25/14 0635 05/25/14 2010 05/26/14 0620  CALCIUM 7.5*  < > 7.1* 8.3* 8.5  MG 1.3*  --   --  1.5  --   PHOS 2.1*  --   --  1.8*  --   < > = values in this interval not displayed. Sepsis Markers  Recent Labs Lab 05/20/14 1629 05/20/14 2140 05/22/14 0430  LATICACIDVEN 3.31* 2.5*  --   PROCALCITON  --  6.94 9.26   ABG  Recent Labs Lab 05/24/14 1857 05/25/14 0405 05/26/14 0320  PHART 7.345* 7.350 7.392  PCO2ART 45.4* 46.9* 50.0*  PO2ART 77.2* 76.2* 84.0   Liver Enzymes  Recent Labs Lab 05/20/14 1627 05/25/14 0635  AST 29 47*  ALT 11 32  ALKPHOS 79 148*  BILITOT 0.9 0.8  ALBUMIN 3.6 1.4*   Cardiac Enzymes  Recent Labs Lab 05/20/14 1627 05/20/14 2140  TROPONINI <0.03 0.03   Glucose  Recent Labs Lab 05/25/14 0753 05/25/14 1155 05/25/14 1541 05/25/14 2349 05/26/14 0415 05/26/14 0752  GLUCAP 236* 264* 278* 321* 309* 239*    Imaging Dg Chest Port 1 View  05/26/2014   CLINICAL DATA:  Pneumonia  EXAM: PORTABLE CHEST - 1 VIEW  COMPARISON:  05/25/2014  FINDINGS: Endotracheal tube tip is 4 cm above the carina. Nasogastric tube extends into the stomach. Left jugular central line extends into the SVC. Diffuse airspace opacities persist without significant interval change.  IMPRESSION: Support equipment appears satisfactorily positioned.  No significant interval change in the bilateral airspace opacities.   Electronically Signed   By: Ellery Plunk M.D.   On: 05/26/2014 05:19   Dg Chest Port 1 View  05/25/2014   CLINICAL DATA:  Followup influenza with bilateral lung infiltrates.  EXAM: PORTABLE CHEST - 1 VIEW  COMPARISON:  05/24/2014  FINDINGS: Bilateral airspace lung opacities have shown a minimal degree of improvement since the previous day's study. No new lung abnormalities. No pneumothorax.  Endotracheal tube, left internal jugular central venous line and  nasogastric tube are stable in well positioned.  IMPRESSION: Minimal improvement in bilateral lung opacities since the previous day's study. No other change.   Electronically Signed   By: Amie Portland M.D.   On: 05/25/2014 07:10   ASSESSMENT / PLAN:  PULMONARY A: ARDS 2nd to H1N1 PNA. Tobacco abuse P:   ARDS protocol, keeping plat less 30  Hold weaning for today ABG reviewed, keep same MV, decease to 40% and 5 cmH2O. PRN BD's dc NOT a home med Diureses  CARDIOVASCULAR A:  Sepsis 2nd to PNA. Hx HTN, HLD. P:  D/C levophed Cortisol adaquate Lasix as ordered  RENAL A:   AKI from sepsis >> improved. hypoK P:   Monitor renal fx Lasix 40 mg IV q6 x3 doses Replace electrolytes as indicated. BMET in am  [facility] IVF  GASTROINTESTINAL A:   Nutrition. P:   TFs to goal Stools were excessive now improved  HEMATOLOGIC A:   Leukocytosis. Dilutional anemia P:  F/u CBC Lovenox  INFECTIOUS A:   Pneumonia with pneumococcal Ag positive. P:   Rocephin 3/15>>> 3/15 zithromax>>> 3/19  tamiflu 3/15 >>3/19 double dose>>>  Pneumococcal Ag 3/15 >> positive Legionella Ag 3/15 >>neg Influenza PCR 3/15>> H1N1 pos c diff 3/18 >>neg Consider 10 days tamiflu Increased dose tamiflu 3/10, needs daily renal fxn Maintain recephin  ENDOCRINE A:   Hx of DM, hypothyroidism.r/o rel AI Uncontrolled glucose P:   Continue synthroid SSI CBG's q4hr Increase lantus to 35 TF coverage  NEUROLOGIC A:   Anxiety, insomnia, chronic pain. Severe ards, vent dyschrony P:   PRN tylenol Hold outpatient amitriptyline, norco. Propofol /fent gtt D/C propofol and add precedex to assist with weaning.  Family - updated bedside  The patient is critically ill with multiple organ systems failure and requires high complexity decision making for assessment and support, frequent evaluation and titration of therapies, application of advanced monitoring technologies and extensive interpretation of  multiple databases.   Critical Care Time devoted to patient care services described in this note is  35  Minutes. This time reflects time of care of this signee Dr Koren Bound. This critical care time does not reflect procedure time, or teaching time or supervisory time of PA/NP/Med student/Med Resident etc but could involve care discussion time.  Alyson Reedy, M.D. [facility] Pulmonary/Critical Care Medicine. Pager: [phone]. After hours pager: [phone].

## 2014-05-27 ENCOUNTER — Inpatient Hospital Stay (HOSPITAL_COMMUNITY): Payer: Commercial Managed Care - HMO

## 2014-05-27 LAB — CBC
HCT: 25 % — ABNORMAL LOW (ref 36.0–46.0)
Hemoglobin: 7.7 g/dL — ABNORMAL LOW (ref 12.0–15.0)
MCH: 25.6 pg — AB (ref 26.0–34.0)
MCHC: 30.8 g/dL (ref 30.0–36.0)
MCV: 83.1 fL (ref 78.0–100.0)
PLATELETS: 256 10*3/uL (ref 150–400)
RBC: 3.01 MIL/uL — AB (ref 3.87–5.11)
RDW: 14.2 % (ref 11.5–15.5)
WBC: 18.9 10*3/uL — AB (ref 4.0–10.5)

## 2014-05-27 LAB — BASIC METABOLIC PANEL
Anion gap: 10 (ref 5–15)
BUN: 42 mg/dL — ABNORMAL HIGH (ref 6–23)
CO2: 31 mmol/L (ref 19–32)
CREATININE: 0.77 mg/dL (ref 0.50–1.10)
Calcium: 8.4 mg/dL (ref 8.4–10.5)
Chloride: 105 mmol/L (ref 96–112)
GFR, EST NON AFRICAN AMERICAN: 86 mL/min — AB (ref >90)
Glucose, Bld: 259 mg/dL — ABNORMAL HIGH (ref 70–99)
POTASSIUM: 3.8 mmol/L (ref 3.5–5.1)
SODIUM: 146 mmol/L — AB (ref 135–145)

## 2014-05-27 LAB — BLOOD GAS, ARTERIAL
Acid-Base Excess: 8 mmol/L — ABNORMAL HIGH (ref 0.0–2.0)
Bicarbonate: 32.5 mEq/L — ABNORMAL HIGH (ref 20.0–24.0)
DRAWN BY: 235321
FIO2: 0.4 %
MECHVT: 360 mL
O2 Saturation: 92.4 %
PCO2 ART: 51.7 mmHg — AB (ref 35.0–45.0)
PEEP: 8 cmH2O
PH ART: 7.424 (ref 7.350–7.450)
Patient temperature: 101.8
RATE: 30 resp/min
TCO2: 30.7 mmol/L (ref 0–100)
pO2, Arterial: 66.6 mmHg — ABNORMAL LOW (ref 80.0–100.0)

## 2014-05-27 LAB — GLUCOSE, CAPILLARY
Glucose-Capillary: 232 mg/dL — ABNORMAL HIGH (ref 70–99)
Glucose-Capillary: 250 mg/dL — ABNORMAL HIGH (ref 70–99)
Glucose-Capillary: 306 mg/dL — ABNORMAL HIGH (ref 70–99)
Glucose-Capillary: 245 mg/dL — ABNORMAL HIGH (ref 70–99)
Glucose-Capillary: 212 mg/dL — ABNORMAL HIGH (ref 70–99)

## 2014-05-27 LAB — MAGNESIUM: MAGNESIUM: 1.5 mg/dL (ref 1.5–2.5)

## 2014-05-27 LAB — PHOSPHORUS: Phosphorus: 3.8 mg/dL (ref 2.3–4.6)

## 2014-05-27 MED ORDER — ETOMIDATE 2 MG/ML IV SOLN
40.0000 mg | Freq: Once | INTRAVENOUS | Status: DC
  Filled 2014-05-27: qty 20

## 2014-05-27 MED ORDER — FENTANYL CITRATE 0.05 MG/ML IJ SOLN: 200.0000 ug | Freq: Once | INTRAMUSCULAR | Status: AC

## 2014-05-27 MED ORDER — VECURONIUM BROMIDE 10 MG IV SOLR: 10.0000 mg | Freq: Once | INTRAVENOUS | Status: DC

## 2014-05-27 MED ORDER — FENTANYL CITRATE 0.05 MG/ML IJ SOLN: 200.0000 ug | Freq: Once | INTRAMUSCULAR | Status: DC

## 2014-05-27 MED ORDER — MIDAZOLAM HCL 2 MG/2ML IJ SOLN
4.0000 mg | Freq: Once | INTRAMUSCULAR | Status: DC
  Filled 2014-05-27: qty 4

## 2014-05-27 MED ORDER — ETOMIDATE 2 MG/ML IV SOLN
40.0000 mg | Freq: Once | INTRAVENOUS | Status: AC
  Administered 2014-05-28: 20 mg via INTRAVENOUS
  Filled 2014-05-27: qty 20

## 2014-05-27 MED ORDER — VANCOMYCIN HCL IN DEXTROSE 1-5 GM/200ML-% IV SOLN
1000.0000 mg | Freq: Two times a day (BID) | INTRAVENOUS | Status: DC
  Administered 2014-05-27 – 2014-05-29 (×5): 1000 mg via INTRAVENOUS
  Filled 2014-05-27 (×6): qty 200

## 2014-05-27 MED ORDER — PROPOFOL 10 MG/ML IV EMUL
5.0000 ug/kg/min | INTRAVENOUS | Status: DC
  Administered 2014-05-27: 5 ug/kg/min via INTRAVENOUS
  Administered 2014-05-27: 30 ug/kg/min via INTRAVENOUS
  Administered 2014-05-27 – 2014-05-28 (×5): 35 ug/kg/min via INTRAVENOUS
  Administered 2014-05-29: 25 ug/kg/min via INTRAVENOUS
  Administered 2014-05-29: 45 ug/kg/min via INTRAVENOUS
  Administered 2014-05-29: 30 ug/kg/min via INTRAVENOUS
  Administered 2014-05-29 – 2014-05-30 (×2): 25 ug/kg/min via INTRAVENOUS
  Filled 2014-05-27 (×12): qty 100

## 2014-05-27 MED ORDER — METOPROLOL TARTRATE 25 MG/10 ML ORAL SUSPENSION
12.5000 mg | Freq: Two times a day (BID) | ORAL | Status: DC
  Administered 2014-05-27 – 2014-05-31 (×7): 12.5 mg via ORAL
  Filled 2014-05-27 (×13): qty 5

## 2014-05-27 MED ORDER — MIDAZOLAM HCL 2 MG/2ML IJ SOLN: 4.0000 mg | Freq: Once | INTRAMUSCULAR | Status: DC

## 2014-05-27 MED ORDER — INSULIN GLARGINE 100 UNIT/ML ~~LOC~~ SOLN
38.0000 [IU] | Freq: Every day | SUBCUTANEOUS | Status: DC
  Administered 2014-05-27: 38 [IU] via SUBCUTANEOUS
  Filled 2014-05-27 (×2): qty 0.38

## 2014-05-27 NOTE — Progress Notes (Signed)
PULMONARY / CRITICAL CARE MEDICINE   Name: Grace Mitchell MRN: [id number] DOB: 04/28/1945    ADMISSION DATE:  05/20/2014 CONSULTATION DATE:  05/27/2014  REFERRING MD :  EDP  CHIEF COMPLAINT:  Body aches, SOB, chills, fever  INITIAL PRESENTATION:  69 yo female smoker with dyspnea, fever (Tm103F), chills, malaise from H1N1 influenza & pneumococcal PNA and sepsis.  STUDIES:   SIGNIFICANT EVENTS: 3/15 admit 3/17 paralytic for worsening pO2/FIO2  SUBJECTIVE:  Paralysis remains, rate remains 30, peep 8  VITAL SIGNS: Temp:  [100.9 F (38.3 C)-102.9 F (39.4 C)] 102.9 F (39.4 C) (03/22 0645) Pulse Rate:  [98-131] 103 (03/22 0645) Resp:  [18-35] 27 (03/22 0645) BP: (193)/(62) 193/62 mmHg (03/22 0330) SpO2:  [92 %-100 %] 99 % (03/22 0645) FiO2 (%):  [40 %-50 %] 40 % (03/22 0810) Weight:  [174 lb 9.7 oz (79.2 kg)] 174 lb 9.7 oz (79.2 kg) (03/22 0336) INTAKE / OUTPUT: Intake/Output      03/21 0701 - 03/22 0700 03/22 0701 - 03/23 0700   I.V. (mL/kg) 12444.5 (157.1)    Other     NG/GT 1560    IV Piggyback 50    Total Intake(mL/kg) 14054.5 (177.5)    Urine (mL/kg/hr) 3900 (2.1)    Emesis/NG output     Total Output 3900     Net +10154.5            PHYSICAL EXAMINATION: General: Arousable, chronically ill appearing, HTN,tachycardic  Neuro: Arousable, following commands, moving all ext weakly. HEENT: oral ETT, jvd present Cardiovascular:  s1 s2 regular, tachycardic Lungs: diffuse rhonchi unchanged Abdomen: soft, non tender, no r/g, no r Musculoskeletal: mild edema Skin: no rashes  LABS:  CBC  Recent Labs Lab 05/25/14 0635 05/26/14 0620 05/27/14 0405  WBC 11.4* 14.6* 18.9*  HGB 7.4* 8.1* 7.7*  HCT 23.3* 25.5* 25.0*  PLT 225 238 256   Coag's  Recent Labs Lab 05/20/14 1627  INR 1.15   BMET  Recent Labs Lab 05/25/14 2010 05/26/14 0620 05/27/14 0405  NA 139 142 146*  K 4.9 4.1 3.8  CL 106 103 105  CO2 BUN 30* 33* 42*  CREATININE 0.84 0.82  0.77  GLUCOSE 313* 245* 259*   Electrolytes  Recent Labs Lab 05/21/14 0345  05/25/14 2010 05/26/14 0620 05/27/14 0405  CALCIUM 7.5*  < > 8.3* 8.5 8.4  MG 1.3*  --  1.5  --  1.5  PHOS 2.1*  --  1.8*  --  3.8  < > = values in this interval not displayed. Sepsis Markers  Recent Labs Lab 05/20/14 1629 05/20/14 2140 05/22/14 0430  LATICACIDVEN 3.31* 2.5*  --   PROCALCITON  --  6.94 9.26   ABG  Recent Labs Lab 05/26/14 1412 05/26/14 1620 05/27/14 0410  PHART 7.455* 7.451* 7.424  PCO2ART 46.4* 49.6* 51.7*  PO2ART 105.0* 96.1 66.6*   Liver Enzymes  Recent Labs Lab 05/20/14 1627 05/25/14 0635  AST 29 47*  ALT 11 32  ALKPHOS 79 148*  BILITOT 0.9 0.8  ALBUMIN 3.6 1.4*   Cardiac Enzymes  Recent Labs Lab 05/20/14 1627 05/20/14 2140  TROPONINI <0.03 0.03   Glucose  Recent Labs Lab 05/26/14 1233 05/26/14 1720 05/26/14 1953 05/26/14 2342 05/27/14 0335 05/27/14 0742  GLUCAP 271* 185* 249* 230* 232* 250*    Imaging Dg Chest Port 1 View  05/27/2014   CLINICAL DATA:  Respiratory failure.  Endotracheal tube.  EXAM: PORTABLE CHEST - 1 VIEW  COMPARISON:  05/26/2014  FINDINGS: Endotracheal tube is 3.8 cm above the carina. Nasogastric tube extends into the stomach. Left jugular central line extends into the SVC. Diffuse airspace opacities persist bilaterally and may be slightly improved compared to the previous day.  IMPRESSION: Support equipment appears satisfactorily positioned.  Persistent diffuse airspace opacities with slight improvement.   Electronically Signed   By: Ellery Plunkaniel R Mitchell M.D.   On: 05/27/2014 06:49   Dg Chest Port 1 View  05/26/2014   CLINICAL DATA:  Pneumonia  EXAM: PORTABLE CHEST - 1 VIEW  COMPARISON:  05/25/2014  FINDINGS: Endotracheal tube tip is 4 cm above the carina. Nasogastric tube extends into the stomach. Left jugular central line extends into the SVC. Diffuse airspace opacities persist without significant interval change.  IMPRESSION:  Support equipment appears satisfactorily positioned.  No significant interval change in the bilateral airspace opacities.   Electronically Signed   By: Ellery Plunkaniel R Mitchell M.D.   On: 05/26/2014 05:19   ASSESSMENT / PLAN:  PULMONARY A: ARDS 2nd to H1N1 PNA. Tobacco abuse P:   ARDS protocol, keeping plat less 30  Hold weaning for today ABG reviewed, keep same MV, decease to 40% and 5 cmH2O. PRN BD's dc NOT a home med Diureses Plan for trach 3/23 due to CIP/Severe ARDS and suspected prolong rehab course  CARDIOVASCULAR A:  Sepsis 2nd to PNA. Hx HTN, HLD. P:  D/C levophed Cortisol adaquate Lasix as ordered  RENAL Lab Results  Component Value Date   CREATININE 0.77 05/27/2014   CREATININE 0.82 05/26/2014   CREATININE 0.84 05/25/2014    Intake/Output Summary (Last 24 hours) at 05/27/14 0934 Last data filed at 05/27/14 0600  Gross per 24 hour  Intake 13829.71 ml  Output   3900 ml  Net 9929.71 ml    A:   AKI from sepsis >> improved. hypoK P:   Monitor renal fx Lasix 40 mg IV q6 x3 doses , started 3/20 Replace electrolytes as indicated. [facility]  [facility]  GASTROINTESTINAL A:   Nutrition. P:   TFs to goal Stools were excessive now improved  HEMATOLOGIC A:   Leukocytosis. Dilutional anemia P:  F/u CBC Lovenox  INFECTIOUS A:   Pneumonia with pneumococcal Ag positive. 3/22 fever 102 re culture and start vanc  P:   Rocephin 3/15>>> Vanco 3/22>> 3/15 zithromax>>> 3/19  tamiflu 3/15 >>3/20  Pneumococcal Ag 3/15 >> positive Legionella Ag 3/15 >>neg Influenza PCR 3/15>> H1N1 pos c diff 3/18 >>neg  3/22 bc x 2>> 3/22 uc>> 3/22 sputum>>  ENDOCRINE CBG (last 3)   Recent Labs  05/26/14 2342 05/27/14 0335 05/27/14 0742  GLUCAP 230* 232* 250*     A:   Hx of DM, hypothyroidism.r/o rel AI Uncontrolled glucose P:   Continue synthroid SSI CBG's q4hr Increase lantus to 38 TF coverage  NEUROLOGIC A:   Anxiety, insomnia, chronic  pain. Severe ards, vent dyschrony SUSPECTED cip P:   PRN tylenol Hold outpatient amitriptyline, norco. Propofol Armida Sans/fent gtt 3/22 diprivan restarted due to htn and anxiety PT when better  Family - updated bedside  Discussion: Family agreeable to early tracheostomy. Fever of 102 may be hyperdynamic but will pan culture and start vancomycin.   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  I am concerned that without a tracheostomy that patient will not progress.  Spoke with husband bedside at length.  He is agreeable to tracheostomy and will likely need PEG after that then placement for rehab.  The patient is critically ill with multiple organ systems failure and requires high complexity decision making for assessment and support, frequent evaluation and titration of therapies, application of advanced monitoring technologies and extensive interpretation of multiple databases.   Critical Care Time devoted to patient care services described in this note is  35  Minutes. This time reflects time of care of this signee Dr Koren Bound. This critical care time does not reflect procedure time, or teaching time or supervisory time of PA/NP/Med student/Med Resident etc but could involve care discussion time.  Alyson Reedy, M.D. [facility] Pulmonary/Critical Care Medicine. Pager: [phone]. After hours pager: [phone].  05/27/2014, 9:26 AM

## 2014-05-27 NOTE — Progress Notes (Signed)
ANTIBIOTIC CONSULT NOTE - INITIAL  Pharmacy Consult for Vancomycin  Indication: Worsening pneumonia  No Known Allergies  Patient Measurements: Height:  (167.6 cm) Weight: 174 lb 9.7 oz (79.2 kg) IBW/kg (Calculated) : 59.3 Adjusted Body Weight: n/a  Vital Signs: Temp: 102 F (38.9 C) (03/22 0900) Temp Source: Core (Comment) (03/22 0815) BP: 193/62 mmHg (03/22 0330) Pulse Rate: 152 (03/22 0900) Intake/Output from previous day: 03/21 0701 - 03/22 0700 In: 14054.5 [I.V.:12444.5; NG/GT:1560; IV Piggyback:50] Out: 3900 [Urine:3900] Intake/Output from this shift:    Labs:  Recent Labs  05/25/14 0635 05/25/14 2010 05/26/14 0620 05/27/14 0405  WBC 11.4*  --  14.6* 18.9*  HGB 7.4*  --  8.1* 7.7*  PLT 225  --  238 256  CREATININE 0.75 0.84 0.82 0.77   Estimated Creatinine Clearance: 73.5 mL/min (by C-G formula based on Cr of 0.77). No results for input(s): VANCOTROUGH, VANCOPEAK, VANCORANDOM, GENTTROUGH, GENTPEAK, GENTRANDOM, TOBRATROUGH, TOBRAPEAK, TOBRARND, AMIKACINPEAK, AMIKACINTROU, AMIKACIN in the last 72 hours.   Microbiology: Recent Results (from the past 720 hour(s))  Urine culture     Status: None   Collection Time: 05/20/14  3:49 PM  Result Value Ref Range Status   Specimen Description URINE, CLEAN CATCH  Final   Special Requests NONE  Final   Colony Count   Final    30,000 COLONIES/ML Performed at [facility]    Culture   Final    Multiple bacterial morphotypes present, none predominant. Suggest appropriate recollection if clinically indicated. Performed at [facility]    Report Status 05/21/2014 FINAL  Final  MRSA PCR Screening     Status: None   Collection Time: 05/20/14  4:04 PM  Result Value Ref Range Status   MRSA by PCR NEGATIVE NEGATIVE Final    Comment:        The GeneXpert MRSA Assay (FDA approved for NASAL specimens only), is one component of a comprehensive MRSA colonization surveillance program. It is not intended  to diagnose MRSA infection nor to guide or monitor treatment for MRSA infections.   Blood Culture (routine x 2)     Status: None   Collection Time: 05/20/14  4:26 PM  Result Value Ref Range Status   Specimen Description BLOOD LEFT ANTECUBITAL  Final   Special Requests BOTTLES DRAWN AEROBIC AND ANAEROBIC  Final   Culture   Final    NO GROWTH 5 DAYS Performed at [facility]    Report Status 05/26/2014 FINAL  Final  Blood Culture (routine x 2)     Status: None   Collection Time: 05/20/14  4:34 PM  Result Value Ref Range Status   Specimen Description BLOOD LEFT HAND  Final   Special Requests BOTTLES DRAWN AEROBIC AND ANAEROBIC 4CC  Final   Culture   Final    NO GROWTH 5 DAYS Performed at [facility]    Report Status 05/26/2014 FINAL  Final  Culture, respiratory (NON-Expectorated)     Status: None   Collection Time: 05/21/14 12:32 PM  Result Value Ref Range Status   Specimen Description TRACHEAL ASPIRATE  Final   Special Requests Normal  Final   Gram Stain   Final    FEW WBC PRESENT,BOTH PMN AND MONONUCLEAR RARE SQUAMOUS EPITHELIAL CELLS PRESENT NO ORGANISMS SEEN Performed at [facility]    Culture   Final    NO GROWTH 2 DAYS Performed at [facility]    Report Status 05/23/2014 FINAL  Final  Clostridium  Difficile by PCR     Status: None   Collection Time: 05/23/14  8:56 AM  Result Value Ref Range Status   C difficile by pcr NEGATIVE NEGATIVE Final    Medical History: Past Medical History  Diagnosis Date  . Diabetes mellitus   . Hypothyroidism   . Hypertension   . Hyperlipidemia     Assessment 69 yo female smoker with dyspnea, fever (Tm103F), chills, malaise from H1N1 influenza & pneumococcal PNA and sepsis. PMH DM  3/15 >> Vanc >> 3/15; restart 3/22 >> 3/15 >> Zosyn>> 3/15 3/15 >> Ceftriaxone >> 3/15 >> Azithromycin >> 3/19 3/15 >> Tamiflu >> 3/20  Tmax: persistent fevers, to 102.9 overnight WBCs: elevated and  rising Renal: Scr improved to WNL, CrCl 74 ml/min PCT 6.94 > 9.26 (3/16) LA 3.31 > 2.5 (3/15)  3/15 blood x 2: NGF 3/16 urine: 30K cfu/ml, no predominant species 3/16 influenza panel: + influenza A, H1N1 3/15 strep pneumo ur ag: POSITIVE 3/15 legionella ur ag: neg 3/16 trach asp cx: ng - final 3/18 cdiff negative 3/22 repeat blood: IP 3/22 repeat sput: IP  Drug level / dose changes info:  3/22: D8 Abx - restarted vancomycin 1000 mg q12h plus CTX; completed high-dose Tamiflu (feinstein dosing) for sepsis 2/2 pneumococcal pna/CAP/flu in pt with ARDS.   Goal of Therapy:   Vancomycin trough level 15-20 mcg/ml   Eradication of infection  Appropriate antibiotic dosing for indication and renal function  Plan:   Begin vancomycin 1000 mg IV q12hr  Continue ceftriaxone 1g IV q24.  Measure vancomycin trough levels at steady state as indicated  Follow clinical course, culture results as available, renal function  Follow for de-escalation of antibiotics and LOT   Bernadene Personrew Iliany Losier, PharmD Pager: [phone] 05/27/2014, 9:58 AM

## 2014-05-27 NOTE — Progress Notes (Addendum)
Inpatient Diabetes Program Recommendations  AACE/ADA: New Consensus Statement on Inpatient Glycemic Control (2013)  Target Ranges:  Prepandial:   less than 140 mg/dL      Peak postprandial:   less than 180 mg/dL (1-2 hours)      Critically ill patients:  140 - 180 mg/dL    Results for Grace Mitchell, Grace Mitchell (MRN [id number]) as of 05/27/2014 07:53  Ref. Range 05/25/2014 23:49 05/26/2014 04:15 05/26/2014 07:52 05/26/2014 12:33 05/26/2014 17:20 05/26/2014 19:53  Glucose-Capillary Latest Range: 70-99 mg/dL 119321 (H) 147309 (H) 829239 (H) 271 (H) 185 (H) 249 (H)    Results for Grace Mitchell, Grace Mitchell (MRN [id number]) as of 05/27/2014 07:53  Ref. Range 05/26/2014 23:42 05/27/2014 03:35  Glucose-Capillary Latest Range: 70-99 mg/dL 784230 (H) 696232 (H)     Outpatient Diabetes medications: Lantus 40 units q AM and 60 units q HS            Janumet 50-500 mg one daily  Current orders for Inpatient glycemic control: Lantus 35 units daily        Novolog 3 units q 4 hours to cover tube feeds           Novolog Resistant SSI q 4 hours.    MD- Patient required 69 units Novolog yesterday (combination of SSI and tube feed coverage) and CBGs still remain uncontrolled  Patient takes much larger doses of Lantus at home  Please consider the following insulin adjustments:   1. Increase Lantus to 25 units bid (this would be 50% of the total amount of Lantus patient takes at home)  2. Increase Novolog tube feed coverage to 5 units Q4 hours     Will follow Ambrose FinlandJeannine Johnston Kamau Weatherall RN, MSN, CDE Diabetes Coordinator Inpatient Diabetes Program Team Pager: [phone] (8a-5p)

## 2014-05-27 NOTE — Progress Notes (Signed)
Two rt's switch out a-line on pt. Pt had no adverse reaction to therapy. New dressing applied a-line still working properly.

## 2014-05-28 ENCOUNTER — Encounter (HOSPITAL_COMMUNITY): Payer: Medicare Other

## 2014-05-28 ENCOUNTER — Inpatient Hospital Stay (HOSPITAL_COMMUNITY): Payer: Commercial Managed Care - HMO

## 2014-05-28 DIAGNOSIS — Z4659 Encounter for fitting and adjustment of other gastrointestinal appliance and device: Secondary | ICD-10-CM | POA: Insufficient documentation

## 2014-05-28 LAB — GLUCOSE, CAPILLARY
GLUCOSE-CAPILLARY: 191 mg/dL — AB (ref 70–99)
GLUCOSE-CAPILLARY: 230 mg/dL — AB (ref 70–99)
GLUCOSE-CAPILLARY: 272 mg/dL — AB (ref 70–99)
Glucose-Capillary: 136 mg/dL — ABNORMAL HIGH (ref 70–99)
Glucose-Capillary: 92 mg/dL (ref 70–99)
Glucose-Capillary: 46 mg/dL — ABNORMAL LOW (ref 70–99)
Glucose-Capillary: 97 mg/dL (ref 70–99)

## 2014-05-28 LAB — PROTIME-INR
INR: 1.03 (ref 0.00–1.49)
Prothrombin Time: 13.6 seconds (ref 11.6–15.2)

## 2014-05-28 LAB — BASIC METABOLIC PANEL
Anion gap: 11 (ref 5–15)
Anion gap: 10 (ref 5–15)
BUN: 46 mg/dL — AB (ref 6–23)
BUN: 43 mg/dL — AB (ref 6–23)
CHLORIDE: 101 mmol/L (ref 96–112)
CHLORIDE: 106 mmol/L (ref 96–112)
CO2: 35 mmol/L — ABNORMAL HIGH (ref 19–32)
CO2: 33 mmol/L — ABNORMAL HIGH (ref 19–32)
Calcium: 8.4 mg/dL (ref 8.4–10.5)
Calcium: 8 mg/dL — ABNORMAL LOW (ref 8.4–10.5)
Creatinine, Ser: 0.79 mg/dL (ref 0.50–1.10)
Creatinine, Ser: 0.73 mg/dL (ref 0.50–1.10)
GFR calc Af Amer: >90 mL/min (ref >90)
GFR calc Af Amer: >90 mL/min (ref >90)
GFR calc non Af Amer: 87 mL/min — ABNORMAL LOW (ref >90)
GFR, EST NON AFRICAN AMERICAN: 85 mL/min — AB (ref >90)
GLUCOSE: 237 mg/dL — AB (ref 70–99)
Glucose, Bld: 114 mg/dL — ABNORMAL HIGH (ref 70–99)
Potassium: 3.5 mmol/L (ref 3.5–5.1)
Potassium: 3.6 mmol/L (ref 3.5–5.1)
Sodium: 147 mmol/L — ABNORMAL HIGH (ref 135–145)
Sodium: 149 mmol/L — ABNORMAL HIGH (ref 135–145)

## 2014-05-28 LAB — CBC
HCT: 23.7 % — ABNORMAL LOW (ref 36.0–46.0)
Hemoglobin: 7.3 g/dL — ABNORMAL LOW (ref 12.0–15.0)
MCH: 26.5 pg (ref 26.0–34.0)
MCHC: 30.8 g/dL (ref 30.0–36.0)
MCV: 86.2 fL (ref 78.0–100.0)
PLATELETS: 223 10*3/uL (ref 150–400)
RBC: 2.75 MIL/uL — ABNORMAL LOW (ref 3.87–5.11)
RDW: 14.6 % (ref 11.5–15.5)
WBC: 22.1 10*3/uL — ABNORMAL HIGH (ref 4.0–10.5)

## 2014-05-28 LAB — APTT: aPTT: 27 seconds (ref 24–37)

## 2014-05-28 LAB — MAGNESIUM: Magnesium: 1.6 mg/dL (ref 1.5–2.5)

## 2014-05-28 LAB — URINE CULTURE
Colony Count: NO GROWTH
Culture: NO GROWTH
SPECIAL REQUESTS: NORMAL

## 2014-05-28 LAB — PHOSPHORUS: PHOSPHORUS: 3.4 mg/dL (ref 2.3–4.6)

## 2014-05-28 MED ORDER — MAGNESIUM SULFATE 2 GM/50ML IV SOLN
2.0000 g | Freq: Once | INTRAVENOUS | Status: AC
  Administered 2014-05-28: 2 g via INTRAVENOUS
  Filled 2014-05-28: qty 50

## 2014-05-28 MED ORDER — POTASSIUM CHLORIDE 20 MEQ/15ML (10%) PO SOLN
20.0000 meq | ORAL | Status: AC
  Administered 2014-05-28: 20 meq
  Filled 2014-05-28: qty 15

## 2014-05-28 MED ORDER — LIDOCAINE HCL (CARDIAC) 20 MG/ML IV SOLN
INTRAVENOUS | Status: AC
  Filled 2014-05-28: qty 5

## 2014-05-28 MED ORDER — FUROSEMIDE 10 MG/ML IJ SOLN
40.0000 mg | Freq: Every day | INTRAMUSCULAR | Status: DC
  Administered 2014-05-29: 40 mg via INTRAVENOUS
  Filled 2014-05-28 (×2): qty 4

## 2014-05-28 MED ORDER — DEXTROSE 50 % IV SOLN
INTRAVENOUS | Status: AC
  Administered 2014-05-28: 25 mL
  Filled 2014-05-28: qty 50

## 2014-05-28 MED ORDER — SUCCINYLCHOLINE CHLORIDE 20 MG/ML IJ SOLN
INTRAMUSCULAR | Status: AC
  Filled 2014-05-28: qty 1

## 2014-05-28 MED ORDER — INSULIN GLARGINE 100 UNIT/ML ~~LOC~~ SOLN
40.0000 [IU] | Freq: Every day | SUBCUTANEOUS | Status: DC
Start: 2014-05-28 — End: 2014-05-29
  Administered 2014-05-28 – 2014-05-29 (×2): 40 [IU] via SUBCUTANEOUS
  Filled 2014-05-28 (×2): qty 0.4

## 2014-05-28 MED ORDER — ROCURONIUM BROMIDE 50 MG/5ML IV SOLN
INTRAVENOUS | Status: AC
  Administered 2014-05-28: 50 mg
  Filled 2014-05-28: qty 2

## 2014-05-28 MED ORDER — ETOMIDATE 2 MG/ML IV SOLN
INTRAVENOUS | Status: AC
  Filled 2014-05-28: qty 20

## 2014-05-28 NOTE — Progress Notes (Signed)
[facility] FOR [facility] LAB REPLACEMENT ONLY  The patient does [facility] based on the criteria listed below:   1. Is GFR >/= 40 ml/min? Yes.    Patient's GFR today is >90 2. Is urine output >/= 0.5 ml/kg/hr for the last 6 hours? Yes.   Patient's UOP is .89 ml/kg/hr 3. Is BUN < 60 mg/dL? Yes.    Patient's BUN today is 46 4. Abnormal electrolyte 3.5 5. Ordered repletion with:per protocol 6. If a panic level lab has been reported, has the CCM MD in charge been notified? Yes.  .   Physician:  [place]  Grace Mitchell, Grace Mitchell 05/28/2014 4:54 AM

## 2014-05-28 NOTE — Progress Notes (Signed)
Inpatient Diabetes Program Recommendations  AACE/ADA: New Consensus Statement on Inpatient Glycemic Control (2013)  Target Ranges:  Prepandial:   less than 140 mg/dL      Peak postprandial:   less than 180 mg/dL (1-2 hours)      Critically ill patients:  140 - 180 mg/dL    Results for Grace Mitchell, Grace Mitchell (MRN [id number]) as of 05/28/2014 08:56  Ref. Range 05/26/2014 23:42 05/27/2014 03:35 05/27/2014 07:42 05/27/2014 11:47 05/27/2014 15:46 05/27/2014 20:01  Glucose-Capillary Latest Range: 70-99 mg/dL 409230 (H) 811232 (H) 914250 (H) 306 (H) 245 (H) 212 (H)    Results for Grace Mitchell, Grace Mitchell (MRN [id number]) as of 05/28/2014 08:56  Ref. Range 05/28/2014 00:25 05/28/2014 03:31 05/28/2014 07:35  Glucose-Capillary Latest Range: 70-99 mg/dL 086272 (H) 578230 (H) 469191 (H)      Outpatient Diabetes medications: Lantus 40 units q AM and 60 units q HS  Janumet 50-500 mg one daily  Current orders for Inpatient glycemic control: Lantus 40 units daily  Novolog 3 units q 4 hours to cover tube feeds   Novolog Resistant SSI q 4 hours.    MD- Patient required 68 units Novolog yesterday (combination of SSI and tube feed coverage) and CBGs still remain uncontrolled  Patient takes much larger doses of Lantus at home  Please consider the following insulin adjustments:   1. Increase Lantus to 25 units bid (this would be 50% of the total amount of Lantus patient takes at home)  2. Increase Novolog tube feed coverage to 5 units Q4 hours      Will follow Ambrose FinlandJeannine Johnston Teige Rountree RN, MSN, CDE Diabetes Coordinator Inpatient Diabetes Program Team Pager: [phone] (8a-5p)

## 2014-05-28 NOTE — Procedures (Signed)
Bronchoscopy  for Percutaneous  Tracheostomy  Name: Grace BoschLinda K Mitchell MRN: [id number] DOB: September 11, 1945 Procedure: Bronchoscopy for Percutaneous Tracheostomy Indications: Diagnostic evaluation of the airways In conjunction with: Dr. Molli KnockYacoub  Procedure Details Consent: Risks of procedure as well as the alternatives and risks of each were explained to the (patient/caregiver).  Consent for procedure obtained. Time Out: Verified patient identification, verified procedure, site/side was marked, verified correct patient position, special equipment/implants available, medications/allergies/relevent history reviewed, required imaging and test results available.  Performed  In preparation for procedure, patient was given 100% FiO2 and bronchoscope lubricated. Sedation: Benzodiazepines, Muscle relaxants and Etomidate  Airway entered and the following bronchi were examined: RML.   Procedures performed: Endotracheal Tube retracted in 2 cm increments. Cannulation of airway observed. Dilation observed. Placement of trachel tube  observed . No overt complications. Bronchoscope removed.    Evaluation Hemodynamic Status: BP stable throughout; O2 sats: stable throughout Patient's Current Condition: stable Specimens:  None Complications: No apparent complications Patient did tolerate procedure well.   Brett CanalesSteve Minor ACNP [facility] Pager [phone] till 3 pm If no answer page [phone]  I placed bronchoscope in ETT, I backed the ETT out to good position and I supervised the rest of the procedure while placing tracheostomy with my NP holding and only holding the bronchoscope.  Alyson ReedyWesam G. Kalum Minner, M.D. [facility]. Pager: [phone]. After hours pager: [phone].   05/28/2014, 2:31 PM

## 2014-05-28 NOTE — Procedures (Signed)
Percutaneous Tracheostomy Placement  Consent from family.  Patient sedated, paralyzed and position.  Placed on 100% FiO2 and RR matched.  Area cleaned and draped.  Lidocaine/epi injected.  Skin incision done followed by blunt dissection.  Trachea palpated then punctured, catheter passed and visualized bronchoscopically.  Wire placed and visualized.  Catheter removed.  Airway then crushed and dilated.  Size 6 cuffed shiley trach placed and visualized bronchoscopically well above carina.  Good volume returns.  Patient tolerated the procedure well without complications.  Minimal blood loss.  CXR ordered and pending.  Grace Mitchell, M.D. [facility]/Critical [facility]. Pager: [phone]. After hours pager: [phone]. 

## 2014-05-28 NOTE — Procedures (Signed)
Bedside Tracheostomy Insertion Procedure Note   Patient Details:   Name: Jackquline BoschLinda K Weiland DOB: 03-Dec-1945 MRN: [id number]  Procedure: Tracheostomy  Pre Procedure Assessment: ET Tube Size: 7.5 ET Tube secured at lip (cm): 23 Bite block in place: No Breath Sounds: Clear  Post Procedure Assessment: BP 143/55 mmHg  Pulse 103  Temp(Src) 99.1 F (37.3 C) (Core (Comment))  Resp 30  Ht 5\' 6"  (1.676 m)  Wt 175 lb 7.8 oz (79.6 kg)  BMI 28.34 kg/m2  SpO2 97% O2 sats: stable throughout Complications: No apparent complications Patient did tolerate procedure well Tracheostomy Brand:Shiley Tracheostomy Style:Cuffed Tracheostomy Size: 6 Tracheostomy Secured WUJ:[id number]:Sutures, velcro Tracheostomy Placement Confirmation:Trach cuff visualized and in place and Chest X ray ordered for placement    Jacqulynn CadetHopper, Wilhemenia Camba David 05/28/2014, 11:14 AM

## 2014-05-28 NOTE — Progress Notes (Signed)
PULMONARY / CRITICAL CARE MEDICINE   Name: Grace Mitchell MRN: [id number] DOB: 10-27-1945    ADMISSION DATE:  05/20/2014 CONSULTATION DATE:  05/28/2014  REFERRING MD :  EDP  CHIEF COMPLAINT:  Body aches, SOB, chills, fever  INITIAL PRESENTATION:  69 yo female smoker with dyspnea, fever (Tm103F), chills, malaise from H1N1 influenza & pneumococcal PNA and sepsis.  STUDIES:   SIGNIFICANT EVENTS: 3/15 admit 3/17 paralytic for worsening pO2/FIO2  SUBJECTIVE:  Paralysis remains, rate remains 30, peep 8  VITAL SIGNS: Temp:  [99.1 F (37.3 C)-102 F (38.9 C)] 99.1 F (37.3 C) (03/23 0700) Pulse Rate:  [101-152] 115 (03/23 0700) Resp:  [15-44] 28 (03/23 0700) BP: (143-166)/(55) 143/55 mmHg (03/22 2156) SpO2:  [88 %-100 %] 91 % (03/23 0700) FiO2 (%):  [30 %-40 %] 30 % (03/23 0833) Weight:  [175 lb 7.8 oz (79.6 kg)] 175 lb 7.8 oz (79.6 kg) (03/23 0400) INTAKE / OUTPUT: Intake/Output      03/22 0701 - 03/23 0700 03/23 0701 - 03/24 0700   I.V. (mL/kg) 1169 (14.7)    NG/GT 1553.9    IV Piggyback 500    Total Intake(mL/kg) 3222.9 (40.5)    Urine (mL/kg/hr) 3875 (2)    Total Output 3875     Net -652.1            PHYSICAL EXAMINATION: General: Arousable, chronically ill appearing, sedated Neuro: Arousable, following commands, moving all ext weakly. HEENT: oral ETT, jvd present Cardiovascular:  s1 s2 regular, tachycardic Lungs: diffuse rhonchi unchanged Abdomen: soft, non tender, no r/g, no r Musculoskeletal: mild edema Skin: no rashes  LABS:  CBC  Recent Labs Lab 05/26/14 0620 05/27/14 0405 05/28/14 0400  WBC 14.6* 18.9* 22.1*  HGB 8.1* 7.7* 7.3*  HCT 25.5* 25.0* 23.7*  PLT 238 256 223   Coag's  Recent Labs Lab 05/28/14 0400  APTT 27  INR 1.03   BMET  Recent Labs Lab 05/26/14 0620 05/27/14 0405 05/28/14 0400  NA 142 146* 147*  K 4.1 3.8 3.5  CL 103 105 101  CO2 29 31 35*  BUN 33* 42* 46*  CREATININE 0.82 0.77 0.79  GLUCOSE 245* 259* 237*    Electrolytes  Recent Labs Lab 05/25/14 2010 05/26/14 0620 05/27/14 0405 05/28/14 0400  CALCIUM 8.3* 8.5 8.4 8.4  MG 1.5  --  1.5 1.6  PHOS 1.8*  --  3.8 3.4   Sepsis Markers  Recent Labs Lab 05/22/14 0430  PROCALCITON 9.26   ABG  Recent Labs Lab 05/26/14 1412 05/26/14 1620 05/27/14 0410  PHART 7.455* 7.451* 7.424  PCO2ART 46.4* 49.6* 51.7*  PO2ART 105.0* 96.1 66.6*   Liver Enzymes  Recent Labs Lab 05/25/14 0635  AST 47*  ALT 32  ALKPHOS 148*  BILITOT 0.8  ALBUMIN 1.4*   Cardiac Enzymes No results for input(s): TROPONINI, PROBNP in the last 168 hours. Glucose  Recent Labs Lab 05/27/14 1147 05/27/14 1546 05/27/14 2001 05/28/14 0025 05/28/14 0331 05/28/14 0735  GLUCAP 306* 245* 212* 272* 230* 191*    Imaging Dg Chest Port 1 View  05/28/2014   CLINICAL DATA:  Respiratory failure .  EXAM: PORTABLE CHEST - 1 VIEW  COMPARISON:  05/27/2014 .  FINDINGS: Endotracheal tube, NG tube, left IJ line stable position. Heart size stable. Diffuse pulmonary infiltrates are present and unchanged. Tiny left pleural effusion cannot be excluded. No pneumothorax. No acute osseous abnormality.  IMPRESSION: 1. Lines and tubes in stable position. 2. Diffuse bilateral pulmonary infiltrates, unchanged. Small left pleural  effusion cannot be excluded.   Electronically Signed   By: Maisie Fus  Register   On: 05/28/2014 07:16   Dg Chest Port 1 View  05/27/2014   CLINICAL DATA:  Respiratory failure.  Endotracheal tube.  EXAM: PORTABLE CHEST - 1 VIEW  COMPARISON:  05/26/2014  FINDINGS: Endotracheal tube is 3.8 cm above the carina. Nasogastric tube extends into the stomach. Left jugular central line extends into the SVC. Diffuse airspace opacities persist bilaterally and may be slightly improved compared to the previous day.  IMPRESSION: Support equipment appears satisfactorily positioned.  Persistent diffuse airspace opacities with slight improvement.   Electronically Signed   By: Ellery Plunk M.D.   On: 05/27/2014 06:49   ASSESSMENT / PLAN:  PULMONARY A: ARDS 2nd to H1N1 PNA. Tobacco abuse P:   ARDS protocol, keeping plat less 30  Hold weaning for today ABG reviewed, keep same MV, decease to 40% and 5 cmH2O. PRN BD's dc NOT a home med Diureses  trach 3/23 due to CIP/Severe ARDS and suspected prolong rehab course  CARDIOVASCULAR A:  Sepsis 2nd to PNA. Hx HTN, HLD. P:  D/C levophed Cortisol adaquate Lasix as ordered(changed to QD 3/23)  RENAL Lab Results  Component Value Date   CREATININE 0.79 05/28/2014   CREATININE 0.77 05/27/2014   CREATININE 0.82 05/26/2014    Intake/Output Summary (Last 24 hours) at 05/28/14 0842 Last data filed at 05/28/14 0700  Gross per 24 hour  Intake 3094.12 ml  Output   3875 ml  Net -780.88 ml    A:   AKI from sepsis >> improved. hypoK P:   Monitor renal fx Lasix 40 mg IV QD Replace electrolytes as indicated. BMET in am  [facility] IVF  GASTROINTESTINAL A:   Nutrition. P:   TFs to goal Stools were excessive now improved  HEMATOLOGIC A:   Leukocytosis. Dilutional anemia P:  F/u CBC Lovenox  INFECTIOUS A:   Pneumonia with pneumococcal Ag positive. 3/22 fever 102 re culture and start vanc  P:   Rocephin 3/15>>> Vanco 3/22(gfever 102)>> 3/15 zithromax>>> 3/19  tamiflu 3/15 >>3/20  Pneumococcal Ag 3/15 >> positive Legionella Ag 3/15 >>neg Influenza PCR 3/15>> H1N1 pos c diff 3/18 >>neg  3/22 bc x 2>> 3/22 uc>> 3/22 sputum>>  ENDOCRINE CBG (last 3)   Recent Labs  05/28/14 0025 05/28/14 0331 05/28/14 0735  GLUCAP 272* 230* 191*     A:   Hx of DM, hypothyroidism.r/o rel AI Uncontrolled glucose P:   Continue synthroid SSI CBG's q4hr Increase lantus to 40 on 3/23 TF coverage  NEUROLOGIC A:   Anxiety, insomnia, chronic pain. Severe ards, vent dyschrony SUSPECTED cip P:   PRN tylenol Hold outpatient amitriptyline, norco. Propofol Armida Sans gtt 3/22 diprivan restarted due to htn  and anxiety PT when better  Family - updated bedside  Discussion: Plan for bedside trach today(3/23). Fever curve is now down. We will dc a line and pursue PICC after trach as we transition to more long term goals. She may need PEG if unable to swallow. Fever may have been from hyperdynamic state from under sedation 3/22. Will,continue abx till culture data matures.   Brett Canales Minor ACNP [facility] Pager [phone] till 3 pm If no answer page [phone]  Will proceed with tracheostomy today.  Patient is unable to wean, high RR and low TV when bedside.  Highly doubtful will be able to wean with neurologic disease.  Will consult IR for PEG.  Hold weaning for now and will need  LTAC placement for further weaning likely next week.  Order for PEG is in.  The patient is critically ill with multiple organ systems failure and requires high complexity decision making for assessment and support, frequent evaluation and titration of therapies, application of advanced monitoring technologies and extensive interpretation of multiple databases.   Critical Care Time devoted to patient care services described in this note is  35  Minutes. This time reflects time of care of this signee Dr Koren BoundWesam Milburn Freeney. This critical care time does not reflect procedure time, or teaching time or supervisory time of PA/NP/Med student/Med Resident etc but could involve care discussion time.  Alyson ReedyWesam G. Leota Maka, M.D. [facility]. Pager: [phone]. After hours pager: [phone].

## 2014-05-28 NOTE — Progress Notes (Signed)
CARE MANAGEMENT NOTE 05/28/2014  Patient:  Grace Mitchell,Grace Mitchell   Account Number:  [id number]  Date Initiated:  05/21/2014  Documentation initiated by:  DAVIS,RHONDA  Subjective/Objective Assessment:   sepsis and influenza     Action/Plan:   home when stable   Anticipated DC Date:  05/29/2014   Anticipated DC Plan:  LONG TERM ACUTE CARE (LTAC)  In-house referral  NA      DC Planning Services  NA      [facility]   Choice offered to / List presented to:             Status of service:  In process, will continue to follow Medicare Important Message given?   (If response is "NO", the following Medicare IM given date fields will be blank) Date Medicare IM given:   Medicare IM given by:   Date Additional Medicare IM given:   Additional Medicare IM given by:    Discharge Disposition:    Per UR Regulation:  Reviewed for med. necessity/level of care/duration of stay  If discussed at Long Length of Stay Meetings, dates discussed:    Comments:  May 28, 2014/Rhonda L. Earlene Plateravis, RN, BSN, CCM. Case Management [facility] [employer] [phone] Patient trached this am, request for [facility] to review for [possible transfer for continued care.  May 26, 2014/Rhonda L. Earlene Plateravis, RN, BSN, CCM. Case Management [facility] [phone] No discharge needs present of time of review. remains on the vent, 50%fio2, hypotensive, agitated on propofol iv drip.  May 21, 2014/Rhonda L. Earlene Plateravis, RN, BSN, CCM. Case Management [facility] [employer] [phone] No discharge needs present of time of review.

## 2014-05-28 NOTE — Progress Notes (Signed)
Hypoglycemic Event  CBG: 46  Treatment: D50 IV 25 mL  Symptoms: None  Follow-up CBG: Time:2020 CBG Result:96  Possible Reasons for Event: Other: tube feed was off for procedure  Comments/MD notified: Particia JasperFeinstein    Kobey Sides H  Remember to initiate Hypoglycemia Order Set & complete

## 2014-05-28 NOTE — Consult Note (Signed)
Reason for consult: percutaneous gastrostomy tube placement  Referring Physician(s): CCM  History of Present Illness: Grace Mitchell is a 69 y.o. female with past medical history significant for hypertension, hyperlipidemia, diabetes, recently admitted with shortness of breath, fever, chills, flulike symptoms and subsequently developed respiratory failure secondary to pneumococcal pneumonia/sepsis. Patient was unable to be weaned off ventilator and underwent tracheostomy placement on 05/28/14. Due to anticipated need for long-term nutritional supplementation and probable LTAC transfer request has now been made for percutaneous gastrostomy tube placement.  Past Medical History  Diagnosis Date  . Diabetes mellitus   . Hypothyroidism   . Hypertension   . Hyperlipidemia     Past Surgical History  Procedure Laterality Date  . Back surgery      Allergies: Review of patient's allergies indicates no known allergies.  Medications: Prior to Admission medications   Medication Sig Start Date End Date Taking? Authorizing Provider  amitriptyline (ELAVIL) 50 MG tablet Take 50 mg by mouth at bedtime.   Yes Historical Provider, MD  aspirin EC 81 MG tablet Take 81 mg by mouth daily.   Yes Historical Provider, MD  HYDROcodone-acetaminophen (NORCO/VICODIN) 5-325 MG per tablet Take 1-2 tablets by mouth every 6 hours as needed for pain. Patient not taking: Reported on 05/20/2014 09/11/12   Joni Reining Pisciotta, PA-C  insulin glargine (LANTUS) 100 UNIT/ML injection Inject 40-60 Units into the skin 2 (two) times daily. 40 units in the morning and 60 units at bedtime   Yes Historical Provider, MD  levothyroxine (SYNTHROID, LEVOTHROID) 150 MCG tablet Take 150 mcg by mouth daily before breakfast.   Yes Historical Provider, MD  ramipril (ALTACE) 5 MG capsule Take 5 mg by mouth daily.   Yes Historical Provider, MD  simvastatin (ZOCOR) 20 MG tablet Take 20 mg by mouth every evening.   Yes Historical Provider, MD    sitaGLIPtan-metformin (JANUMET) 50-500 MG per tablet Take 1 tablet by mouth daily.    Yes Historical Provider, MD     History reviewed. No pertinent family history.  History   Social History  . Marital Status: Married    Spouse Name: N/A  . Number of Children: N/A  . Years of Education: N/A   Social History Main Topics  . Smoking status: Former Smoker -- 0.50 packs/day    Quit date: 05/06/2014  . Smokeless tobacco: Never Used  . Alcohol Use: No  . Drug Use: No  . Sexual Activity: Not on file   Other Topics Concern  . None   Social History Narrative        Review of Systems  See above  Vital Signs: BP 143/55 mmHg  Pulse 103  Temp(Src) 99.1 F (37.3 C) (Core (Comment))  Resp 30  Ht  (1.676 m)  Wt 175 lb 7.8 oz (79.6 kg)  BMI 28.34 kg/m2  SpO2 97%  Physical Exam patient sedated, tracheostomy in place; chest with scattered rhonchi; heart -slightly tachycardic but regular rhythm; abdomen soft, positive bowel sounds, nondistended; extremities with mild edema  Mallampati Score:     Imaging: Dg Chest Port 1 View  05/28/2014   CLINICAL DATA:  69 year old female with respiratory failure. Subsequent encounter.  EXAM: PORTABLE CHEST - 1 VIEW  COMPARISON:  05/28/2014 4:54 a.m.  FINDINGS: Tracheostomy tube tip midline.  Left central line tip mid superior vena cava level.  Nasogastric tube courses below the diaphragm. Tip is not included on the present exam.  Diffuse slightly asymmetric airspace disease relatively similar to prior  exam. This may represent pulmonary edema/diffuse infiltrates.  No gross pneumothorax.  Calcified aorta.  Heart size top-normal  IMPRESSION: No significant change in diffuse asymmetric airspace disease.   Electronically Signed   By: Lacy Duverney M.D.   On: 05/28/2014 12:11   Dg Chest Port 1 View  05/28/2014   CLINICAL DATA:  Respiratory failure .  EXAM: PORTABLE CHEST - 1 VIEW  COMPARISON:  05/27/2014 .  FINDINGS: Endotracheal tube, NG tube,  left IJ line stable position. Heart size stable. Diffuse pulmonary infiltrates are present and unchanged. Tiny left pleural effusion cannot be excluded. No pneumothorax. No acute osseous abnormality.  IMPRESSION: 1. Lines and tubes in stable position. 2. Diffuse bilateral pulmonary infiltrates, unchanged. Small left pleural effusion cannot be excluded.   Electronically Signed   By: Maisie Fus  Register   On: 05/28/2014 07:16   Dg Chest Port 1 View  05/27/2014   CLINICAL DATA:  Respiratory failure.  Endotracheal tube.  EXAM: PORTABLE CHEST - 1 VIEW  COMPARISON:  05/26/2014  FINDINGS: Endotracheal tube is 3.8 cm above the carina. Nasogastric tube extends into the stomach. Left jugular central line extends into the SVC. Diffuse airspace opacities persist bilaterally and may be slightly improved compared to the previous day.  IMPRESSION: Support equipment appears satisfactorily positioned.  Persistent diffuse airspace opacities with slight improvement.   Electronically Signed   By: Ellery Plunk M.D.   On: 05/27/2014 06:49   Dg Chest Port 1 View  05/26/2014   CLINICAL DATA:  Pneumonia  EXAM: PORTABLE CHEST - 1 VIEW  COMPARISON:  05/25/2014  FINDINGS: Endotracheal tube tip is 4 cm above the carina. Nasogastric tube extends into the stomach. Left jugular central line extends into the SVC. Diffuse airspace opacities persist without significant interval change.  IMPRESSION: Support equipment appears satisfactorily positioned.  No significant interval change in the bilateral airspace opacities.   Electronically Signed   By: Ellery Plunk M.D.   On: 05/26/2014 05:19   Dg Chest Port 1 View  05/25/2014   CLINICAL DATA:  Followup influenza with bilateral lung infiltrates.  EXAM: PORTABLE CHEST - 1 VIEW  COMPARISON:  05/24/2014  FINDINGS: Bilateral airspace lung opacities have shown a minimal degree of improvement since the previous day's study. No new lung abnormalities. No pneumothorax.  Endotracheal tube, left  internal jugular central venous line and nasogastric tube are stable in well positioned.  IMPRESSION: Minimal improvement in bilateral lung opacities since the previous day's study. No other change.   Electronically Signed   By: Amie Portland M.D.   On: 05/25/2014 07:10   Dg Chest Port 1 View  05/24/2014   CLINICAL DATA:  ARDS  EXAM: PORTABLE CHEST - 1 VIEW  COMPARISON:  05/23/2014  FINDINGS: Endotracheal tube tip is 3 cm above the carina. Left jugular central line extends into the SVC. Nasogastric tube extends into the stomach. Diffuse airspace opacities persist bilaterally without significant interval change.  IMPRESSION: Support equipment appears satisfactorily positioned.  No significant interval change in the bilateral airspace opacities.   Electronically Signed   By: Ellery Plunk M.D.   On: 05/24/2014 06:59   Dg Chest Port 1 View  05/23/2014   CLINICAL DATA:  ARDS  EXAM: PORTABLE CHEST - 1 VIEW  COMPARISON:  05/22/2014  FINDINGS: Endotracheal tube tip is 3 cm above the carina. Left jugular central line extends into the SVC. Nasogastric tube extends into the stomach. There is no interval change in the diffuse airspace opacities.  IMPRESSION: Support equipment  appears satisfactorily positioned. No significant interval change in the diffuse airspace opacities.   Electronically Signed   By: Ellery Plunk M.D.   On: 05/23/2014 05:31   Dg Chest Port 1 View  05/22/2014   CLINICAL DATA:  Pneumonia, intubated.  EXAM: PORTABLE CHEST - 1 VIEW  COMPARISON:  05/21/2014  FINDINGS: Endotracheal tube is 3.5 cm above the carina. Nasogastric tube extends into the stomach. Left jugular central line extends into the SVC. Diffuse airspace opacities persist bilaterally without significant interval change.  IMPRESSION: Support equipment appears satisfactorily positioned. Diffusely distributed airspace opacities without significant interval change.   Electronically Signed   By: Ellery Plunk M.D.   On:  05/22/2014 06:21   Dg Chest Port 1 View  05/21/2014   CLINICAL DATA:  Evaluate tubes and lines.  Respiratory distress.  EXAM: PORTABLE CHEST - 1 VIEW  COMPARISON:  Portable film earlier in the day.  FINDINGS: Endotracheal tube has been placed, with its tip 4.3 cm above carina. Orogastric tube in the stomach. Central venous line from LEFT IJ approach lies with its tip at the cavoatrial junction. Normal cardiac size. Diffuse airspace disease appears worse. BILATERAL pulmonary opacities with near complete white out. No visible pneumothorax.  IMPRESSION: Satisfactory tube and line placement.  Worsening ARDS pattern.   Electronically Signed   By: Davonna Belling M.D.   On: 05/21/2014 14:40   Dg Chest Port 1 View  05/21/2014   CLINICAL DATA:  Respiratory failure  EXAM: PORTABLE CHEST - 1 VIEW  COMPARISON:  05/20/2014  FINDINGS: Dense consolidation persists bilaterally without significant interval change. No large effusion is evident.  IMPRESSION: Persistent dense consolidation bilaterally.   Electronically Signed   By: Ellery Plunk M.D.   On: 05/21/2014 07:03   Dg Chest Port 1 View  05/21/2014   CLINICAL DATA:  Worsening shortness of breath and decreased O2 saturation. Initial encounter.  EXAM: PORTABLE CHEST - 1 VIEW  COMPARISON:  Chest radiograph performed earlier today at 4:36 p.m.  FINDINGS: Significantly worsened diffuse bilateral airspace opacification may reflect diffuse pneumonia, pulmonary edema or ARDS. No pleural effusion or pneumothorax is seen.  The cardiomediastinal silhouette is borderline normal in size. No acute osseous abnormalities are identified.  IMPRESSION: Significantly worsened diffuse bilateral airspace opacification may reflect diffuse pneumonia, pulmonary edema or ARDS.   Electronically Signed   By: Roanna Raider M.D.   On: 05/21/2014 01:18   Dg Chest Port 1 View  05/20/2014   CLINICAL DATA:  Fever for 4 days.  EXAM: PORTABLE CHEST - 1 VIEW  COMPARISON:  04/23/2005  FINDINGS:  Patchy bilateral mid and lower lung zone airspace opacities have developed. Normal heart size. No pneumothorax.  IMPRESSION: Patchy bilateral airspace disease worrisome for pneumonia, ARDS, or pulmonary edema.   Electronically Signed   By: Jolaine Click M.D.   On: 05/20/2014 16:43   Dg Abd Portable 1v  05/28/2014   CLINICAL DATA:  NG tube replacement.  EXAM: PORTABLE ABDOMEN - 1 VIEW  COMPARISON:  None.  FINDINGS: The tip of the NG tube is in the distal stomach. The stomach is collapsed. The bowel gas pattern is normal.  IMPRESSION: The tip of the NG tube is in the distal stomach which is collapsed.   Electronically Signed   By: Marin Roberts M.D.   On: 05/28/2014 12:12    Labs:  CBC:  Recent Labs  05/25/14 0635 05/26/14 0620 05/27/14 0405 05/28/14 0400  WBC 11.4* 14.6* 18.9* 22.1*  HGB 7.4* 8.1*  7.7* 7.3*  HCT 23.3* 25.5* 25.0* 23.7*  PLT 225 238 256 223    COAGS:  Recent Labs  05/20/14 1627 05/28/14 0400  INR 1.15 1.03  APTT  --  27    BMP:  Recent Labs  05/25/14 2010 05/26/14 0620 05/27/14 0405 05/28/14 0400  NA 139 142 146* 147*  K 4.9 4.1 3.8 3.5  CL 106 103 105 101  CO2 27 29 31  35*  GLUCOSE 313* 245* 259* 237*  BUN 30* 33* 42* 46*  CALCIUM 8.3* 8.5 8.4 8.4  CREATININE 0.84 0.82 0.77 0.79  GFRNONAA 71* 73* 86* 85*  GFRAA 82* 85* >90 >90    LIVER FUNCTION TESTS:  Recent Labs  05/20/14 1627 05/25/14 0635  BILITOT 0.9 0.8  AST 29 47*  ALT 11 32  ALKPHOS 79 148*  PROT 7.6 4.9*  ALBUMIN 3.6 1.4*    TUMOR MARKERS: No results for input(s): AFPTM, CEA, CA199, CHROMGRNA in the last 8760 hours.  Assessment and Plan: Grace Mitchell is a 69 y.o. female with past medical history significant for hypertension, hyperlipidemia, diabetes, recently admitted with shortness of breath, fever, chills, flulike symptoms and subsequently developed respiratory failure secondary to pneumococcal pneumonia/sepsis. Patient was unable to be weaned off ventilator and  underwent tracheostomy placement on 05/28/14. Due to anticipated need for long-term nutritional supplementation and probable LTAC transfer request has now been made for percutaneous gastrostomy tube placement. Imaging studies have been reviewed by Dr. Archer AsaMcCullough and patient is a candidate for percutaneous gastrostomy tube placement anatomically. Patient continues to have mild temperature elevations and white count currently at 22.1. Blood cultures are pending. She is currently receiving IV Rocephin and vancomycin. Chest x-ray today shows no significant change in diffuse asymmetric airspace disease. Per discussion with Dr. Archer AsaMcCullough would prefer to wait until white blood cell count has improved , temperature has normalized and follow-up blood cultures finalized. Details/risks of gastrostomy tube placement, including but not limited to, internal bleeding, worsening infection, injury to adjacent organs were discussed with patient's family with their understanding and consent. Will continue to monitor.     Signed: Chinita PesterALLRED,D KEVIN 05/28/2014, 3:32 PM   I spent a total of 20 minutes    in face to face in clinical consultation, greater than 50% of which was counseling/coordinating care for percutaneous gastrostomy tube placement

## 2014-05-29 ENCOUNTER — Inpatient Hospital Stay (HOSPITAL_COMMUNITY): Payer: Commercial Managed Care - HMO

## 2014-05-29 LAB — CULTURE, RESPIRATORY: SPECIAL REQUESTS: NORMAL

## 2014-05-29 LAB — PREPARE RBC (CROSSMATCH)

## 2014-05-29 LAB — CBC
HEMATOCRIT: 21.5 % — AB (ref 36.0–46.0)
Hemoglobin: 6.6 g/dL — CL (ref 12.0–15.0)
MCH: 26.6 pg (ref 26.0–34.0)
MCHC: 30.7 g/dL (ref 30.0–36.0)
MCV: 86.7 fL (ref 78.0–100.0)
Platelets: 234 10*3/uL (ref 150–400)
RBC: 2.48 MIL/uL — ABNORMAL LOW (ref 3.87–5.11)
RDW: 15.1 % (ref 11.5–15.5)
WBC: 23.1 10*3/uL — AB (ref 4.0–10.5)

## 2014-05-29 LAB — MAGNESIUM: Magnesium: 2.1 mg/dL (ref 1.5–2.5)

## 2014-05-29 LAB — GLUCOSE, CAPILLARY
GLUCOSE-CAPILLARY: 180 mg/dL — AB (ref 70–99)
Glucose-Capillary: 155 mg/dL — ABNORMAL HIGH (ref 70–99)
Glucose-Capillary: 229 mg/dL — ABNORMAL HIGH (ref 70–99)
Glucose-Capillary: 239 mg/dL — ABNORMAL HIGH (ref 70–99)
Glucose-Capillary: 206 mg/dL — ABNORMAL HIGH (ref 70–99)
Glucose-Capillary: 201 mg/dL — ABNORMAL HIGH (ref 70–99)

## 2014-05-29 LAB — PHOSPHORUS: PHOSPHORUS: 3.5 mg/dL (ref 2.3–4.6)

## 2014-05-29 LAB — BASIC METABOLIC PANEL
ANION GAP: 11 (ref 5–15)
BUN: 43 mg/dL — ABNORMAL HIGH (ref 6–23)
CHLORIDE: 105 mmol/L (ref 96–112)
CO2: 31 mmol/L (ref 19–32)
Calcium: 8.1 mg/dL — ABNORMAL LOW (ref 8.4–10.5)
Creatinine, Ser: 0.76 mg/dL (ref 0.50–1.10)
GFR calc non Af Amer: 86 mL/min — ABNORMAL LOW (ref >90)
Glucose, Bld: 162 mg/dL — ABNORMAL HIGH (ref 70–99)
Potassium: 3.6 mmol/L (ref 3.5–5.1)
SODIUM: 147 mmol/L — AB (ref 135–145)

## 2014-05-29 LAB — CULTURE, RESPIRATORY W GRAM STAIN

## 2014-05-29 LAB — ABO/RH: ABO/RH(D): O POS

## 2014-05-29 LAB — VANCOMYCIN, TROUGH: Vancomycin Tr: 23.5 ug/mL — ABNORMAL HIGH (ref 10.0–20.0)

## 2014-05-29 MED ORDER — INSULIN GLARGINE 100 UNIT/ML ~~LOC~~ SOLN
45.0000 [IU] | Freq: Every day | SUBCUTANEOUS | Status: DC
  Administered 2014-05-30 – 2014-06-01 (×3): 45 [IU] via SUBCUTANEOUS
  Filled 2014-05-29 (×4): qty 0.45

## 2014-05-29 MED ORDER — VANCOMYCIN HCL IN DEXTROSE 750-5 MG/150ML-% IV SOLN
750.0000 mg | Freq: Two times a day (BID) | INTRAVENOUS | Status: DC
Start: 2014-05-29 — End: 2014-05-30
  Administered 2014-05-29 – 2014-05-30 (×2): 750 mg via INTRAVENOUS
  Filled 2014-05-29 (×2): qty 150

## 2014-05-29 MED ORDER — SODIUM CHLORIDE 0.9 % IV SOLN
Freq: Once | INTRAVENOUS | Status: AC
  Administered 2014-05-29: 04:00:00 via INTRAVENOUS

## 2014-05-29 MED ORDER — SODIUM CHLORIDE 0.9 % IV BOLUS (SEPSIS)
250.0000 mL | Freq: Once | INTRAVENOUS | Status: AC
Start: 2014-05-29 — End: 2014-05-29
  Administered 2014-05-29: 250 mL via INTRAVENOUS

## 2014-05-29 MED ORDER — POTASSIUM CHLORIDE 20 MEQ/15ML (10%) PO SOLN
20.0000 meq | ORAL | Status: AC
Start: 2014-05-29 — End: 2014-05-29
  Administered 2014-05-29 (×2): 20 meq
  Filled 2014-05-29 (×2): qty 15

## 2014-05-29 NOTE — Progress Notes (Signed)
Inpatient Diabetes Program Recommendations  AACE/ADA: New Consensus Statement on Inpatient Glycemic Control (2013)  Target Ranges:  Prepandial:   less than 140 mg/dL      Peak postprandial:   less than 180 mg/dL (1-2 hours)      Critically ill patients:  140 - 180 mg/dL     Results for Grace Mitchell, Grace Mitchell (MRN [id number]) as of 05/29/2014 08:32  Ref. Range 05/28/2014 00:25 05/28/2014 03:31 05/28/2014 07:35 05/28/2014 11:35 05/28/2014 15:58 05/28/2014 19:49 05/28/2014 20:18  Glucose-Capillary Latest Range: 70-99 mg/dL 409272 (H) 811230 (H) 914191 (H) 136 (H) 92 46 (L) 97    Results for Grace Mitchell, Grace Mitchell (MRN [id number]) as of 05/29/2014 08:32  Ref. Range 05/28/2014 23:27 05/29/2014 04:10  Glucose-Capillary Latest Range: 70-99 mg/dL 086155 (H) 578180 (H)     Outpatient Diabetes medications: Lantus 40 units q AM and 60 units q HS  Janumet 50-500 mg one daily   Current orders for Inpatient glycemic control: Lantus 40 units daily  Novolog 3 units q 4 hours to cover tube feeds   Novolog Resistant SSI q 4 hours     **Note patient's tube feeds were turned off around 5AM yesterday morning and were not resumed until approximately 8PM last evening for bedside trach placement.  **RN gave Novolog tube feed coverage (3 units Q4 hours) throughout the day even though tube feeds were turned off and as a result patient had Hypoglycemia at 8PM (CBG 46 mg/dl).    Will follow Ambrose FinlandJeannine Johnston [name] RN, MSN, CDE Diabetes Coordinator Inpatient Diabetes Program Team Pager: [phone] (8a-5p)

## 2014-05-29 NOTE — Care Management Note (Signed)
CARE MANAGEMENT NOTE 05/29/2014  Patient:  Grace Mitchell,Grace Mitchell   Account Number:  [id number]  Date Initiated:  05/21/2014  Documentation initiated by:  Lilee Aldea  Subjective/Objective Assessment:   sepsis and influenza     Action/Plan:   home when stable   Anticipated DC Date:  06/01/2014   Anticipated DC Plan:  LONG TERM ACUTE CARE (LTAC)  In-house referral  NA      DC Planning Services  NA      [facility]  NA   Choice offered to / List presented to:             Status of service:  In process, will continue to follow Medicare Important Message given?   (If response is "NO", the following Medicare IM given date fields will be blank) Date Medicare IM given:   Medicare IM given by:   Date Additional Medicare IM given:   Additional Medicare IM given by:    Discharge Disposition:    Per UR Regulation:  Reviewed for med. necessity/level of care/duration of stay  If discussed at Long Length of Stay Meetings, dates discussed:    Comments:  May 29, 2014/Suhaan Perleberg L. Earlene Plateravis, RN, BSN, CCM. Case Management [facility] [phone] No discharge needs present of time of review.   May 28, 2014/Rielle Schlauch L. Earlene Plateravis, RN, BSN, CCM. Case Management [facility] [phone] Patient trached this am, request for [facility] to review for [possible transfer for continued care.  May 26, 2014/Laaibah Wartman L. Earlene Plateravis, RN, BSN, CCM. Case Management [facility] [phone] No discharge needs present of time of review. remains on the vent, 50%fio2, hypotensive, agitated on propofol iv drip.  May 21, 2014/Jerrie Gullo L. Earlene Plateravis, RN, BSN, CCM. Case Management [facility] [phone] No discharge needs present of time of review.

## 2014-05-29 NOTE — Progress Notes (Signed)
CRITICAL VALUE ALERT  Critical value received:  Hgb 6.6  Date of notification:  05/29/14  Time of notification:  0347  Critical value read back:Yes.    Nurse who received alert:  Effie BerkshireAlex Dariush Mcnellis  MD notified (1st page):  Vassie LollAlva  Time of first page:  0350  MD notified (2nd page):  Time of second page:  Responding MD:  Vassie LollAlva  Time MD responded:  [phone]

## 2014-05-29 NOTE — Progress Notes (Signed)
ANTIBIOTIC CONSULT NOTE - FOLLOW UP  Pharmacy Consult for vancomycin Indication: pneumonia  No Known Allergies  Patient Measurements: Height:  (167.6 cm) Weight: 171 lb 8.3 oz (77.8 kg) IBW/kg (Calculated) : 59.3   Vital Signs: Temp: 99.3 F (37.4 C) (03/24 1000) Temp Source: Core (Comment) (03/24 0000) BP: 164/67 mmHg (03/24 0824) Pulse Rate: 112 (03/24 1000) Intake/Output from previous day: 03/23 0701 - 03/24 0700 In: 3110.5 [I.V.:1400.5; NG/GT:710; IV Piggyback:700] Out: 840 [Urine:840] Intake/Output from this shift: Total I/O In: 720.5 [I.V.:130.5; Blood:175; NG/GT:215; IV Piggyback:200] Out: 250 [Urine:250]  Labs:  Recent Labs  05/27/14 0405 05/28/14 0400 05/28/14 1502 05/29/14 0314  WBC 18.9* 22.1*  --  23.1*  HGB 7.7* 7.3*  --  6.6*  PLT 256 223  --  234  CREATININE 0.77 0.79 0.73 0.76   Estimated Creatinine Clearance: 72.8 mL/min (by C-G formula based on Cr of 0.76).  Recent Labs  05/29/14 0914  VANCOTROUGH 23.5*     Microbiology: Recent Results (from the past 720 hour(s))  Urine culture     Status: None   Collection Time: 05/20/14  3:49 PM  Result Value Ref Range Status   Specimen Description URINE, CLEAN CATCH  Final   Special Requests NONE  Final   Colony Count   Final    30,000 COLONIES/ML Performed at [facility]    Culture   Final    Multiple bacterial morphotypes present, none predominant. Suggest appropriate recollection if clinically indicated. Performed at [facility]    Report Status 05/21/2014 FINAL  Final  MRSA PCR Screening     Status: None   Collection Time: 05/20/14  4:04 PM  Result Value Ref Range Status   MRSA by PCR NEGATIVE NEGATIVE Final    Comment:        The GeneXpert MRSA Assay (FDA approved for NASAL specimens only), is one component of a comprehensive MRSA colonization surveillance program. It is not intended to diagnose MRSA infection nor to guide or monitor treatment for MRSA  infections.   Blood Culture (routine x 2)     Status: None   Collection Time: 05/20/14  4:26 PM  Result Value Ref Range Status   Specimen Description BLOOD LEFT ANTECUBITAL  Final   Special Requests BOTTLES DRAWN AEROBIC AND ANAEROBIC  Final   Culture   Final    NO GROWTH 5 DAYS Performed at [facility]    Report Status 05/26/2014 FINAL  Final  Blood Culture (routine x 2)     Status: None   Collection Time: 05/20/14  4:34 PM  Result Value Ref Range Status   Specimen Description BLOOD LEFT HAND  Final   Special Requests BOTTLES DRAWN AEROBIC AND ANAEROBIC 4CC  Final   Culture   Final    NO GROWTH 5 DAYS Performed at [facility]    Report Status 05/26/2014 FINAL  Final  Culture, respiratory (NON-Expectorated)     Status: None   Collection Time: 05/21/14 12:32 PM  Result Value Ref Range Status   Specimen Description TRACHEAL ASPIRATE  Final   Special Requests Normal  Final   Gram Stain   Final    FEW WBC PRESENT,BOTH PMN AND MONONUCLEAR RARE SQUAMOUS EPITHELIAL CELLS PRESENT NO ORGANISMS SEEN Performed at [facility]    Culture   Final    NO GROWTH 2 DAYS Performed at [facility]    Report Status 05/23/2014 FINAL  Final  Clostridium Difficile by PCR  Status: None   Collection Time: 05/23/14  8:56 AM  Result Value Ref Range Status   C difficile by pcr NEGATIVE NEGATIVE Final  Culture, respiratory (NON-Expectorated)     Status: None   Collection Time: 05/27/14 10:00 AM  Result Value Ref Range Status   Specimen Description TRACHEAL ASPIRATE  Final   Special Requests Normal  Final   Gram Stain   Final    ABUNDANT WBC PRESENT, PREDOMINANTLY PMN NO SQUAMOUS EPITHELIAL CELLS SEEN NO ORGANISMS SEEN Performed at [facility]    Culture   Final    FEW CANDIDA ALBICANS Performed at [facility]    Report Status 05/29/2014 FINAL  Final  Culture, Urine     Status: None   Collection Time: 05/27/14 10:00 AM  Result  Value Ref Range Status   Specimen Description URINE, CATHETERIZED  Final   Special Requests Normal  Final   Colony Count NO GROWTH Performed at [facility]   Final   Culture NO GROWTH Performed at [facility]   Final   Report Status 05/28/2014 FINAL  Final  Culture, blood (routine x 2)     Status: None (Preliminary result)   Collection Time: 05/27/14 10:35 AM  Result Value Ref Range Status   Specimen Description BLOOD RIGHT HAND  Final   Special Requests BOTTLES DRAWN AEROBIC ONLY 3CC  Final   Culture   Final           BLOOD CULTURE RECEIVED NO GROWTH TO DATE CULTURE WILL BE HELD FOR 5 DAYS BEFORE ISSUING A FINAL NEGATIVE REPORT Performed at [facility]    Report Status PENDING  Incomplete  Culture, blood (routine x 2)     Status: None (Preliminary result)   Collection Time: 05/27/14 10:45 AM  Result Value Ref Range Status   Specimen Description BLOOD RIGHT ARM  Final   Special Requests BOTTLES DRAWN AEROBIC AND ANAEROBIC 6CC  Final   Culture   Final           BLOOD CULTURE RECEIVED NO GROWTH TO DATE CULTURE WILL BE HELD FOR 5 DAYS BEFORE ISSUING A FINAL NEGATIVE REPORT Performed at [facility]    Report Status PENDING  Incomplete    Anti-infectives    Start     Dose/Rate Route Frequency Ordered Stop   05/27/14 1100  vancomycin (VANCOCIN) IVPB 1000 mg/200 mL premix     1,000 mg 200 mL/hr over 60 Minutes Intravenous 2 times daily 05/27/14 1017     05/24/14 1000  oseltamivir (TAMIFLU) 6 MG/ML suspension 150 mg     150 mg Per Tube Every 12 hours 05/24/14 0916 05/25/14 2250   05/21/14 1400  oseltamivir (TAMIFLU) 6 MG/ML suspension 75 mg  Status:  Discontinued     75 mg Per Tube Every 12 hours 05/21/14 1258 05/24/14 0916   05/20/14 2200  oseltamivir (TAMIFLU) capsule 75 mg  Status:  Discontinued     75 mg Oral 2 times daily 05/20/14 1918 05/21/14 1258   05/20/14 2200  cefTRIAXone (ROCEPHIN) 1 g in dextrose 5 % 50 mL IVPB     1 g 100 mL/hr  over 30 Minutes Intravenous Every 24 hours 05/20/14 1950     05/20/14 2000  azithromycin (ZITHROMAX) 500 mg in dextrose 5 % 250 mL IVPB  Status:  Discontinued     500 mg 250 mL/hr over 60 Minutes Intravenous Every 24 hours 05/20/14 1950 05/24/14 0916   05/20/14 1830  oseltamivir (TAMIFLU)  capsule 75 mg  Status:  Discontinued     75 mg Oral Daily 05/20/14 1825 05/20/14 1918   05/20/14 1630  piperacillin-tazobactam (ZOSYN) IVPB 3.375 g     3.375 g 100 mL/hr over 30 Minutes Intravenous  Once 05/20/14 1623 05/20/14 1711   05/20/14 1630  vancomycin (VANCOCIN) IVPB 1000 mg/200 mL premix     1,000 mg 200 mL/hr over 60 Minutes Intravenous  Once 05/20/14 1623 05/20/14 1813      Assessment: Patient is a 69 y.o F on vancomycin and ceftriaxone for PNA. CXR on 3/24 showed no new opacity but she remains febrile with elevated wbc.  Vancomycin trough level now back slightly elevated at 23.5.  Scr is stable.  3/15 >> Vanc >> 3/15; restart 3/22 >> 3/15 >> Zosyn>> 3/15 3/15 >> Ceftriaxone >> 3/15 >> Azithromycin >> 3/19 3/15 >> Tamiflu >> 3/20  Tmax: 100.8 WBCs: elevated and continuing to rise Renal: Scr improved to WNL, CrCl 78 ml/min PCT 6.94 > 9.26 (3/16) LA 3.31 > 2.5 (3/15)  3/15 blood x 2: NGF 3/16 urine: 30K cfu/ml, no predominant species 3/16 influenza panel: + influenza A, H1N1 3/15 strep pneumo ur ag: POSITIVE 3/15 legionella ur ag: neg 3/16 trach asp cx: ng - final 3/18 cdiff negative 3/22 bcx x2: ngtd 3/22 ucx: neg FINAL 3/22 TA: few candida FINAL  Drug level / dose changes info: 3/24: 0900 VT = 23.5 on 1g q12h  Goal of Therapy:  Vancomycin trough level 15-20 mcg/ml  Plan:  - will decrease dose to 750mg  IV q12h  Idell Hissong P 05/29/2014,10:30 AM

## 2014-05-29 NOTE — Progress Notes (Signed)
PULMONARY / CRITICAL CARE MEDICINE   Name: Grace Mitchell MRN: [id number] DOB: 1945/06/18    ADMISSION DATE:  05/20/2014 CONSULTATION DATE:  05/29/2014  REFERRING MD :  EDP  CHIEF COMPLAINT:  Body aches, SOB, chills, fever  INITIAL PRESENTATION:  69 yo female smoker with dyspnea, fever (Tm103F), chills, malaise from H1N1 influenza & pneumococcal PNA and sepsis.  STUDIES:   SIGNIFICANT EVENTS: 3/15 admit 3/16 ETT for resp failure 3/17 paralytic for worsening pO2/FIO2  SUBJECTIVE:  Weaning sedation  VITAL SIGNS: Temp:  [98.8 F (37.1 C)-100.8 F (38.2 C)] 99.3 F (37.4 C) (03/24 1000) Pulse Rate:  [94-115] 112 (03/24 1000) Resp:  [21-30] 27 (03/24 1000) BP: (99-164)/(49-67) 164/67 mmHg (03/24 0824) SpO2:  [93 %-100 %] 94 % (03/24 1000) FiO2 (%):  [30 %] 30 % (03/24 0824) Weight:  [171 lb 8.3 oz (77.8 kg)] 171 lb 8.3 oz (77.8 kg) (03/24 0100) INTAKE / OUTPUT: Intake/Output      03/23 0701 - 03/24 0700 03/24 0701 - 03/25 0700   I.V. (mL/kg) 1400.5 (18) 130.5 (1.7)   Blood  175   Other 300    NG/GT 710 215   IV Piggyback 700 200   Total Intake(mL/kg) 3110.5 (40) 720.5 (9.3)   Urine (mL/kg/hr) 840 (0.4) 250 (0.9)   Total Output 840 250   Net +2270.5 +470.5          PHYSICAL EXAMINATION: General: Arousable, chronically ill appearing, sedated Neuro: Arousable, following commands, moving all ext weakly. HEENT: Trach site unremakable, jvd present Cardiovascular:  s1 s2 regular, tachycardic Lungs: diffuse rhonchi unchanged Abdomen: soft, non tender, no r/g, no r Musculoskeletal: mild edema Skin: no rashes  LABS:  CBC  Recent Labs Lab 05/27/14 0405 05/28/14 0400 05/29/14 0314  WBC 18.9* 22.1* 23.1*  HGB 7.7* 7.3* 6.6*  HCT 25.0* 23.7* 21.5*  PLT 256 223 234   Coag's  Recent Labs Lab 05/28/14 0400  APTT 27  INR 1.03   BMET  Recent Labs Lab 05/28/14 0400 05/28/14 1502 05/29/14 0314  NA 147* 149* 147*  K 3.5 3.6 3.6  CL 101 106 105  CO2 35*  33* 31  BUN 46* 43* 43*  CREATININE 0.79 0.73 0.76  GLUCOSE 237* 114* 162*   Electrolytes  Recent Labs Lab 05/27/14 0405 05/28/14 0400 05/28/14 1502 05/29/14 0314  CALCIUM 8.4 8.4 8.0* 8.1*  MG 1.5 1.6  --  2.1  PHOS 3.8 3.4  --  3.5   Sepsis Markers No results for input(s): LATICACIDVEN, PROCALCITON, O2SATVEN in the last 168 hours. ABG  Recent Labs Lab 05/26/14 1412 05/26/14 1620 05/27/14 0410  PHART 7.455* 7.451* 7.424  PCO2ART 46.4* 49.6* 51.7*  PO2ART 105.0* 96.1 66.6*   Liver Enzymes  Recent Labs Lab 05/25/14 0635  AST 47*  ALT 32  ALKPHOS 148*  BILITOT 0.8  ALBUMIN 1.4*   Cardiac Enzymes No results for input(s): TROPONINI, PROBNP in the last 168 hours. Glucose  Recent Labs Lab 05/28/14 1558 05/28/14 1949 05/28/14 2018 05/28/14 2327 05/29/14 0410 05/29/14 0801  GLUCAP 92 46* 97 155* 180* 229*    Imaging Dg Chest Port 1 View  05/29/2014   CLINICAL DATA:  Hypoxia/respiratory failure  EXAM: PORTABLE CHEST - 1 VIEW  COMPARISON:  May 28, 2014  FINDINGS: Tracheostomy tube tip is 6.2 cm above the carina. Central catheter tip is in the superior vena cava. Nasogastric tube tip and side port are below the diaphragm. No pneumothorax. There is interstitial and patchy alveolar edema bilaterally,  stable. No new opacity. Heart is upper normal in size with pulmonary vascularity within normal limits.  IMPRESSION: Moderate interstitial and patchy alveolar edema, stable. No new opacity. Tube and catheter positions as described without pneumothorax. No change in cardiac silhouette.   Electronically Signed   By: Bretta BangWilliam  Woodruff III M.D.   On: 05/29/2014 07:06   Dg Chest Port 1 View  05/28/2014   CLINICAL DATA:  69 year old female with respiratory failure. Subsequent encounter.  EXAM: PORTABLE CHEST - 1 VIEW  COMPARISON:  05/28/2014 4:54 a.m.  FINDINGS: Tracheostomy tube tip midline.  Left central line tip mid superior vena cava level.  Nasogastric tube courses below  the diaphragm. Tip is not included on the present exam.  Diffuse slightly asymmetric airspace disease relatively similar to prior exam. This may represent pulmonary edema/diffuse infiltrates.  No gross pneumothorax.  Calcified aorta.  Heart size top-normal  IMPRESSION: No significant change in diffuse asymmetric airspace disease.   Electronically Signed   By: Lacy DuverneySteven  Olson M.D.   On: 05/28/2014 12:11   Dg Chest Port 1 View  05/28/2014   CLINICAL DATA:  Respiratory failure .  EXAM: PORTABLE CHEST - 1 VIEW  COMPARISON:  05/27/2014 .  FINDINGS: Endotracheal tube, NG tube, left IJ line stable position. Heart size stable. Diffuse pulmonary infiltrates are present and unchanged. Tiny left pleural effusion cannot be excluded. No pneumothorax. No acute osseous abnormality.  IMPRESSION: 1. Lines and tubes in stable position. 2. Diffuse bilateral pulmonary infiltrates, unchanged. Small left pleural effusion cannot be excluded.   Electronically Signed   By: Maisie Fushomas  Register   On: 05/28/2014 07:16   Dg Abd Portable 1v  05/28/2014   CLINICAL DATA:  Feeding tube placement.  EXAM: PORTABLE ABDOMEN - 1 VIEW  COMPARISON:  05/28/2014.  FINDINGS: The feeding tube has been retracted. The tip is in the antral pyloric region of the stomach. Otherwise the exam is unchanged. Wire is present over the pelvis, likely thermistor from Foley catheter.  IMPRESSION: Enteric tube tip in the antropyloric of the stomach.   Electronically Signed   By: Andreas NewportGeoffrey  Lamke M.D.   On: 05/28/2014 18:21   Dg Abd Portable 1v  05/28/2014   CLINICAL DATA:  NG tube replacement.  EXAM: PORTABLE ABDOMEN - 1 VIEW  COMPARISON:  None.  FINDINGS: The tip of the NG tube is in the distal stomach. The stomach is collapsed. The bowel gas pattern is normal.  IMPRESSION: The tip of the NG tube is in the distal stomach which is collapsed.   Electronically Signed   By: Marin Robertshristopher  Mattern M.D.   On: 05/28/2014 12:12   ASSESSMENT / PLAN:  PULMONARY ET  3/16>>3/24 Trach 3/24>> A: ARDS 2nd to H1N1 PNA.(resolved) Tobacco abuse P:    Wean as tolerated ABG reviewed, keep same MV, decease to 40% and 5 cmH2O. PRN BD's dc NOT a home med Diureses  trach 3/23 due to CIP/Severe ARDS and suspected prolong rehab course  CARDIOVASCULAR A:  Sepsis 2nd to PNA. Hx HTN, HLD. P:  D/C levophed Cortisol adaquate   RENAL Lab Results  Component Value Date   CREATININE 0.76 05/29/2014   CREATININE 0.73 05/28/2014   CREATININE 0.79 05/28/2014    Intake/Output Summary (Last 24 hours) at 05/29/14 1036 Last data filed at 05/29/14 1000  Gross per 24 hour  Intake 3661.18 ml  Output   1090 ml  Net 2571.18 ml    A:   AKI from sepsis >> improved. hypoK P:   Monitor  renal fx Lasix 40 mg hold for now due to low bp requiring fluid bolus Replace electrolytes as indicated. BMET in am  [facility] IVF  GASTROINTESTINAL A:   Nutrition. P:   TFs to goal Stools were excessive now improved  HEMATOLOGIC A:   Leukocytosis. Dilutional anemia Anemia requiring transfusion 3/24 P:  F/u CBC Lovenox, stop 3/24 since requiring transfusion will use SCD's for DVT protection. Transfuse for Hgb<7  INFECTIOUS A:   Pneumonia with pneumococcal Ag positive. 3/22 fever 102 re culture and start vanc  P:   Rocephin 3/15>>> Vanco 3/22(fever 102)>> 3/15 zithromax>>> 3/19  tamiflu 3/15 >>3/20  Pneumococcal Ag 3/15 >> positive Legionella Ag 3/15 >>neg Influenza PCR 3/15>> H1N1 pos c diff 3/18 >>neg  3/22 bc x 2>> 3/22 uc>>neg 3/22 sputum>>  ENDOCRINE CBG (last 3)   Recent Labs  05/28/14 2327 05/29/14 0410 05/29/14 0801  GLUCAP 155* 180* 229*     A:   Hx of DM, hypothyroidism.r/o rel AI Uncontrolled glucose P:   Continue synthroid SSI CBG's q4hr Increase lantus to 45 on 3/24 TF coverage  NEUROLOGIC A:   Anxiety, insomnia, chronic pain. Severe ards, vent dyschrony SUSPECTED cip P:   PRN tylenol Hold outpatient amitriptyline,  norco. Decrease sedation as tolerated PT when better  Family - updated bedside  Discussion:  Bedside trach (3/23). Fever curve is now down. We will dc a line and pursue PICC after trach as we transition to more long term goals. She may need PEG if unable to swallow(IR has consulted). Fever may have been from hyperdynamic state from under sedation 3/22. Will,continue abx till culture data matures. Transfuse 3/24 hgb 6.6.  Brett Canales Minor ACNP [facility] PCCM Pager [phone] till 3 pm If no answer page [phone]  Spoke with family and updated them bedside at length.  Off pressors and paralytic, minimal sedation, need PEG, opens eyes but unresponsive.  Will attempt weaning again today.    The patient is critically ill with multiple organ systems failure and requires high complexity decision making for assessment and support, frequent evaluation and titration of therapies, application of advanced monitoring technologies and extensive interpretation of multiple databases.   Critical Care Time devoted to patient care services described in this note is  35  Minutes. This time reflects time of care of this signee Dr Koren Bound. This critical care time does not reflect procedure time, or teaching time or supervisory time of PA/NP/Med student/Med Resident etc but could involve care discussion time.  Alyson Reedy, M.D. [facility] Pulmonary/Critical Care Medicine. Pager: [phone]. After hours pager: [phone].

## 2014-05-29 NOTE — Progress Notes (Signed)
eLink Physician-Brief Progress Note Patient Name: Grace BoschLinda K Mitchell DOB: 04-Oct-1945 MRN: [id number]   Date of Service  05/29/2014  HPI/Events of Note  Hb drop  eICU Interventions  1 U PRBC     Intervention Category Intermediate Interventions: Bleeding - evaluation and treatment with blood products  [name]. 05/29/2014, 3:51 AM

## 2014-05-29 NOTE — Progress Notes (Signed)
[facility]OC[facility] FOR AM LAB REPLACEMENT ONLY  The patient does [facility] for the [facility] Electrolyte [facility] Protocol based on the criteria listed below:   1. Is GFR >/= 40 ml/min? Yes.    Patient's GFR today is >90 2. Is urine output >/= 0.5 ml/kg/hr for the last 6 hours? Yes.   Patient's UOP is .76 ml/kg/hr 3. Is BUN < 60 mg/dL? Yes.    Patient's BUN today is 43 4. Abnormal electrolyte  K 3.6 5. Ordered repletion with: per protocol 6. If a panic level lab has been reported, has the CCM MD in charge been notified? Yes.  .   Physician:  Rogelia BogaAlva  Benigno Check, Lang Snowlizabeth McEachran 05/29/2014 5:02 AM

## 2014-05-30 ENCOUNTER — Inpatient Hospital Stay (HOSPITAL_COMMUNITY): Payer: Commercial Managed Care - HMO

## 2014-05-30 LAB — GLUCOSE, CAPILLARY
GLUCOSE-CAPILLARY: 181 mg/dL — AB (ref 70–99)
GLUCOSE-CAPILLARY: 198 mg/dL — AB (ref 70–99)
GLUCOSE-CAPILLARY: 229 mg/dL — AB (ref 70–99)
GLUCOSE-CAPILLARY: 245 mg/dL — AB (ref 70–99)
Glucose-Capillary: 267 mg/dL — ABNORMAL HIGH (ref 70–99)
Glucose-Capillary: 275 mg/dL — ABNORMAL HIGH (ref 70–99)

## 2014-05-30 LAB — BASIC METABOLIC PANEL
Anion gap: 8 (ref 5–15)
BUN: 30 mg/dL — ABNORMAL HIGH (ref 6–23)
CALCIUM: 8.2 mg/dL — AB (ref 8.4–10.5)
CO2: 34 mmol/L — AB (ref 19–32)
Chloride: 105 mmol/L (ref 96–112)
Creatinine, Ser: 0.65 mg/dL (ref 0.50–1.10)
Glucose, Bld: 196 mg/dL — ABNORMAL HIGH (ref 70–99)
Potassium: 4 mmol/L (ref 3.5–5.1)
SODIUM: 147 mmol/L — AB (ref 135–145)

## 2014-05-30 LAB — TYPE AND SCREEN
ABO/RH(D): O POS
Antibody Screen: NEGATIVE
Unit division: 0

## 2014-05-30 LAB — CBC
HCT: 28.6 % — ABNORMAL LOW (ref 36.0–46.0)
Hemoglobin: 8.6 g/dL — ABNORMAL LOW (ref 12.0–15.0)
MCH: 26.3 pg (ref 26.0–34.0)
MCHC: 30.1 g/dL (ref 30.0–36.0)
MCV: 87.5 fL (ref 78.0–100.0)
PLATELETS: 278 10*3/uL (ref 150–400)
RBC: 3.27 MIL/uL — ABNORMAL LOW (ref 3.87–5.11)
RDW: 15.8 % — ABNORMAL HIGH (ref 11.5–15.5)
WBC: 23.4 10*3/uL — AB (ref 4.0–10.5)

## 2014-05-30 LAB — HEMOGLOBIN AND HEMATOCRIT, BLOOD
HEMATOCRIT: 28.8 % — AB (ref 36.0–46.0)
HEMOGLOBIN: 8.8 g/dL — AB (ref 12.0–15.0)

## 2014-05-30 LAB — MAGNESIUM: Magnesium: 2.1 mg/dL (ref 1.5–2.5)

## 2014-05-30 LAB — PHOSPHORUS: PHOSPHORUS: 3.5 mg/dL (ref 2.3–4.6)

## 2014-05-30 MED ORDER — VITAL AF 1.2 CAL PO LIQD
1000.0000 mL | ORAL | Status: DC
  Administered 2014-05-30 – 2014-06-02 (×4): 1000 mL
  Filled 2014-05-30 (×6): qty 1000

## 2014-05-30 MED ORDER — FENTANYL CITRATE 0.05 MG/ML IJ SOLN
25.0000 ug | INTRAMUSCULAR | Status: DC | PRN
  Administered 2014-05-30 – 2014-06-08 (×18): 100 ug via INTRAVENOUS
  Administered 2014-06-08 (×2): 50 ug via INTRAVENOUS
  Administered 2014-06-09: 100 ug via INTRAVENOUS
  Filled 2014-05-30 (×22): qty 2

## 2014-05-30 NOTE — Progress Notes (Signed)
PT Cancellation Note  Patient Details Name: Jackquline BoschLinda K Dezeeuw MRN: [id number] DOB: 1945-12-06   Cancelled Treatment:    Reason Eval/Treat Not Completed: Medical issues which prohibited therapy--pt back on vent and not very responsive at this time. Will check back another day.    Rebeca AlertJannie Keilan Nichol, MPT Pager: [phone]

## 2014-05-30 NOTE — Progress Notes (Signed)
NUTRITION FOLLOW-UP  DOCUMENTATION CODES Per approved criteria  -Not Applicable   INTERVENTION: - Continue Vital AF 1.2 decrease to new goal rate of 60 ml/hr via OGT.  Tube feeding regimen 1728 kcal (100% of needs), 108 grams of protein, and 1168 ml of H2O.   NUTRITION DIAGNOSIS: Inadequate oral intake related to inability to eat as evidenced by NPO; ongoing  Goal: Pt to meet >/= 90% of their estimated nutrition needs; met with TF.   Monitor:  Weight trend, initiation and toleration of TF, labs/CBGs, vent status/settings  69 y.o. female  Admitting Dx: H1N1, pneumonia  ASSESSMENT: 69 y.o. F brought to [facility] ED 3/15 with 4 day hx of generalized body aches, SOB, subjective fevers, chills. In ED, had fever to 103, was placed on NRB for hypoxia and had RR in high 30's. PCCM called for admission due to concern for potential of deterioration.   3/16: - Pt intubated 3/16 - Spoke with husband who reports that pt as eating well until she started to get sick about 2 weeks ago. No significant weight loss. Pt's usual body weight is 165 lbs.  - No signs of fat or muscle wasting.   3/18: - Pt with H1N1 and Pneumonia. Running low grade fever.  - Per RN, pt with 30 mL residuals. Having diarrhea. C Diff pending.  - TF running at goal rate of 65 mL/hr.  - CBGs elevated- Lantus increased per MD note. Followed by Diabetes Coordinator.  - Pt fluid overloaded (+8.7 L since admission.) Wt increased 12 lb from 3/17 to 3/18. Re-weighed during RD visit to confirm. On Lasix. Per RN, urine output increasing.   3/21: -Pt discussed in rounds, tolerating TF well  3/23: -Trach placed, failed to wean  3/25: -Pt discussed in rounds, tolerating TF well  Labs: CBGs elevated 114, 162, 146  Patient is currently intubated on ventilator support MV: 13.7 L/min Temp (24hrs), Avg:99 F (37.2 C), Min:98.4 F (36.9 C), Max:99.5 F (37.5 C)  Propofol: off  Labs reviewed  Height: Ht Readings from Last  1 Encounters:  05/29/14 '5\' 6"'  (1.676 m)   Weight: Wt Readings from Last 1 Encounters:  05/29/14 171 lb 8.3 oz (77.8 kg)   BMI:  Body mass index is 27.7 kg/(m^2).  Estimated Nutritional Needs: Kcal: 1715 Protein: 100-115 g Fluid: 2.0 L/day  Skin: two small skin tears on sacrum  Diet Order: Diet NPO time specified  EDUCATION NEEDS: -Education not appropriate at this time   Intake/Output Summary (Last 24 hours) at 05/30/14 0917 Last data filed at 05/30/14 0800  Gross per 24 hour  Intake 3272.72 ml  Output   3155 ml  Net 117.72 ml    Last BM: 3/24  Labs:   Recent Labs Lab 05/28/14 0400 05/28/14 1502 05/29/14 0314 05/30/14 0325  NA 147* 149* 147* 147*  K 3.5 3.6 3.6 4.0  CL 101 106 105 105  CO2 35* 33* 31 34*  BUN 46* 43* 43* 30*  CREATININE 0.79 0.73 0.76 0.65  CALCIUM 8.4 8.0* 8.1* 8.2*  MG 1.6  --  2.1 2.1  PHOS 3.4  --  3.5 3.5  GLUCOSE 237* 114* 162* 196*    CBG (last 3)   Recent Labs  05/29/14 2357 05/30/14 0331 05/30/14 0802  GLUCAP 229* 181* 198*    Scheduled Meds: . antiseptic oral rinse  7 mL Mouth Rinse QID  . aspirin  81 mg Oral Daily  . cefTRIAXone (ROCEPHIN)  IV  1 g Intravenous Q24H  .  chlorhexidine  15 mL Mouth Rinse BID  . insulin aspart  0-20 Units Subcutaneous 6 times per day  . insulin aspart  3 Units Subcutaneous 6 times per day  . insulin glargine  45 Units Subcutaneous Daily  . levothyroxine  150 mcg Per Tube QAC breakfast  . metoprolol tartrate  12.5 mg Oral BID  . midazolam  4 mg Intravenous Once  . pantoprazole sodium  40 mg Per Tube Q24H  . vancomycin  750 mg Intravenous BID  . vecuronium  10 mg Intravenous Once    Continuous Infusions: . feeding supplement (VITAL AF 1.2 CAL) 1,000 mL (05/30/14 0015)  . fentaNYL infusion INTRAVENOUS 50 mcg/hr (05/30/14 0730)  . norepinephrine (LEVOPHED) Adult infusion Stopped (05/25/14 0825)  . propofol Stopped (05/30/14 0730)    Past Medical History  Diagnosis Date  .  Diabetes mellitus   . Hypothyroidism   . Hypertension   . Hyperlipidemia     Past Surgical History  Procedure Laterality Date  . Back surgery      Elmer Picker MS Dietetic Intern Pager Number [phone]

## 2014-05-30 NOTE — Progress Notes (Signed)
Inpatient Diabetes Program Recommendations  AACE/ADA: New Consensus Statement on Inpatient Glycemic Control (2013)  Target Ranges:  Prepandial:   less than 140 mg/dL      Peak postprandial:   less than 180 mg/dL (1-2 hours)      Critically ill patients:  140 - 180 mg/dL    Results for Jackquline BoschRICH, Lilyannah K (MRN [id number]) as of 05/30/2014 08:24  Ref. Range 05/28/2014 23:27 05/29/2014 04:10 05/29/2014 08:01 05/29/2014 12:12 05/29/2014 16:10 05/29/2014 20:05  Glucose-Capillary Latest Range: 70-99 mg/dL 657155 (H) 846180 (H) 962229 (H) 239 (H) 206 (H) 201 (H)    Results for Jackquline BoschRICH, Phuong K (MRN [id number]) as of 05/30/2014 08:24  Ref. Range 05/29/2014 23:57 05/30/2014 03:31 05/30/2014 08:02  Glucose-Capillary Latest Range: 70-99 mg/dL 401229 (H) 027181 (H) 253198 (H)      Outpatient Diabetes medications: Lantus 40 units q AM and 60 units q HS  Janumet 50-500 mg one daily   Current orders for Inpatient glycemic control: Lantus 45 units daily  Novolog 3 units q 4 hours to cover tube feeds   Novolog Resistant SSI q 4 hours    **Note Lantus increased to 45 units daily- Patient will get increased dose today (got 40 units Lantus yesterday AM).    MD- Please consider increasing Novolog tube feed coverage to 4 units Q4 hours (currently ordered as 3 units Q4 hours)     Will follow Ambrose FinlandJeannine Johnston Katrianna Friesenhahn RN, MSN, CDE Diabetes Coordinator Inpatient Diabetes Program Team Pager: [phone] (8a-5p)

## 2014-05-30 NOTE — Progress Notes (Signed)
PULMONARY / CRITICAL CARE MEDICINE   Name: Grace Mitchell MRN: [id number] DOB: August 28, 1945    ADMISSION DATE:  05/20/2014 CONSULTATION DATE:  05/30/2014  REFERRING MD :  EDP  CHIEF COMPLAINT:  Body aches, SOB, chills, fever  INITIAL PRESENTATION:  69 yo female smoker with dyspnea, fever (Tm103F), chills, malaise from H1N1 influenza & pneumococcal PNA and sepsis.  STUDIES:   SIGNIFICANT EVENTS: 3/15 admit 3/16 ETT for resp failure 3/17 paralytic for worsening pO2/FIO2  SUBJECTIVE:   S/p trach on 3/24, tolerating ATC this am  Poorly responsive   VITAL SIGNS: Temp:  [98.4 F (36.9 C)-99.1 F (37.3 C)] 99 F (37.2 C) (03/25 1000) Pulse Rate:  [98-118] 108 (03/25 1000) Resp:  [14-35] 33 (03/25 1000) BP: (120-201)/(40-91) 178/70 mmHg (03/25 1000) SpO2:  [95 %-100 %] 95 % (03/25 1000) FiO2 (%):  [30 %-40 %] 30 % (03/25 1111) INTAKE / OUTPUT: Intake/Output      03/24 0701 - 03/25 0700 03/25 0701 - 03/26 0700   I.V. (mL/kg) 936 (12) 95.4 (1.2)   Blood 375    Other     NG/GT 1635 195   IV Piggyback 400 150   Total Intake(mL/kg) 3346 (43) 440.4 (5.7)   Urine (mL/kg/hr) 3305 (1.8)    Total Output 3305     Net +41 +440.4          PHYSICAL EXAMINATION: General: ill appearing woman, NAD Neuro: Arousable but with difficulty, following commands, moving all ext weakly. HEENT: Trach site unremakable Cardiovascular:  s1 s2 regular, no M Lungs: B insp rhonchi; tachypneic Abdomen: soft, non tender, no r/g, no r Musculoskeletal: mild edema Skin: no rashes  LABS:  CBC  Recent Labs Lab 05/28/14 0400 05/29/14 0314 05/29/14 2003 05/30/14 0325  WBC 22.1* 23.1*  --  23.4*  HGB 7.3* 6.6* 8.8* 8.6*  HCT 23.7* 21.5* 28.8* 28.6*  PLT 223 234  --  278   Coag's  Recent Labs Lab 05/28/14 0400  APTT 27  INR 1.03   BMET  Recent Labs Lab 05/28/14 1502 05/29/14 0314 05/30/14 0325  NA 149* 147* 147*  K 3.6 3.6 4.0  CL 106 105 105  CO2 33* 31 34*  BUN 43* 43* 30*   CREATININE 0.73 0.76 0.65  GLUCOSE 114* 162* 196*   Electrolytes  Recent Labs Lab 05/28/14 0400 05/28/14 1502 05/29/14 0314 05/30/14 0325  CALCIUM 8.4 8.0* 8.1* 8.2*  MG 1.6  --  2.1 2.1  PHOS 3.4  --  3.5 3.5   Sepsis Markers No results for input(s): LATICACIDVEN, PROCALCITON, O2SATVEN in the last 168 hours. ABG  Recent Labs Lab 05/26/14 1412 05/26/14 1620 05/27/14 0410  PHART 7.455* 7.451* 7.424  PCO2ART 46.4* 49.6* 51.7*  PO2ART 105.0* 96.1 66.6*   Liver Enzymes  Recent Labs Lab 05/25/14 0635  AST 47*  ALT 32  ALKPHOS 148*  BILITOT 0.8  ALBUMIN 1.4*   Cardiac Enzymes No results for input(s): TROPONINI, PROBNP in the last 168 hours. Glucose  Recent Labs Lab 05/29/14 1212 05/29/14 1610 05/29/14 2005 05/29/14 2357 05/30/14 0331 05/30/14 0802  GLUCAP 239* 206* 201* 229* 181* 198*    Imaging Dg Chest Port 1 View  05/30/2014   CLINICAL DATA:  Respiratory failure  EXAM: PORTABLE CHEST - 1 VIEW  COMPARISON:  05/29/2014  FINDINGS: Tracheostomy tube and left central venous line on again noted and stable. Nasogastric catheter extends into the stomach. Cardiac shadow is stable. Diffuse interstitial and alveolar changes are again noted. The overall  appearance given some technical variation in the film is stable. No sizable effusion is noted.  IMPRESSION: No change in bilateral infiltrates.  Tubes and lines as described.   Electronically Signed   By: Alcide CleverMark  Lukens M.D.   On: 05/30/2014 07:26   Dg Chest Port 1 View  05/29/2014   CLINICAL DATA:  Hypoxia/respiratory failure  EXAM: PORTABLE CHEST - 1 VIEW  COMPARISON:  May 28, 2014  FINDINGS: Tracheostomy tube tip is 6.2 cm above the carina. Central catheter tip is in the superior vena cava. Nasogastric tube tip and side port are below the diaphragm. No pneumothorax. There is interstitial and patchy alveolar edema bilaterally, stable. No new opacity. Heart is upper normal in size with pulmonary vascularity within normal  limits.  IMPRESSION: Moderate interstitial and patchy alveolar edema, stable. No new opacity. Tube and catheter positions as described without pneumothorax. No change in cardiac silhouette.   Electronically Signed   By: Bretta BangWilliam  Woodruff III M.D.   On: 05/29/2014 07:06   Dg Chest Port 1 View  05/28/2014   CLINICAL DATA:  69 year old female with respiratory failure. Subsequent encounter.  EXAM: PORTABLE CHEST - 1 VIEW  COMPARISON:  05/28/2014 4:54 a.m.  FINDINGS: Tracheostomy tube tip midline.  Left central line tip mid superior vena cava level.  Nasogastric tube courses below the diaphragm. Tip is not included on the present exam.  Diffuse slightly asymmetric airspace disease relatively similar to prior exam. This may represent pulmonary edema/diffuse infiltrates.  No gross pneumothorax.  Calcified aorta.  Heart size top-normal  IMPRESSION: No significant change in diffuse asymmetric airspace disease.   Electronically Signed   By: Lacy DuverneySteven  Olson M.D.   On: 05/28/2014 12:11   Dg Abd Portable 1v  05/28/2014   CLINICAL DATA:  Feeding tube placement.  EXAM: PORTABLE ABDOMEN - 1 VIEW  COMPARISON:  05/28/2014.  FINDINGS: The feeding tube has been retracted. The tip is in the antral pyloric region of the stomach. Otherwise the exam is unchanged. Wire is present over the pelvis, likely thermistor from Foley catheter.  IMPRESSION: Enteric tube tip in the antropyloric of the stomach.   Electronically Signed   By: Andreas NewportGeoffrey  Lamke M.D.   On: 05/28/2014 18:21   Dg Abd Portable 1v  05/28/2014   CLINICAL DATA:  NG tube replacement.  EXAM: PORTABLE ABDOMEN - 1 VIEW  COMPARISON:  None.  FINDINGS: The tip of the NG tube is in the distal stomach. The stomach is collapsed. The bowel gas pattern is normal.  IMPRESSION: The tip of the NG tube is in the distal stomach which is collapsed.   Electronically Signed   By: Marin Robertshristopher  Mattern M.D.   On: 05/28/2014 12:12   ASSESSMENT / PLAN:  PULMONARY ET 3/16>>3/24 Trach  3/24>> A: ARDS 2nd to H1N1 PNA .(resolved) Presumed pneumococcal PNA Tobacco abuse P:   ATC as tolerated, suspect she will require a prolonged vent wean. Will use PSV if she remains tachypneic on ATC Diuresis as tolerated Prn BD's  CARDIOVASCULAR A:  Sepsis 2nd to PNA. Hx HTN, HLD. P:  D/C levophed Cortisol adaquate   RENAL Lab Results  Component Value Date   CREATININE 0.65 05/30/2014   CREATININE 0.76 05/29/2014   CREATININE 0.73 05/28/2014    Intake/Output Summary (Last 24 hours) at 05/30/14 1120 Last data filed at 05/30/14 1000  Gross per 24 hour  Intake 2748.92 ml  Output   3055 ml  Net -306.08 ml    A:   AKI from sepsis >>  improved. hypoK P:   Monitor renal fx Lasix 40 mg hold for now due to low bp requiring fluid bolus Replace electrolytes as indicated. BMET in am  [facility] IVF  GASTROINTESTINAL A:   Nutrition. P:   TFs to goal Stools were excessive now improved  HEMATOLOGIC A:   Leukocytosis. Dilutional anemia Anemia requiring transfusion 3/24 P:  F/u CBC Lovenox, stop 3/24 since requiring transfusion will use SCD's for DVT protection. Transfuse for Hgb<7  INFECTIOUS A:   Pneumonia with pneumococcal Ag positive. 3/22 fever 102 re culture and start vanc  P:   Rocephin 3/15>>> Vanco 3/22(fever 102)>> 3/25 3/15 zithromax>>> 3/19  tamiflu 3/15 >>3/20  Pneumococcal Ag 3/15 >> positive Legionella Ag 3/15 >>neg Influenza PCR 3/15>> H1N1 pos c diff 3/18 >>neg  3/22 bc x 2>> 3/22 uc>>neg 3/22 sputum>>  D/c vanco 3/25 as all 3/22 cx negative so far On day 11 ceftriaxone, plan d/c 3/25 and follow  ENDOCRINE CBG (last 3)   Recent Labs  05/29/14 2357 05/30/14 0331 05/30/14 0802  GLUCAP 229* 181* 198*    A:   Hx of DM, hypothyroidism.r/o rel AI P:   Continue synthroid SSI CBG's q4hr Increased lantus to 45 on 3/24 TF coverage  NEUROLOGIC A:   Anxiety, insomnia, chronic pain. Severe ards, vent dyschrony SUSPECTED cip P:    PRN tylenol Hold outpatient amitriptyline, norco. Decrease sedation  PTconsult  Family - daughter updated bedside 3/25  Levy Pupa, MD, PhD 05/30/2014, 11:34 AM [facility] [phone] or if no answer [phone]

## 2014-05-31 LAB — BLOOD GAS, ARTERIAL
Acid-Base Excess: 9.9 mmol/L — ABNORMAL HIGH (ref 0.0–2.0)
BICARBONATE: 32.1 meq/L — AB (ref 20.0–24.0)
DRAWN BY: 257701
FIO2: 0.3 %
O2 SAT: 96.6 %
PATIENT TEMPERATURE: 101
PEEP/CPAP: 5 cmH2O
PH ART: 7.577 — AB (ref 7.350–7.450)
PO2 ART: 90.5 mmHg (ref 80.0–100.0)
Pressure control: 25 cmH2O
RATE: 26 resp/min
TCO2: 28.3 mmol/L (ref 0–100)
pCO2 arterial: 34.9 mmHg — ABNORMAL LOW (ref 35.0–45.0)

## 2014-05-31 LAB — GLUCOSE, CAPILLARY
GLUCOSE-CAPILLARY: 240 mg/dL — AB (ref 70–99)
Glucose-Capillary: 246 mg/dL — ABNORMAL HIGH (ref 70–99)
Glucose-Capillary: 251 mg/dL — ABNORMAL HIGH (ref 70–99)
Glucose-Capillary: 220 mg/dL — ABNORMAL HIGH (ref 70–99)
Glucose-Capillary: 263 mg/dL — ABNORMAL HIGH (ref 70–99)
Glucose-Capillary: 243 mg/dL — ABNORMAL HIGH (ref 70–99)

## 2014-05-31 MED ORDER — METOPROLOL TARTRATE 25 MG/10 ML ORAL SUSPENSION
25.0000 mg | Freq: Two times a day (BID) | ORAL | Status: DC
  Administered 2014-05-31 – 2014-06-12 (×23): 25 mg via ORAL
  Filled 2014-05-31 (×26): qty 10

## 2014-05-31 MED ORDER — ENOXAPARIN SODIUM 40 MG/0.4ML ~~LOC~~ SOLN
40.0000 mg | SUBCUTANEOUS | Status: DC
  Administered 2014-05-31 – 2014-06-13 (×14): 40 mg via SUBCUTANEOUS
  Filled 2014-05-31 (×14): qty 0.4

## 2014-05-31 MED ORDER — FUROSEMIDE 10 MG/ML IJ SOLN
60.0000 mg | Freq: Once | INTRAMUSCULAR | Status: AC
  Administered 2014-05-31: 60 mg via INTRAVENOUS

## 2014-05-31 MED ORDER — SIMVASTATIN 10 MG PO TABS: 20.0000 mg | ORAL_TABLET | Freq: Every evening | ORAL | Status: DC

## 2014-05-31 MED ORDER — MIDAZOLAM HCL 2 MG/2ML IJ SOLN
2.0000 mg | INTRAMUSCULAR | Status: DC | PRN
  Administered 2014-05-31 – 2014-06-03 (×4): 2 mg via INTRAVENOUS
  Filled 2014-05-31 (×3): qty 2

## 2014-05-31 MED ORDER — FUROSEMIDE 10 MG/ML IJ SOLN
INTRAMUSCULAR | Status: AC
  Filled 2014-05-31: qty 2

## 2014-05-31 MED ORDER — FUROSEMIDE 10 MG/ML IJ SOLN
INTRAMUSCULAR | Status: AC
  Filled 2014-05-31: qty 4

## 2014-05-31 MED ORDER — SIMVASTATIN 20 MG PO TABS
20.0000 mg | ORAL_TABLET | Freq: Every evening | ORAL | Status: DC
Start: 2014-05-31 — End: 2014-06-13
  Administered 2014-05-31 – 2014-06-12 (×13): 20 mg
  Filled 2014-05-31 (×4): qty 2
  Filled 2014-05-31: qty 1
  Filled 2014-05-31 (×5): qty 2
  Filled 2014-05-31: qty 1
  Filled 2014-05-31 (×3): qty 2

## 2014-05-31 MED ORDER — SODIUM CHLORIDE 0.9 % IV BOLUS (SEPSIS)
1000.0000 mL | Freq: Once | INTRAVENOUS | Status: AC
  Administered 2014-05-31: 1000 mL via INTRAVENOUS

## 2014-05-31 MED ORDER — MIDAZOLAM HCL 2 MG/2ML IJ SOLN
INTRAMUSCULAR | Status: AC
  Filled 2014-05-31: qty 2

## 2014-05-31 NOTE — Progress Notes (Signed)
PT Cancellation Note  Patient Details Name: Grace Mitchell MRN: [id number] DOB: 1945/08/10   Cancelled Treatment:    Reason Eval/Treat Not Completed: Medical issues which prohibited therapy--spoke with RN-pt not medically ready. Increased HR, RR and just sedated. Will  continue to check back and attempt eval if/when appropriate.    Rebeca AlertJannie Mistee Soliman, MPT Pager: [phone]

## 2014-05-31 NOTE — Progress Notes (Signed)
PULMONARY / CRITICAL CARE MEDICINE   Name: Grace Mitchell MRN: [id number] DOB: July 02, 1945    ADMISSION DATE:  05/20/2014 CONSULTATION DATE:  05/31/2014  REFERRING MD :  EDP  CHIEF COMPLAINT:  Body aches, SOB, chills, fever  INITIAL PRESENTATION:  69 yo female smoker with dyspnea, fever (Tm103F), chills, malaise from H1N1 influenza & pneumococcal PNA and sepsis.  STUDIES:   SIGNIFICANT EVENTS: 3/15 admit 3/16 ETT for resp failure 3/17 paralytic for worsening pO2/FIO2 3/26 change to pressure control  SUBJECTIVE:   Increased BD, HR, and respiratory rate today.  VITAL SIGNS: Temp:  [99.3 F (37.4 C)-101.3 F (38.5 C)] 100.6 F (38.1 C) (03/26 0600) Pulse Rate:  [108-128] 128 (03/26 0925) Resp:  [8-38] 30 (03/26 0754) BP: (151-191)/(51-93) 162/51 mmHg (03/26 0925) SpO2:  [91 %-100 %] 98 % (03/26 0754) FiO2 (%):  [30 %-40 %] 30 % (03/26 0835) Weight:  [169 lb 5 oz (76.8 kg)] 169 lb 5 oz (76.8 kg) (03/26 0500) INTAKE / OUTPUT: Intake/Output      03/25 0701 - 03/26 0700 03/26 0701 - 03/27 0700   I.V. (mL/kg) 296.2 (3.9)    Blood     NG/GT 1473    IV Piggyback 150    Total Intake(mL/kg) 1919.2 (25)    Urine (mL/kg/hr) 2110 (1.1)    Total Output 2110     Net -190.8            PHYSICAL EXAMINATION: General: ill appearing Neuro: follows commands HEENT: Trach site unremakable Cardiovascular: regular, tachycardic Lungs: b/l faint crackles Abdomen: soft, non tender Musculoskeletal: 2+ edema Skin: no rashes  LABS:  CBC  Recent Labs Lab 05/28/14 0400 05/29/14 0314 05/29/14 2003 05/30/14 0325  WBC 22.1* 23.1*  --  23.4*  HGB 7.3* 6.6* 8.8* 8.6*  HCT 23.7* 21.5* 28.8* 28.6*  PLT 223 234  --  278   Coag's  Recent Labs Lab 05/28/14 0400  APTT 27  INR 1.03   BMET  Recent Labs Lab 05/28/14 1502 05/29/14 0314 05/30/14 0325  NA 149* 147* 147*  K 3.6 3.6 4.0  CL 106 105 105  CO2 33* 31 34*  BUN 43* 43* 30*  CREATININE 0.73 0.76 0.65  GLUCOSE 114*  162* 196*   Electrolytes  Recent Labs Lab 05/28/14 0400 05/28/14 1502 05/29/14 0314 05/30/14 0325  CALCIUM 8.4 8.0* 8.1* 8.2*  MG 1.6  --  2.1 2.1  PHOS 3.4  --  3.5 3.5   ABG  Recent Labs Lab 05/26/14 1412 05/26/14 1620 05/27/14 0410  PHART 7.455* 7.451* 7.424  PCO2ART 46.4* 49.6* 51.7*  PO2ART 105.0* 96.1 66.6*   Liver Enzymes  Recent Labs Lab 05/25/14 0635  AST 47*  ALT 32  ALKPHOS 148*  BILITOT 0.8  ALBUMIN 1.4*   Glucose  Recent Labs Lab 05/30/14 0802 05/30/14 1133 05/30/14 1552 05/30/14 2003 05/31/14 0044 05/31/14 0412  GLUCAP 198* 267* 275* 245* 240* 246*    Imaging Dg Chest Port 1 View  05/30/2014   CLINICAL DATA:  Respiratory failure  EXAM: PORTABLE CHEST - 1 VIEW  COMPARISON:  05/29/2014  FINDINGS: Tracheostomy tube and left central venous line on again noted and stable. Nasogastric catheter extends into the stomach. Cardiac shadow is stable. Diffuse interstitial and alveolar changes are again noted. The overall appearance given some technical variation in the film is stable. No sizable effusion is noted.  IMPRESSION: No change in bilateral infiltrates.  Tubes and lines as described.   Electronically Signed   By: Loraine LericheMark  Lukens M.D.   On: 05/30/2014 07:26   ASSESSMENT / PLAN:  PULMONARY ET 3/16>>3/24 Trach 3/24>> A: Acute respiratory failure with ARDS 2nd to H1N1 and Pneumococcal PNA . Failure to wean from vent s/p trach. Tobacco abuse. P:   Change to pressure control Wean to trach collar as tolerated >> volume status and weakness seem to be issues preventing weaning  CARDIOVASCULAR A:  Sepsis 2nd to PNA >> resolved. Hx HTN, HLD. P:  ASA, zocor Increase lopressor 3/26  RENAL A:   AKI from sepsis >> resolved. P:   Monitor renal fx, urine outpt  GASTROINTESTINAL A:   Nutrition. P:   TFs to goal Protonix for SUP  HEMATOLOGIC A:   Anemia of critical illness. P:  F/u CBC Resume lovenox 3/26  INFECTIOUS A:    H1N1/Pneumococcal pneumonia >> completed Abx 3/25. P:   Monitor clinically  Pneumococcal Ag 3/15 >> positive Influenza PCR 3/15>> H1N1 pos Blood 3/22 >>  ENDOCRINE A:   Hx of DM, hypothyroidism. P:   Continue synthroid SSI Increased lantus to 45 on 3/24 TF coverage  NEUROLOGIC A:   Anxiety, insomnia, chronic pain. 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 P:   PT PRN fentanyl, versed  Updated pt's family at bedside  CC time 35 minutes.  Likely would be good candidate for LTAC placement.  Coralyn Helling, MD [facility] Pulmonary/Critical Care 05/31/2014, 11:06 AM Pager:  [phone] After 3pm call: [phone]

## 2014-05-31 NOTE — Progress Notes (Signed)
Wasted in sink (130ml) of Fentanyl 2,500 mcg/250 ml IV drip with C. Lavell LusterAyers, Charity fundraiserN.

## 2014-05-31 NOTE — Progress Notes (Signed)
Pt tolerated only 30 mins of 0.40 ATC due to RR = 54.  Attempted Cpap/PS 20/5 with no reduction in RR. Returned Pt to full vent support.

## 2014-05-31 NOTE — Progress Notes (Signed)
Brief Pharmacy note: Lovenox for VTE prophylaxis  Lovenox 40mg  daily, Wt 76.8 kg, renal function wnl  CBC wnl  Thank you,   Otho BellowsGreen, Tamika Nou L PharmD Pager [phone] 05/31/2014, 11:28 AM

## 2014-05-31 NOTE — Progress Notes (Signed)
eLink Physician-Brief Progress Note Patient Name: Grace Mitchell DOB: September 10, 1945 MRN: [id number]   Date of Service  05/31/2014  HPI/Events of Note  Called with multiple issues: 1. Temperature = 103 and 2. HR = 140. Patient received Lasix earlier today with good diuresis prior to fever. Suspect vasodilation due to fever and relative volume depletion due to diuresis.  eICU Interventions  Will order: 1. Ice packs to axilla and groin. 2. 0.9 NaCl 1 liter IV over 1 hour now.      Intervention Category Intermediate Interventions: Arrhythmia - evaluation and management  Briggette Najarian Eugene 05/31/2014, 5:45 PM

## 2014-06-01 ENCOUNTER — Inpatient Hospital Stay (HOSPITAL_COMMUNITY): Payer: Commercial Managed Care - HMO

## 2014-06-01 DIAGNOSIS — G6281 Critical illness polyneuropathy: Secondary | ICD-10-CM

## 2014-06-01 DIAGNOSIS — R509 Fever, unspecified: Secondary | ICD-10-CM

## 2014-06-01 LAB — BASIC METABOLIC PANEL
Anion gap: 9 (ref 5–15)
BUN: 26 mg/dL — AB (ref 6–23)
CALCIUM: 8.6 mg/dL (ref 8.4–10.5)
CO2: 30 mmol/L (ref 19–32)
CREATININE: 0.72 mg/dL (ref 0.50–1.10)
Chloride: 110 mmol/L (ref 96–112)
GFR calc Af Amer: >90 mL/min (ref >90)
GFR, EST NON AFRICAN AMERICAN: 88 mL/min — AB (ref >90)
Glucose, Bld: 213 mg/dL — ABNORMAL HIGH (ref 70–99)
POTASSIUM: 3.3 mmol/L — AB (ref 3.5–5.1)
Sodium: 149 mmol/L — ABNORMAL HIGH (ref 135–145)

## 2014-06-01 LAB — CBC
HEMATOCRIT: 27.9 % — AB (ref 36.0–46.0)
HEMOGLOBIN: 8.2 g/dL — AB (ref 12.0–15.0)
MCH: 26.4 pg (ref 26.0–34.0)
MCHC: 29.4 g/dL — ABNORMAL LOW (ref 30.0–36.0)
MCV: 89.7 fL (ref 78.0–100.0)
Platelets: 364 10*3/uL (ref 150–400)
RBC: 3.11 MIL/uL — ABNORMAL LOW (ref 3.87–5.11)
RDW: 18.4 % — ABNORMAL HIGH (ref 11.5–15.5)
WBC: 23.6 10*3/uL — ABNORMAL HIGH (ref 4.0–10.5)

## 2014-06-01 LAB — GLUCOSE, CAPILLARY
GLUCOSE-CAPILLARY: 241 mg/dL — AB (ref 70–99)
GLUCOSE-CAPILLARY: 210 mg/dL — AB (ref 70–99)
Glucose-Capillary: 191 mg/dL — ABNORMAL HIGH (ref 70–99)
Glucose-Capillary: 240 mg/dL — ABNORMAL HIGH (ref 70–99)
Glucose-Capillary: 160 mg/dL — ABNORMAL HIGH (ref 70–99)
Glucose-Capillary: 209 mg/dL — ABNORMAL HIGH (ref 70–99)

## 2014-06-01 MED ORDER — SODIUM CHLORIDE 0.9 % IJ SOLN: 10.0000 mL | INTRAMUSCULAR | Status: DC | PRN

## 2014-06-01 MED ORDER — POTASSIUM CHLORIDE 20 MEQ/15ML (10%) PO SOLN
20.0000 meq | ORAL | Status: AC
  Administered 2014-06-01 (×2): 20 meq
  Filled 2014-06-01 (×2): qty 15

## 2014-06-01 MED ORDER — FREE WATER
150.0000 mL | Freq: Four times a day (QID) | Status: DC
Start: 2014-06-01 — End: 2014-06-03
  Administered 2014-06-01 – 2014-06-03 (×8): 150 mL

## 2014-06-01 MED ORDER — SODIUM CHLORIDE 0.9 % IJ SOLN
10.0000 mL | Freq: Two times a day (BID) | INTRAMUSCULAR | Status: DC
  Administered 2014-06-01 – 2014-06-13 (×20): 10 mL

## 2014-06-01 MED ORDER — FUROSEMIDE 10 MG/ML IJ SOLN
40.0000 mg | Freq: Once | INTRAMUSCULAR | Status: AC
  Administered 2014-06-01: 40 mg via INTRAVENOUS
  Filled 2014-06-01: qty 4

## 2014-06-01 NOTE — Progress Notes (Signed)
Per MD, wants PICC line placed ASAP, prior to the CVL being removed.  PICC RN notified.

## 2014-06-01 NOTE — Progress Notes (Signed)
[facility] FOR AM LAB REPLACEMENT ONLY  The patient does apply [facility] the [facility] Protocol based on the criteria listed below:   1. Is GFR >/= 40 ml/min? Yes.    Patient's GFR today is >90 2. Is urine output >/= 0.5 ml/kg/hr for the last 6 hours? Yes.   Patient's UOP is 1.13 ml/kg/hr 3. Is BUN < 60 mg/dL? Yes.    Patient's BUN today is 26 4. Abnormal electrolyte(s): Potassium 5. Ordered repletion with: Potassium per protocol    Grace Mitchell P 06/01/2014 5:40 AM

## 2014-06-01 NOTE — Progress Notes (Signed)
Peripherally Inserted Central Catheter/Midline Placement  The IV Nurse has discussed with the patient and/or persons authorized to consent for the patient, the purpose of this procedure and the potential benefits and risks involved with this procedure.  The benefits include less needle sticks, lab draws from the catheter and patient may be discharged home with the catheter.  Risks include, but not limited to, infection, bleeding, blood clot (thrombus formation), and puncture of an artery; nerve damage and irregular heat beat.  Alternatives to this procedure were also discussed.Husband at bedside. Consented to procedure. Pt. Nods head in agreement.  PICC/Midline Placement Documentation  PICC / Midline Single Lumen 06/01/14 PICC Right Basilic 37 cm 0 cm (Active)  Site Assessment Clean;Dry;Intact 06/01/2014  6:48 PM  Line Status Flushed;Saline locked;Blood return noted 06/01/2014  6:48 PM  Dressing Type Transparent 06/01/2014  6:48 PM  Dressing Status Clean;Dry;Intact;Antimicrobial disc in place 06/01/2014  6:48 PM  Dressing Change Due 06/08/14 06/01/2014  6:48 PM       Brigid Reoberts, Velda Wendt V 06/01/2014, 6:50 PM

## 2014-06-01 NOTE — Progress Notes (Signed)
PULMONARY / CRITICAL CARE MEDICINE   Name: Grace Mitchell MRN: [id number] DOB: May 26, 1945    ADMISSION DATE:  05/20/2014 CONSULTATION DATE:  06/01/2014  REFERRING MD :  EDP  CHIEF COMPLAINT:  Body aches, SOB, chills, fever  INITIAL PRESENTATION:  69 yo female smoker with dyspnea, fever (Tm103F), chills, malaise from H1N1 influenza & pneumococcal PNA and sepsis.  STUDIES:   SIGNIFICANT EVENTS: 3/15 admit 3/16 ETT for resp failure 3/17 paralytic for worsening pO2/FIO2 3/26 change to pressure control 3/27 Fever >> change line  SUBJECTIVE:   Tolerating Pressure control better than PRVC.  Denies chest/abd pain.  More interactive.  Fever over night.  VITAL SIGNS: Temp:  [100.8 F (38.2 C)-102.7 F (39.3 C)] 101.5 F (38.6 C) (03/27 0700) Pulse Rate:  [105-140] 111 (03/27 0752) Resp:  [21-37] 26 (03/27 0752) BP: (135-207)/(49-86) 164/64 mmHg (03/27 0752) SpO2:  [93 %-100 %] 99 % (03/27 0700) FiO2 (%):  [30 %] 30 % (03/27 0752) Weight:  [169 lb 5 oz (76.8 kg)] 169 lb 5 oz (76.8 kg) (03/27 0440) INTAKE / OUTPUT: Intake/Output      03/26 0701 - 03/27 0700 03/27 0701 - 03/28 0700   I.V. (mL/kg) 240 (3.1)    NG/GT 1615    IV Piggyback     Total Intake(mL/kg) 1855 (24.2)    Urine (mL/kg/hr) 2200 (1.2)    Stool 0 (0)    Total Output 2200     Net -345          Stool Occurrence 1 x      PHYSICAL EXAMINATION: General: no distress Neuro: follows commands, diffusely weak HEENT: Trach site unremakable Cardiovascular: regular, tachycardic Lungs: b/l faint crackles Abdomen: soft, non tender Musculoskeletal: 1+ edema Skin: no rashes  LABS:  CBC  Recent Labs Lab 05/29/14 0314 05/29/14 2003 05/30/14 0325 06/01/14 0445  WBC 23.1*  --  23.4* 23.6*  HGB 6.6* 8.8* 8.6* 8.2*  HCT 21.5* 28.8* 28.6* 27.9*  PLT 234  --  278 364   Coag's  Recent Labs Lab 05/28/14 0400  APTT 27  INR 1.03   BMET  Recent Labs Lab 05/29/14 0314 05/30/14 0325 06/01/14 0445  NA 147*  147* 149*  K 3.6 4.0 3.3*  CL 105 105 110  CO2 31 34* 30  BUN 43* 30* 26*  CREATININE 0.76 0.65 0.72  GLUCOSE 162* 196* 213*   Electrolytes  Recent Labs Lab 05/28/14 0400  05/29/14 0314 05/30/14 0325 06/01/14 0445  CALCIUM 8.4  < > 8.1* 8.2* 8.6  MG 1.6  --  2.1 2.1  --   PHOS 3.4  --  3.5 3.5  --   < > = values in this interval not displayed. ABG  Recent Labs Lab 05/26/14 1620 05/27/14 0410 05/31/14 1610  PHART 7.451* 7.424 7.577*  PCO2ART 49.6* 51.7* 34.9*  PO2ART 96.1 66.6* 90.5   Liver Enzymes No results for input(s): AST, ALT, ALKPHOS, BILITOT, ALBUMIN in the last 168 hours.   Glucose  Recent Labs Lab 05/31/14 1259 05/31/14 1636 05/31/14 2034 06/01/14 0101 06/01/14 0455 06/01/14 0808  GLUCAP 220* 263* 243* 241* 210* 191*    Imaging Dg Chest Port 1 View  06/01/2014   CLINICAL DATA:  ARDS  EXAM: PORTABLE CHEST - 1 VIEW  COMPARISON:  05/30/2014  FINDINGS: Tracheostomy is appropriately positioned. Left IJ central line tip terminates over the distal SVC. Nasogastric tube tip terminates below the level of the hemidiaphragms but is not included in the field of view. Heart  size is normal. Improved lung aeration bilaterally is noted with diffuse pulmonary airspace opacities most prominent at the bases, right greater than left. No pleural effusion. No acute osseous abnormality.  IMPRESSION: Improved pulmonary aeration which may indicate improving ARDS or other diffuse alveolar filling process.   Electronically Signed   By: Christiana PellantGretchen  Green M.D.   On: 06/01/2014 07:18   ASSESSMENT / PLAN:  PULMONARY ET 3/16>>3/24 Trach 3/24>> A: Acute respiratory failure with ARDS 2nd to H1N1 and Pneumococcal PNA . Failure to wean from vent s/p trach. Tobacco abuse. P:   Changed to pressure control 3/26 >> decrease PC to 22 and RR to 20 on 3/27 Wean to trach collar as tolerated >> volume status and weakness seem to be issues preventing weaning F/u CXR, ABG  CARDIOVASCULAR Lt  IJ CVL 3/16 >> A:  Sepsis 2nd to PNA >> resolved. Hx HTN, HLD. P:  ASA, zocor Increased lopressor 3/26  RENAL A:   AKI from sepsis >> resolved. Hypernatremia. Hypervolemia. Hyponatremia. P:   Lasix 40 mg IV x one 3/27 Add free water 3/27 Replace electrolytes as needed  GASTROINTESTINAL A:   Nutrition. P:   TFs to goal Protonix for SUP  HEMATOLOGIC A:   Anemia of critical illness. P:  F/u CBC Resumed lovenox 3/26  INFECTIOUS A:   H1N1/Pneumococcal pneumonia >> completed Abx 3/25. New Fever 3/27. P:   Have PICC line placed and d/c Lt IJ CVL If fever recurs, then repeat cx and consider restarting Abx  Pneumococcal Ag 3/15 >> positive Influenza PCR 3/15>> H1N1 pos Blood 3/22 >>  ENDOCRINE A:   Hx of DM, hypothyroidism. P:   Continue synthroid SSI Increased lantus to 45 on 3/24 TF coverage  NEUROLOGIC A:   Anxiety, insomnia, chronic pain. 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 P:   PT PRN fentanyl, versed  Updated pt's family at bedside  CC time 35 minutes.  Likely would be good candidate for LTAC placement.  Coralyn HellingVineet Jany Buckwalter, MD [facility] 06/01/2014, 8:40 AM Pager:  [phone] After 3pm call: [phone]

## 2014-06-01 NOTE — Progress Notes (Signed)
PT Cancellation Note  Patient Details Name: Jackquline BoschLinda K Wassel MRN: [id number] DOB: December 17, 1945   Cancelled Treatment:    Reason Eval/Treat Not Completed: Medical issues which prohibited therapy (not yet per RN, still on vent, plans to attempt to wean to T-collar today) Will  Check back tomorrow   [facility] LLCWILLIAMS,Cleaven Demario 06/01/2014, 12:32 PM

## 2014-06-02 ENCOUNTER — Inpatient Hospital Stay (HOSPITAL_COMMUNITY): Payer: Commercial Managed Care - HMO

## 2014-06-02 DIAGNOSIS — J9620 Acute and chronic respiratory failure, unspecified whether with hypoxia or hypercapnia: Secondary | ICD-10-CM

## 2014-06-02 DIAGNOSIS — Z930 Tracheostomy status: Secondary | ICD-10-CM

## 2014-06-02 DIAGNOSIS — R21 Rash and other nonspecific skin eruption: Secondary | ICD-10-CM

## 2014-06-02 DIAGNOSIS — J9621 Acute and chronic respiratory failure with hypoxia: Secondary | ICD-10-CM

## 2014-06-02 DIAGNOSIS — G6281 Critical illness polyneuropathy: Secondary | ICD-10-CM

## 2014-06-02 LAB — BASIC METABOLIC PANEL
ANION GAP: 9 (ref 5–15)
BUN: 29 mg/dL — ABNORMAL HIGH (ref 6–23)
CO2: 30 mmol/L (ref 19–32)
Calcium: 8.2 mg/dL — ABNORMAL LOW (ref 8.4–10.5)
Chloride: 109 mmol/L (ref 96–112)
Creatinine, Ser: 0.69 mg/dL (ref 0.50–1.10)
GFR calc non Af Amer: 89 mL/min — ABNORMAL LOW (ref >90)
Glucose, Bld: 220 mg/dL — ABNORMAL HIGH (ref 70–99)
POTASSIUM: 3.7 mmol/L (ref 3.5–5.1)
SODIUM: 148 mmol/L — AB (ref 135–145)

## 2014-06-02 LAB — BLOOD GAS, ARTERIAL
ACID-BASE EXCESS: 6.6 mmol/L — AB (ref 0.0–2.0)
BICARBONATE: 29.7 meq/L — AB (ref 20.0–24.0)
Drawn by: 235321
FIO2: 0.3 %
O2 Saturation: 98.1 %
PCO2 ART: 38.6 mmHg (ref 35.0–45.0)
PEEP: 5 cmH2O
PO2 ART: 114 mmHg — AB (ref 80.0–100.0)
Patient temperature: 100.8
Pressure control: 22 cmH2O
RATE: 20 resp/min
TCO2: 27.6 mmol/L (ref 0–100)
pH, Arterial: 7.503 — ABNORMAL HIGH (ref 7.350–7.450)

## 2014-06-02 LAB — GLUCOSE, CAPILLARY
GLUCOSE-CAPILLARY: 167 mg/dL — AB (ref 70–99)
GLUCOSE-CAPILLARY: 197 mg/dL — AB (ref 70–99)
GLUCOSE-CAPILLARY: 213 mg/dL — AB (ref 70–99)
Glucose-Capillary: 225 mg/dL — ABNORMAL HIGH (ref 70–99)
Glucose-Capillary: 204 mg/dL — ABNORMAL HIGH (ref 70–99)
Glucose-Capillary: 237 mg/dL — ABNORMAL HIGH (ref 70–99)

## 2014-06-02 LAB — CBC
HEMATOCRIT: 28.4 % — AB (ref 36.0–46.0)
Hemoglobin: 8.2 g/dL — ABNORMAL LOW (ref 12.0–15.0)
MCH: 26.5 pg (ref 26.0–34.0)
MCHC: 28.9 g/dL — AB (ref 30.0–36.0)
MCV: 91.6 fL (ref 78.0–100.0)
PLATELETS: 410 10*3/uL — AB (ref 150–400)
RBC: 3.1 MIL/uL — ABNORMAL LOW (ref 3.87–5.11)
RDW: 18.9 % — ABNORMAL HIGH (ref 11.5–15.5)
WBC: 19.7 10*3/uL — ABNORMAL HIGH (ref 4.0–10.5)

## 2014-06-02 LAB — CULTURE, BLOOD (ROUTINE X 2)
CULTURE: NO GROWTH
CULTURE: NO GROWTH

## 2014-06-02 LAB — URINALYSIS, ROUTINE W REFLEX MICROSCOPIC
Bilirubin Urine: NEGATIVE
GLUCOSE, UA: NEGATIVE mg/dL
HGB URINE DIPSTICK: NEGATIVE
Ketones, ur: NEGATIVE mg/dL
Leukocytes, UA: NEGATIVE
Nitrite: NEGATIVE
Protein, ur: NEGATIVE mg/dL
SPECIFIC GRAVITY, URINE: 1.025 (ref 1.005–1.030)
Urobilinogen, UA: 0.2 mg/dL (ref 0.0–1.0)
pH: 6.5 (ref 5.0–8.0)

## 2014-06-02 LAB — SEDIMENTATION RATE: SED RATE: 107 mm/h — AB (ref 0–22)

## 2014-06-02 MED ORDER — LIP MEDEX EX OINT
TOPICAL_OINTMENT | CUTANEOUS | Status: AC
  Administered 2014-06-02: 1
  Filled 2014-06-02: qty 7

## 2014-06-02 MED ORDER — FUROSEMIDE 10 MG/ML IJ SOLN
40.0000 mg | Freq: Once | INTRAMUSCULAR | Status: AC
  Administered 2014-06-02: 40 mg via INTRAVENOUS
  Filled 2014-06-02: qty 4

## 2014-06-02 MED ORDER — POTASSIUM CHLORIDE 20 MEQ/15ML (10%) PO SOLN
20.0000 meq | Freq: Once | ORAL | Status: AC
  Administered 2014-06-02: 20 meq
  Filled 2014-06-02: qty 15

## 2014-06-02 MED ORDER — INSULIN GLARGINE 100 UNIT/ML ~~LOC~~ SOLN
50.0000 [IU] | Freq: Every day | SUBCUTANEOUS | Status: DC
  Administered 2014-06-02: 50 [IU] via SUBCUTANEOUS
  Filled 2014-06-02 (×2): qty 0.5

## 2014-06-02 MED ORDER — POTASSIUM CHLORIDE 20 MEQ/15ML (10%) PO SOLN
20.0000 meq | ORAL | Status: AC
Start: 2014-06-02 — End: 2014-06-02
  Administered 2014-06-02 (×2): 20 meq
  Filled 2014-06-02 (×2): qty 15

## 2014-06-02 MED ORDER — VITAL AF 1.2 CAL PO LIQD
1000.0000 mL | ORAL | Status: DC
  Administered 2014-06-02 – 2014-06-09 (×10): 1000 mL
  Filled 2014-06-02 (×18): qty 1000

## 2014-06-02 NOTE — Progress Notes (Signed)
PULMONARY / CRITICAL CARE MEDICINE   Name: Grace Mitchell MRN: [id number] DOB: Apr 26, 1945    ADMISSION DATE:  05/20/2014 CONSULTATION DATE:  06/02/2014  REFERRING MD :  EDP  CHIEF COMPLAINT:  Body aches, SOB, chills, fever  INITIAL PRESENTATION:  69 y/o female smoker with dyspnea, fever (Tm103F), chills, malaise from H1N1 influenza & pneumococcal PNA and sepsis.  STUDIES:   SIGNIFICANT EVENTS: 3/15 admit 3/16 ETT for resp failure 3/17 paralytic for worsening pO2/FIO2 3/26 change to pressure control 3/27 Fever >> change line  SUBJECTIVE:   Tolerating PCV, failed PSV 10/5 with tachypnea, low MV.  Tmax 101.5   VITAL SIGNS: Temp:  [100.6 F (38.1 C)-101.5 F (38.6 C)] 101.5 F (38.6 C) (03/28 0800) Pulse Rate:  [97-115] 107 (03/28 0312) Resp:  [16-30] 20 (03/28 0900) BP: (100-176)/(40-81) 138/50 mmHg (03/28 0900) SpO2:  [98 %-100 %] 100 % (03/28 0900) FiO2 (%):  [30 %] 30 % (03/28 0900) Weight:  [167 lb 15.9 oz (76.2 kg)] 167 lb 15.9 oz (76.2 kg) (03/28 0500)   INTAKE / OUTPUT: Intake/Output      03/27 0701 - 03/28 0700 03/28 0701 - 03/29 0700   I.V. (mL/kg) 240 (3.1) 20 (0.3)   NG/GT 2195 120   Total Intake(mL/kg) 2435 (32) 140 (1.8)   Urine (mL/kg/hr) 1955 (1.1) 150 (0.6)   Stool 0 (0)    Total Output 1955 150   Net +480 -10        Stool Occurrence 1 x      PHYSICAL EXAMINATION: General: ill appearing female in no distress Neuro: follows commands, diffusely weak, no spontaneous movement of ext's noted HEENT: Trach site c/d/i Cardiovascular: regular, tachycardic Lungs: even/non-labored, lungs bilaterally with coarse rhonchi Abdomen: soft, non tender Musculoskeletal: 1+ edema Skin: no rashes  LABS:  CBC  Recent Labs Lab 05/30/14 0325 06/01/14 0445 06/02/14 0520  WBC 23.4* 23.6* 19.7*  HGB 8.6* 8.2* 8.2*  HCT 28.6* 27.9* 28.4*  PLT 278 364 410*   Coag's  Recent Labs Lab 05/28/14 0400  APTT 27  INR 1.03   BMET  Recent Labs Lab  05/30/14 0325 06/01/14 0445 06/02/14 0520  NA 147* 149* 148*  K 4.0 3.3* 3.7  CL 105 110 109  CO2 34* 30 30  BUN 30* 26* 29*  CREATININE 0.65 0.72 0.69  GLUCOSE 196* 213* 220*   Electrolytes  Recent Labs Lab 05/28/14 0400  05/29/14 0314 05/30/14 0325 06/01/14 0445 06/02/14 0520  CALCIUM 8.4  < > 8.1* 8.2* 8.6 8.2*  MG 1.6  --  2.1 2.1  --   --   PHOS 3.4  --  3.5 3.5  --   --   < > = values in this interval not displayed. ABG  Recent Labs Lab 05/27/14 0410 05/31/14 1610 06/02/14 0435  PHART 7.424 7.577* 7.503*  PCO2ART 51.7* 34.9* 38.6  PO2ART 66.6* 90.5 114.0*   Liver Enzymes No results for input(s): AST, ALT, ALKPHOS, BILITOT, ALBUMIN in the last 168 hours.   Glucose  Recent Labs Lab 06/01/14 1203 06/01/14 1639 06/01/14 2026 06/02/14 0002 06/02/14 0446 06/02/14 0740  GLUCAP 240* 160* 209* 197* 225* 204*    Imaging Dg Chest Port 1 View  06/02/2014   CLINICAL DATA:  ARDS.  EXAM: PORTABLE CHEST - 1 VIEW  COMPARISON:  06/01/2014 .  FINDINGS: Interim removal of left IJ line. Interim placement of right PICC line. PICC line tip is at the cavoatrial junction. Tracheostomy tube and NG tube in stable position.  Diffuse unchanged bilateral pulmonary infiltrates. No pleural effusion or pneumothorax. Heart size and pulmonary vascularity stable. No acute osseous abnormality.  IMPRESSION: 1. Interim removal of left IJ line. Interim placement of right PICC line, its tip is at the cavoatrial junction. Tracheostomy tube and NG tube in stable position. 2. Unchanged diffuse bilateral pulmonary infiltrates. ARDS/bilateral pneumonia could present in this fashion.   Electronically Signed   By: Maisie Fushomas  Register   On: 06/02/2014 07:16   Dg Chest Port 1 View  06/01/2014   CLINICAL DATA:  ARDS  EXAM: PORTABLE CHEST - 1 VIEW  COMPARISON:  05/30/2014  FINDINGS: Tracheostomy is appropriately positioned. Left IJ central line tip terminates over the distal SVC. Nasogastric tube tip  terminates below the level of the hemidiaphragms but is not included in the field of view. Heart size is normal. Improved lung aeration bilaterally is noted with diffuse pulmonary airspace opacities most prominent at the bases, right greater than left. No pleural effusion. No acute osseous abnormality.  IMPRESSION: Improved pulmonary aeration which may indicate improving ARDS or other diffuse alveolar filling process.   Electronically Signed   By: Christiana PellantGretchen  Green M.D.   On: 06/01/2014 07:18   ASSESSMENT / PLAN:  PULMONARY ET 3/16>>3/24 Trach 3/24>> A: Acute respiratory failure with ARDS 2nd to H1N1 and Pneumococcal PNA . Failure to wean from vent s/p trach. Tobacco abuse. P:   Changed to pressure control 3/26 >> decrease PC to 22 and RR to 20 on 3/27 Wean to trach collar as tolerated >> volume status and weakness seem to be issues preventing weaning F/u CXR, ABG  CARDIOVASCULAR Lt IJ CVL 3/16 >> 3/27 RUE PICC 3/27 >>  A:  Sepsis 2nd to PNA >> resolved. Tachycardia - suspect fever related  Hx HTN, HLD. P:  ASA, zocor Lopressor 25 BID  RENAL A:   AKI from sepsis >> resolved. Hypernatremia. Hypervolemia. Hyponatremia. P:   Lasix 40 mg IV x one 3/28 Free water 150 ml Q6  Replace electrolytes as needed  GASTROINTESTINAL A:   Nutrition. P:   TFs to goal Protonix for SUP  HEMATOLOGIC A:   Anemia of critical illness. P:  F/u CBC DVT: lovenox  INFECTIOUS A:   H1N1/Pneumococcal pneumonia >> completed Abx 3/25. New Fever 3/27. P:   Have PICC line placed and d/c Lt IJ CVL Repeat cx with fever and consider restarting Abx.  Monitor fever curve 3/28.  If not better, start abx am 3/29 Change foley cath  Pneumococcal Ag 3/15 >> positive Influenza PCR 3/15>> H1N1 pos Blood 3/22 >> neg Blood 3/28 >> Sputum 3/28 >>  UA 3/28 >>  ENDOCRINE A:   Hx of DM, hypothyroidism. P:   Continue synthroid SSI Lantus to 50 units 3/28 TF coverage  NEUROLOGIC A:   Anxiety,  insomnia, chronic pain. 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 P:   PT PRN fentanyl, versed    Likely would be good candidate for LTAC placement.  RN Case mgr referral.     Canary BrimBrandi Laura Radilla, NP-C [facility] Pgr: [phone] or [phone]   06/02/2014, 10:02 AM

## 2014-06-02 NOTE — Evaluation (Signed)
Physical Therapy Evaluation Patient Details Name: Grace BoschLinda K Mitchell MRN: [id number] DOB: Apr 08, 1945 Today's Date: 06/02/2014   History of Present Illness  69 yo female admitted with respiratory failure, ARDS, Pneumococcal Pna, H1N1. Intubated 3/16. Trach 3/23. PICC 3/27.  Hx of DM, HTN.  Clinical Impression  Bed level eval-pt still on vent. Initiated PROM UE/LE exercises. Strength 0/5 UEs/LEs. Will follow on a trial basis of 2x/week and progress activity as able when appropriate. Recommend LTACH.     Follow Up Recommendations LTACH    Equipment Recommendations   (to be determined)    Recommendations for Other Services OT consult     Precautions / Restrictions Precautions Precautions: Fall Restrictions Weight Bearing Restrictions: No      Mobility  Bed Mobility                  Transfers                    Ambulation/Gait                Stairs            Wheelchair Mobility    Modified Rankin (Stroke Patients Only)       Balance                                             Pertinent Vitals/Pain Pain Assessment: Faces Faces Pain Scale: No hurt    Home Living Family/patient expects to be discharged to:: Private residence Living Arrangements: Spouse/significant other Available Help at Discharge: Family Type of Home: House Home Access: Stairs to enter Entrance Stairs-Rails: None Secretary/administratorntrance Stairs-Number of Steps: 2 Home Layout: One level Home Equipment: Production assistant, radiohower seat;Cane - single point      Prior Function Level of Independence: Needs assistance   Gait / Transfers Assistance Needed: using RW  ADL's / Homemaking Assistance Needed: assist in/out of shower/tub. household duties/meals husband taking care of last month or so        Hand Dominance        Extremity/Trunk Assessment   Upper Extremity Assessment: RUE deficits/detail;LUE deficits/detail RUE Deficits / Details: ue swelling noted especially  hands/fingers. Limited assessment due to restricted extremity. PROM:Wrist flex/ext WFL   RUE Sensation: decreased light touch;decreased proprioception LUE Deficits / Details: ue swelling noted especially hands/fingers. PROM:shoulder flex ~60 degrees, full elbow flexion, wrist flex/ext WFL   Lower Extremity Assessment: LLE deficits/detail;RLE deficits/detail RLE Deficits / Details: PROM:hip/knee ~45 degrees, DF to neutral. PRAFO in place. Strength 0/5 LLE Deficits / Details: PROM:hip/knee ~45 degrees, DF to neutral. PRAFO in place. Strength 0/5     Communication   Communication: Other (comment) (vent)  Cognition Arousal/Alertness: Lethargic   Overall Cognitive Status: Difficult to assess                      General Comments      Exercises General Exercises - Upper Extremity Shoulder Flexion: PROM;Left;10 reps;Supine Elbow Flexion: PROM;Left;10 reps;Supine Wrist Flexion: PROM;Both;10 reps Wrist Extension: PROM;Both;10 reps Digit Composite Flexion: PROM;Both;10 reps;Supine Composite Extension: PROM;Both;10 reps;Supine General Exercises - Lower Extremity Ankle Circles/Pumps: PROM;Both;10 reps;Supine Heel Slides: PROM;Both;10 reps;Supine Other Exercises Other Exercises: PROM, IR/ER, supine, 10 reps, both      Assessment/Plan    PT Assessment Patient needs continued PT services  PT Diagnosis Quadraplegia   PT Problem List Decreased strength;Decreased range  of motion;Decreased activity tolerance;Decreased mobility;Decreased coordination;Decreased knowledge of use of DME;Cardiopulmonary status limiting activity;Impaired sensation;Impaired tone;Decreased balance  PT Treatment Interventions Functional mobility training;Therapeutic activities;Therapeutic exercise;Patient/family education;Balance training;Neuromuscular re-education   PT Goals (Current goals can be found in the Care Plan section) Acute Rehab PT Goals Patient Stated Goal: none stated PT Goal Formulation: With  family Time For Goal Achievement: 06/16/14 Potential to Achieve Goals: Poor    Frequency Min 2X/week   Barriers to discharge        Co-evaluation               End of Session   Activity Tolerance: Patient tolerated treatment well Patient left: in bed;with call bell/phone within reach;with family/visitor present           Time: 1610-9604 PT Time Calculation (min) (ACUTE ONLY): 20 min   Charges:   PT Evaluation $Initial PT Evaluation Tier I: 1 Procedure     PT G Codes:        Rebeca Alert, MPT Pager: [phone]

## 2014-06-02 NOTE — Progress Notes (Signed)
PHARMACY CONSULT: Lovenox for VTE prophylaxis   3/28: Pt remains on lovenox 40mg  sq q24h  Renal fxn stable, CrCl >30 ml/hr  CBC: Hgb low but stable, today 8.2, plts 410K  Plan: Continue Lovenox 40 mg SQ q24h. Pharmacy will sign-off.  Please re-consult if further dosing assistance needed.   Clance BollAmanda Corey Laski, PharmD, BCPS Pager: [phone] 06/02/2014 9:45 AM

## 2014-06-02 NOTE — Progress Notes (Signed)
CARE MANAGEMENT NOTE 06/02/2014  Patient:  Grace Grace Mitchell   Account Number:  [id number]  Date Initiated:  05/21/2014  Documentation initiated by:  DAVIS,RHONDA  Subjective/Objective Assessment:   sepsis and influenza     Action/Plan:   home when stable   Anticipated DC Date:  06/05/2014   Anticipated DC Plan:  LONG TERM ACUTE CARE (LTAC)  In-house referral  NA      DC Planning Services  NA      [facility]  NA   Choice offered to / List presented to:             Status of service:  In process, will continue to follow Medicare Important Message given?   (If response is "NO", the following Medicare IM given date fields will be blank) Date Medicare IM given:   Medicare IM given by:   Date Additional Medicare IM given:   Additional Medicare IM given by:    Discharge Disposition:    Per UR Regulation:  Reviewed for med. necessity/level of care/duration of stay  If discussed at Long Length of Stay Meetings, dates discussed:   05/27/2014  05/29/2014  06/03/2014    Comments:  June 02, 2014/Rhonda L. Earlene Plateravis, RN, BSN, CCM. Case Management [facility] [phone] No discharge needs present of time of review. remains vent dep. on trache collar at 30%,ards by cxr, septic.  May 29, 2014/Rhonda L. Earlene Plateravis, RN, BSN, CCM. Case Management [facility] [phone] No discharge needs present of time of review.   May 28, 2014/Rhonda L. Earlene Plateravis, RN, BSN, CCM. Case Management [facility] [phone] Patient trached this am, request for [facility] to review for [possible transfer for continued care.  May 26, 2014/Rhonda L. Earlene Plateravis, RN, BSN, CCM. Case Management [facility] [phone] No discharge needs present of time of review. remains on the vent, 50%fio2, hypotensive, agitated on propofol iv drip.  May 21, 2014/Rhonda L. Earlene Plateravis, RN, BSN, CCM. Case Management  [facility] [phone] No discharge needs present of time of  review.

## 2014-06-02 NOTE — Progress Notes (Signed)
[facility] FOR AM LAB REPLACEMENT ONLY  The patient does apply [facility] the [facility] Electroly[facility] Protocol based on the criteria listed below:   1. Is GFR >/= 40 ml/min? Yes.    Patient's GFR today is >90 2. Is urine output >/= 0.5 ml/kg/hr for the last 6 hours? Yes.   Patient's UOP is 1.26 ml/kg/hr 3. Is BUN < 60 mg/dL? Yes.    Patient's BUN today is 29 4. Abnormal electrolyte K 3.7 5. Ordered repletion with: per protocol 6. If a panic level lab has been reported, has the CCM MD in charge been notified? Yes.  .   Physician:  Keene BreathSood  Earnestine Tuohey McEachran 06/02/2014 6:31 AM

## 2014-06-02 NOTE — Progress Notes (Signed)
NUTRITION FOLLOW-UP  DOCUMENTATION CODES Per approved criteria  -Not Applicable   INTERVENTION: - Increase Vital AF 1.2 to new goal rate of 65 ml/hr via OGT.  Tube feeding regimen 1872 kcal (103% of needs), 117 grams of protein, and 1264 ml of H2O.   NUTRITION DIAGNOSIS: Inadequate oral intake related to inability to eat as evidenced by NPO; ongoing  Goal: Pt to meet >/= 90% of their estimated nutrition needs; met with TF.   Monitor:  Weight trend, initiation and toleration of TF, labs/CBGs, vent status/settings  69 y.o. female  Admitting Dx: H1N1, pneumonia  ASSESSMENT: 69 y.o. F brought to [facility] ED 3/15 with 4 day hx of generalized body aches, SOB, subjective fevers, chills. In ED, had fever to 103, was placed on NRB for hypoxia and had RR in high 30's. PCCM called for admission due to concern for potential of deterioration.   3/16: - Pt intubated 3/16 - Spoke with husband who reports that pt as eating well until she started to get sick about 2 weeks ago. No significant weight loss. Pt's usual body weight is 165 lbs.  - No signs of fat or muscle wasting.   3/18: - Pt with H1N1 and Pneumonia. Running low grade fever.  - Per RN, pt with 30 mL residuals. Having diarrhea. C Diff pending.  - TF running at goal rate of 65 mL/hr.  - CBGs elevated- Lantus increased per MD note. Followed by Diabetes Coordinator.  - Pt fluid overloaded (+8.7 L since admission.) Wt increased 12 lb from 3/17 to 3/18. Re-weighed during RD visit to confirm. On Lasix. Per RN, urine output increasing.   3/23: Lurline Idol placed, failed to wean  3/28: - Per RN, pt tolerating TF- Currently receiving Vital AF 1.2 @ 60 mL/hr providing 1728 kcal and 108 g protein.  - Will increase TF rate to better meet pt's nutritional needs. - Labs reviewed: CBGs elevated 197-225, Na elevated  Patient is currently intubated on ventilator support MV: 10.4 L/min Temp (24hrs), Avg:101 F (38.3 C), Min:100.6 F (38.1 C),  Max:101.5 F (38.6 C)  Propofol: none  Height: Ht Readings from Last 1 Encounters:  05/29/14 '5\' 6"'  (1.676 m)   Weight: Wt Readings from Last 1 Encounters:  06/02/14 167 lb 15.9 oz (76.2 kg)   BMI:  Body mass index is 27.13 kg/(m^2).  Estimated Nutritional Needs: Kcal: 1820 Protein: 100-115 g Fluid: 1.8 L/day  Skin: two small skin tears on sacrum  Diet Order:    EDUCATION NEEDS: -Education not appropriate at this time   Intake/Output Summary (Last 24 hours) at 06/02/14 1155 Last data filed at 06/02/14 0800  Gross per 24 hour  Intake   2345 ml  Output   1825 ml  Net    520 ml    Last BM: 3/27  Labs:   Recent Labs Lab 05/28/14 0400  05/29/14 0314 05/30/14 0325 06/01/14 0445 06/02/14 0520  NA 147*  < > 147* 147* 149* 148*  K 3.5  < > 3.6 4.0 3.3* 3.7  CL 101  < > 105 105 110 109  CO2 35*  < > 31 34* 30 30  BUN 46*  < > 43* 30* 26* 29*  CREATININE 0.79  < > 0.76 0.65 0.72 0.69  CALCIUM 8.4  < > 8.1* 8.2* 8.6 8.2*  MG 1.6  --  2.1 2.1  --   --   PHOS 3.4  --  3.5 3.5  --   --   GLUCOSE 237*  < >  162* 196* 213* 220*  < > = values in this interval not displayed.  CBG (last 3)   Recent Labs  06/02/14 0002 06/02/14 0446 06/02/14 0740  GLUCAP 197* 225* 204*    Scheduled Meds: . antiseptic oral rinse  7 mL Mouth Rinse QID  . aspirin  81 mg Oral Daily  . chlorhexidine  15 mL Mouth Rinse BID  . enoxaparin (LOVENOX) injection  40 mg Subcutaneous Q24H  . free water  150 mL Per Tube Q6H  . insulin aspart  0-20 Units Subcutaneous 6 times per day  . insulin aspart  3 Units Subcutaneous 6 times per day  . insulin glargine  50 Units Subcutaneous Daily  . levothyroxine  150 mcg Per Tube QAC breakfast  . lip balm      . metoprolol tartrate  25 mg Oral BID  . pantoprazole sodium  40 mg Per Tube Q24H  . simvastatin  20 mg Per Tube QPM  . sodium chloride  10-40 mL Intracatheter Q12H    Continuous Infusions: . feeding supplement (VITAL AF 1.2 CAL) 1,000 mL  (06/02/14 0700)    Past Medical History  Diagnosis Date  . Diabetes mellitus   . Hypothyroidism   . Hypertension   . Hyperlipidemia     Past Surgical History  Procedure Laterality Date  . Back surgery      Laurette Schimke [place], [place], [place]

## 2014-06-03 ENCOUNTER — Inpatient Hospital Stay (HOSPITAL_COMMUNITY): Payer: Commercial Managed Care - HMO

## 2014-06-03 LAB — CBC
HEMATOCRIT: 28.3 % — AB (ref 36.0–46.0)
HEMOGLOBIN: 8.1 g/dL — AB (ref 12.0–15.0)
MCH: 26.6 pg (ref 26.0–34.0)
MCHC: 28.6 g/dL — ABNORMAL LOW (ref 30.0–36.0)
MCV: 93.1 fL (ref 78.0–100.0)
Platelets: 417 10*3/uL — ABNORMAL HIGH (ref 150–400)
RBC: 3.04 MIL/uL — ABNORMAL LOW (ref 3.87–5.11)
RDW: 19.1 % — ABNORMAL HIGH (ref 11.5–15.5)
WBC: 18 10*3/uL — ABNORMAL HIGH (ref 4.0–10.5)

## 2014-06-03 LAB — BASIC METABOLIC PANEL
Anion gap: 7 (ref 5–15)
BUN: 34 mg/dL — AB (ref 6–23)
CO2: 30 mmol/L (ref 19–32)
CREATININE: 0.67 mg/dL (ref 0.50–1.10)
Calcium: 8.3 mg/dL — ABNORMAL LOW (ref 8.4–10.5)
Chloride: 112 mmol/L (ref 96–112)
GFR, EST NON AFRICAN AMERICAN: 90 mL/min — AB (ref >90)
Glucose, Bld: 198 mg/dL — ABNORMAL HIGH (ref 70–99)
Potassium: 3.7 mmol/L (ref 3.5–5.1)
SODIUM: 149 mmol/L — AB (ref 135–145)

## 2014-06-03 LAB — GLUCOSE, CAPILLARY
GLUCOSE-CAPILLARY: 195 mg/dL — AB (ref 70–99)
GLUCOSE-CAPILLARY: 217 mg/dL — AB (ref 70–99)
GLUCOSE-CAPILLARY: 230 mg/dL — AB (ref 70–99)
Glucose-Capillary: 126 mg/dL — ABNORMAL HIGH (ref 70–99)
Glucose-Capillary: 173 mg/dL — ABNORMAL HIGH (ref 70–99)
Glucose-Capillary: 179 mg/dL — ABNORMAL HIGH (ref 70–99)
Glucose-Capillary: 186 mg/dL — ABNORMAL HIGH (ref 70–99)

## 2014-06-03 LAB — ANTI-DNA ANTIBODY, DOUBLE-STRANDED

## 2014-06-03 LAB — PROCALCITONIN: Procalcitonin: 0.19 ng/mL

## 2014-06-03 LAB — RHEUMATOID FACTOR: RHEUMATOID FACTOR: 19.7 [IU]/mL — AB (ref 0.0–13.9)

## 2014-06-03 MED ORDER — INSULIN GLARGINE 100 UNIT/ML ~~LOC~~ SOLN
60.0000 [IU] | Freq: Every day | SUBCUTANEOUS | Status: DC
  Administered 2014-06-03: 60 [IU] via SUBCUTANEOUS
  Filled 2014-06-03 (×3): qty 0.6

## 2014-06-03 MED ORDER — PIPERACILLIN-TAZOBACTAM 3.375 G IVPB
3.3750 g | Freq: Three times a day (TID) | INTRAVENOUS | Status: DC
  Administered 2014-06-03 – 2014-06-04 (×2): 3.375 g via INTRAVENOUS
  Filled 2014-06-03 (×2): qty 50

## 2014-06-03 MED ORDER — POTASSIUM CHLORIDE 20 MEQ/15ML (10%) PO SOLN
20.0000 meq | ORAL | Status: AC
  Administered 2014-06-03: 20 meq
  Filled 2014-06-03 (×2): qty 15

## 2014-06-03 MED ORDER — LORAZEPAM 2 MG/ML IJ SOLN
0.5000 mg | INTRAMUSCULAR | Status: DC | PRN
Start: 2014-06-03 — End: 2014-06-13
  Administered 2014-06-03 – 2014-06-12 (×5): 1 mg via INTRAVENOUS
  Filled 2014-06-03 (×5): qty 1

## 2014-06-03 MED ORDER — VANCOMYCIN HCL IN DEXTROSE 750-5 MG/150ML-% IV SOLN
750.0000 mg | Freq: Two times a day (BID) | INTRAVENOUS | Status: DC
  Administered 2014-06-03 – 2014-06-05 (×4): 750 mg via INTRAVENOUS
  Filled 2014-06-03 (×5): qty 150

## 2014-06-03 MED ORDER — BACITRACIN ZINC 500 UNIT/GM EX OINT
1.0000 "application " | TOPICAL_OINTMENT | Freq: Two times a day (BID) | CUTANEOUS | Status: DC
  Administered 2014-06-03 – 2014-06-13 (×20): 1 via TOPICAL
  Filled 2014-06-03: qty 28.35

## 2014-06-03 MED ORDER — FREE WATER
200.0000 mL | Freq: Four times a day (QID) | Status: DC
  Administered 2014-06-03 – 2014-06-10 (×26): 200 mL

## 2014-06-03 NOTE — Progress Notes (Signed)
PULMONARY / CRITICAL CARE MEDICINE   Name: Grace BoschLinda K Mitchell MRN: [id number] DOB: Mar 28, 1945    ADMISSION DATE:  05/20/2014 CONSULTATION DATE:  06/03/2014  REFERRING MD :  EDP  CHIEF COMPLAINT:  Body aches, SOB, chills, fever  INITIAL PRESENTATION:  69 y/o female smoker with dyspnea, fever (Tm103F), chills, malaise from H1N1 influenza & pneumococcal PNA and sepsis.  STUDIES:   SIGNIFICANT EVENTS: 3/15 admit 3/16 ETT for resp failure 3/17 paralytic for worsening pO2/FIO2 3/26 change to pressure control 3/27 Fever >> change line 3/28 Attempted PSV wean, failed with tachypnea and low lung volumes   SUBJECTIVE:   Fever improved.  LTAC benefits will be in place after 4/5.  RN concerned pt anxious and versed too sedating    VITAL SIGNS: Temp:  [100.2 F (37.9 C)-101.8 F (38.8 C)] 100.8 F (38.2 C) (03/29 0800) Pulse Rate:  [103-122] 111 (03/29 0400) Resp:  [18-32] 32 (03/29 0800) BP: (90-157)/(40-65) 137/52 mmHg (03/29 0800) SpO2:  [97 %-100 %] 100 % (03/29 0800) FiO2 (%):  [30 %] 30 % (03/29 0800) Weight:  [165 lb 2 oz (74.9 kg)] 165 lb 2 oz (74.9 kg) (03/29 0423)   INTAKE / OUTPUT: Intake/Output      03/28 0701 - 03/29 0700 03/29 0701 - 03/30 0700   I.V. (mL/kg) 250 (3.3) 10 (0.1)   NG/GT 1751.1 65   Total Intake(mL/kg) 2001.1 (26.7) 75 (1)   Urine (mL/kg/hr) 2015 (1.1)    Stool 0 (0)    Total Output 2015     Net -13.9 +75        Stool Occurrence 2 x      PHYSICAL EXAMINATION: General: ill appearing female in no distress Neuro: follows commands, diffusely weak, able to move fingers to command HEENT: Trach site c/d/i Cardiovascular: regular, tachycardic Lungs: even/non-labored, lungs bilaterally with coarse rhonchi Abdomen: soft, non tender Musculoskeletal: 1+ edema Skin: no rashes, R wrist with blistering, appearance of possible adhesive tear (had a R aline)  LABS:  CBC  Recent Labs Lab 06/01/14 0445 06/02/14 0520 06/03/14 0400  WBC 23.6* 19.7* 18.0*   HGB 8.2* 8.2* 8.1*  HCT 27.9* 28.4* 28.3*  PLT 364 410* 417*   Coag's  Recent Labs Lab 05/28/14 0400  APTT 27  INR 1.03   BMET  Recent Labs Lab 06/01/14 0445 06/02/14 0520 06/03/14 0400  NA 149* 148* 149*  K 3.3* 3.7 3.7  CL 110 109 112  CO2 30 30 30   BUN 26* 29* 34*  CREATININE 0.72 0.69 0.67  GLUCOSE 213* 220* 198*   Electrolytes  Recent Labs Lab 05/28/14 0400  05/29/14 0314 05/30/14 0325 06/01/14 0445 06/02/14 0520 06/03/14 0400  CALCIUM 8.4  < > 8.1* 8.2* 8.6 8.2* 8.3*  MG 1.6  --  2.1 2.1  --   --   --   PHOS 3.4  --  3.5 3.5  --   --   --   < > = values in this interval not displayed.   ABG  Recent Labs Lab 05/31/14 1610 06/02/14 0435  PHART 7.577* 7.503*  PCO2ART 34.9* 38.6  PO2ART 90.5 114.0*   Liver Enzymes No results for input(s): AST, ALT, ALKPHOS, BILITOT, ALBUMIN in the last 168 hours.   Glucose  Recent Labs Lab 06/02/14 1230 06/02/14 1546 06/02/14 1953 06/03/14 0015 06/03/14 0413 06/03/14 0737  GLUCAP 237* 213* 167* 217* 186* 195*    Imaging Dg Chest Port 1 View  06/03/2014   CLINICAL DATA:  Respiratory failure.  EXAM: PORTABLE CHEST - 1 VIEW  COMPARISON:  06/02/2014.  FINDINGS: Tracheostomy tube, NG tube, right PICC line in stable position. Heart size stable. Persistent diffuse bilateral pulmonary infiltrates remain. No change. No pleural effusion or pneumothorax. No acute osseus abnormality.  IMPRESSION: 1. Lines and tubes in stable position. 2. Persistent unchanged bilateral pulmonary infiltrates. ARDS/bilateral pneumonia could present this fashion.   Electronically Signed   By: Maisie Fus  Register   On: 06/03/2014 07:18   Dg Chest Port 1 View  06/02/2014   CLINICAL DATA:  ARDS.  EXAM: PORTABLE CHEST - 1 VIEW  COMPARISON:  06/01/2014 .  FINDINGS: Interim removal of left IJ line. Interim placement of right PICC line. PICC line tip is at the cavoatrial junction. Tracheostomy tube and NG tube in stable position. Diffuse unchanged  bilateral pulmonary infiltrates. No pleural effusion or pneumothorax. Heart size and pulmonary vascularity stable. No acute osseous abnormality.  IMPRESSION: 1. Interim removal of left IJ line. Interim placement of right PICC line, its tip is at the cavoatrial junction. Tracheostomy tube and NG tube in stable position. 2. Unchanged diffuse bilateral pulmonary infiltrates. ARDS/bilateral pneumonia could present in this fashion.   Electronically Signed   By: Maisie Fus  Register   On: 06/02/2014 07:16   ASSESSMENT / PLAN:  PULMONARY ET 3/16>>3/24 Trach 3/24>> A: Acute respiratory failure with ARDS 2nd to H1N1 and Pneumococcal PNA . Failure to wean from vent s/p trach. Tobacco abuse. P:   Changed to pressure control 3/26 >> decrease PC to 22 and RR to 20 on 3/27 Wean to trach collar as tolerated >> volume status and weakness seem to be issues preventing weaning F/u CXR, ABG Likely LTAC for rehab  CARDIOVASCULAR Lt IJ CVL 3/16 >> 3/27 RUE PICC 3/27 >>  A:  Sepsis 2nd to PNA >> resolved. Tachycardia - suspect fever related  Hx HTN, HLD. P:  ASA, zocor Lopressor 25 BID  RENAL A:   AKI from sepsis >> resolved. Hypernatremia. Hypervolemia. Hyponatremia. P:   Lasix 40 mg IV x one 3/28 Free water to 200 ml Q6  Replace electrolytes as needed  GASTROINTESTINAL A:   Nutrition. P:   TFs to goal Protonix for SUP IR consult for PEG placement  HEMATOLOGIC A:   Anemia of critical illness. P:  F/u CBC DVT: lovenox  INFECTIOUS A:   H1N1/Pneumococcal pneumonia >> completed Abx 3/25. New Fever 3/27. P:   Replaced lines 3/27 Monitor fever curve, modest improvement 3/29.  Consider doppler LE's  Pneumococcal Ag 3/15 >> positive Influenza PCR 3/15>> H1N1 pos Blood 3/22 >> neg Blood 3/28 >> Sputum 3/28 >>  UA 3/28 >> neg  ENDOCRINE A:   Hx of DM with Neuropathy, hypothyroidism. P:   Continue synthroid SSI Lantus to 60 units 3/29 TF coverage  NEUROLOGIC A:   Anxiety,  insomnia, chronic pain. 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 P:   PT PRN fentanyl PRN ativan for anxiety     Likely would be good candidate for LTAC placement.  RN Case mgr working on placement.  Patient meets 21 days on 4/5 and will be eligible for transfer.      Canary Brim, NP-C [facility] Pgr: [phone] or [phone]   06/03/2014, 8:50 AM

## 2014-06-03 NOTE — Progress Notes (Signed)
Evening Rounds:   Noted ongoing fever, tmax 102 via temp foley. Lines changed 3/27, re-cultured 3/28.    Plan: Resume Vanco / Zosyn per pharmacy  Follow up cultures from 3/28 Assess HIV Assess LE dopplers to r/o DVT Assess KUB    Canary BrimBrandi Nadine Ryle, NP-C [facility] Pgr: [phone] or [phone]

## 2014-06-03 NOTE — Progress Notes (Signed)
[facility] FOR [facility] REPLACEMENT ONLY  The [facility] does [facility] based on the criteria listed below:   1. Is GFR >/= 40 ml/min? Yes.    Patient's GFR today is >90 2. Is urine output >/= 0.5 ml/kg/hr for the last 6 hours? Yes.   Patient's UOP is .77 ml/kg/hr 3. Is BUN < 60 mg/dL? Yes.    Patient's BUN today is 34 4. Abnormal electrolyte  K 3.7 5. Ordered repletion with: per protocol 6. If a panic level lab has been reported, has the CCM MD in charge been notified? Yes.  .   Physician:  Keene BreathSood  Alfonso Carden McEachran 06/03/2014 5:58 AM

## 2014-06-03 NOTE — Progress Notes (Signed)
ANTIBIOTIC CONSULT NOTE - INITIAL  Pharmacy Consult for Vancomycin and Zosyn Indication: rule out sepsis  No Known Allergies  Patient Measurements: Height:  (167.6 cm) Weight: 165 lb 2 oz (74.9 kg) IBW/kg (Calculated) : 59.3  Vital Signs: Temp: 102 F (38.9 C) (03/29 1500) Temp Source: Core (Comment) (03/29 1500) BP: 139/48 mmHg (03/29 1500) Intake/Output from previous day: 03/28 0701 - 03/29 0700 In: 2001.1 [I.V.:250; NG/GT:1751.1] Out: 2015 [Urine:2015] Intake/Output from this shift: Total I/O In: 865.3 [I.V.:80; NG/GT:785.3] Out: 505 [Urine:505]  Labs:  Recent Labs  06/01/14 0445 06/02/14 0520 06/03/14 0400  WBC 23.6* 19.7* 18.0*  HGB 8.2* 8.2* 8.1*  PLT 364 410* 417*  CREATININE 0.72 0.69 0.67   Estimated Creatinine Clearance: 71.5 mL/min (by C-G formula based on Cr of 0.67). No results for input(s): VANCOTROUGH, VANCOPEAK, VANCORANDOM, GENTTROUGH, GENTPEAK, GENTRANDOM, TOBRATROUGH, TOBRAPEAK, TOBRARND, AMIKACINPEAK, AMIKACINTROU, AMIKACIN in the last 72 hours.   Microbiology: Recent Results (from the past 720 hour(s))  Urine culture     Status: None   Collection Time: 05/20/14  3:49 PM  Result Value Ref Range Status   Specimen Description URINE, CLEAN CATCH  Final   Special Requests NONE  Final   Colony Count   Final    30,000 COLONIES/ML Performed at [facility]    Culture   Final    Multiple bacterial morphotypes present, none predominant. Suggest appropriate recollection if clinically indicated. Performed at [facility]    Report Status 05/21/2014 FINAL  Final  MRSA PCR Screening     Status: None   Collection Time: 05/20/14  4:04 PM  Result Value Ref Range Status   MRSA by PCR NEGATIVE NEGATIVE Final    Comment:        The GeneXpert MRSA Assay (FDA approved for NASAL specimens only), is one component of a comprehensive MRSA colonization surveillance program. It is not intended to diagnose MRSA infection nor to guide  or monitor treatment for MRSA infections.   Blood Culture (routine x 2)     Status: None   Collection Time: 05/20/14  4:26 PM  Result Value Ref Range Status   Specimen Description BLOOD LEFT ANTECUBITAL  Final   Special Requests BOTTLES DRAWN AEROBIC AND ANAEROBIC  Final   Culture   Final    NO GROWTH 5 DAYS Performed at [facility]    Report Status 05/26/2014 FINAL  Final  Blood Culture (routine x 2)     Status: None   Collection Time: 05/20/14  4:34 PM  Result Value Ref Range Status   Specimen Description BLOOD LEFT HAND  Final   Special Requests BOTTLES DRAWN AEROBIC AND ANAEROBIC 4CC  Final   Culture   Final    NO GROWTH 5 DAYS Performed at [facility]    Report Status 05/26/2014 FINAL  Final  Culture, respiratory (NON-Expectorated)     Status: None   Collection Time: 05/21/14 12:32 PM  Result Value Ref Range Status   Specimen Description TRACHEAL ASPIRATE  Final   Special Requests Normal  Final   Gram Stain   Final    FEW WBC PRESENT,BOTH PMN AND MONONUCLEAR RARE SQUAMOUS EPITHELIAL CELLS PRESENT NO ORGANISMS SEEN Performed at [facility]    Culture   Final    NO GROWTH 2 DAYS Performed at [facility]    Report Status 05/23/2014 FINAL  Final  Clostridium Difficile by PCR     Status: None   Collection Time: 05/23/14  8:56 AM  Result Value Ref Range Status   C difficile by pcr NEGATIVE NEGATIVE Final  Culture, respiratory (NON-Expectorated)     Status: None   Collection Time: 05/27/14 10:00 AM  Result Value Ref Range Status   Specimen Description TRACHEAL ASPIRATE  Final   Special Requests Normal  Final   Gram Stain   Final    ABUNDANT WBC PRESENT, PREDOMINANTLY PMN NO SQUAMOUS EPITHELIAL CELLS SEEN NO ORGANISMS SEEN [facility]    Culture   Final    FEW CANDIDA ALBICANS [facility]    Report Status 05/29/2014 FINAL  Final  Culture, Urine     Status: None   Collection  Time: 05/27/14 10:00 AM  Result Value Ref Range Status   Specimen Description URINE, CATHETERIZED  Final   Special Requests Normal  Final   Colony Count NO GROWTH [facility]   Final   Culture NO GROWTH [facility]   Final   Report Status 05/28/2014 FINAL  Final  Culture, blood (routine x 2)     Status: None   Collection Time: 05/27/14 10:35 AM  Result Value Ref Range Status   Specimen Description BLOOD RIGHT HAND  Final   Special Requests BOTTLES DRAWN AEROBIC ONLY 3CC  Final   Culture   Final    NO GROWTH 5 DAYS [facility]    Report Status 06/02/2014 FINAL  Final  Culture, blood (routine x 2)     Status: None   Collection Time: 05/27/14 10:45 AM  Result Value Ref Range Status   Specimen Description BLOOD RIGHT ARM  Final   Special Requests BOTTLES DRAWN AEROBIC AND ANAEROBIC 6CC  Final   Culture   Final    NO GROWTH 5 DAYS [facility]    Report Status 06/02/2014 FINAL  Final  Culture, respiratory (NON-Expectorated)     Status: None (Preliminary result)   Collection Time: 06/02/14 10:41 AM  Result Value Ref Range Status   Specimen Description TRACHEAL ASPIRATE  Final   Special Requests Normal  Final   Gram Stain   Final    ABUNDANT WBC PRESENT, PREDOMINANTLY PMN NO SQUAMOUS EPITHELIAL CELLS SEEN MODERATE GRAM POSITIVE COCCI IN PAIRS IN CLUSTERS FEW GRAM NEGATIVE RODS [facility]    Culture   Final    Non-Pathogenic Oropharyngeal-type Flora Isolated. [facility]formed [facility]    Report Status PENDING  Incomplete  Culture, blood (routine x 2)     Status: None (Preliminary result)   Collection Time: 06/02/14 10:50 AM  Result Value Ref Range Status   Specimen Description BLOOD LEFT HAND  Final   Special Requests BOTTLES DRAWN AEROBIC AND ANAEROBIC 6 CC EA  Final   Culture   Final           BLOOD CULTURE RECEIVED NO GROWTH TO DATE CULTURE WILL BE  HELD FOR 5 DAYS BEFORE ISSUING A FINAL NEGATIVE REPORT [facility]    Report Status PENDING  Incomplete  Culture, blood (routine x 2)     Status: None (Preliminary result)   Collection Time: 06/02/14 11:00 AM  Result Value Ref Range Status   Specimen Description BLOOD LEFT ARM  Final   Special Requests BOTTLES DRAWN AEROBIC AND ANAEROBIC 5 CC BLUE  Final   Culture   Final           BLOOD CULTURE  RECEIVED NO GROWTH TO DATE CULTURE WILL BE HELD FOR 5 DAYS BEFORE ISSUING A FINAL NEGATIVE REPORT Performed at [facility]    Report Status PENDING  Incomplete    Medical History: Past Medical History  Diagnosis Date  . Diabetes mellitus   . Hypothyroidism   . Hypertension   . Hyperlipidemia     Medications:  Scheduled:  . antiseptic oral rinse  7 mL Mouth Rinse QID  . aspirin  81 mg Oral Daily  . bacitracin  1 application Topical BID  . chlorhexidine  15 mL Mouth Rinse BID  . enoxaparin (LOVENOX) injection  40 mg Subcutaneous Q24H  . free water  200 mL Per Tube Q6H  . insulin aspart  0-20 Units Subcutaneous 6 times per day  . insulin aspart  3 Units Subcutaneous 6 times per day  . insulin glargine  60 Units Subcutaneous Daily  . levothyroxine  150 mcg Per Tube QAC breakfast  . metoprolol tartrate  25 mg Oral BID  . pantoprazole sodium  40 mg Per Tube Q24H  . simvastatin  20 mg Per Tube QPM  . sodium chloride  10-40 mL Intracatheter Q12H   Infusions:  . feeding supplement (VITAL AF 1.2 CAL) 1,000 mL (06/03/14 1625)   PRN: sodium chloride, acetaminophen (TYLENOL) oral liquid 160 mg/5 mL, fentaNYL, LORazepam, sodium chloride  Anti-infectives: 3/15 >> Vanc >> 3/15; restart 3/22 >> 3/25, restart 3/29 >> 3/15 >> Zosyn>> 3/15, restart 23/29 >> 3/15 >> Ceftriaxone >> 3/25 3/15 >> Azithromycin >> 3/19 3/15 >> Tamiflu >> 3/20  Cultures: 3/15 blood x 2: NGF 3/16 urine: 30K cfu/ml, no predominant species 3/16 influenza panel: + influenza A,  H1N1 3/15 strep pneumo ur ag: POSITIVE 3/15 legionella ur ag: neg 3/16 trach asp cx: ng - final 3/18 cdiff negative 3/22 bcx x2: NGF 3/22 ucx: NGF 3/22 TA: few candida FINAL 3/28 blood: ngtd 3/28 trach asp: non-pathogenic flora  Vitals / Pertinent Labs: Tmax: 102 (APAP) WBCs: 18k, slightly improved Renal: Scr improved to WNL, CrCl 71 ml/min CG, 78 ml/min normlaized (using 0.8) PCT 6.94 (3/15) > 9.26 (3/16) LA 3.31 > 2.5 (3/15)  Previous vancomycin drug level / dose changes info: 3/24: 0900 VT = 23.5 on 1g q12h (reduce to  q12h)  Goal of Therapy:  Vancomycin trough level 15-20 mcg/ml  Zosyn dose appropriate for indication, renal function  Assessment: 69 yo female known to pharmacy from previous antibiotic protocols.  Patient with continued fevers despite line changes on 3/25. Pharmacy consulted to resume vancomycin and zosyn dosing.  Plan:  Zosyn 3.375gm IV q8h (4hr extended infusions) Vancomycin  IV q12h based on previous dose/levels Check trough at steady state Follow up renal function & cultures  Loralee Pacas, PharmD, BCPS Pager: [phone] 06/03/2014,4:34 PM

## 2014-06-03 NOTE — Progress Notes (Signed)
Subjective: Grace Mitchell has been following progress for planned perc G-tube placement. Originally discussed last week, case was reviewed and pt husband was agreeable and consented. Pt has had persistent fevers and Leukocytosis. Still remains with low grade fevers, but WBC is now trending down, last WBC 18k Blood Cxs from 3/22 were negative and repeat blood Cxs from 3/28 are negative so far. At this point, feel safe to move forward with G-tube as pt is also to placement to LTAc.  Objective: Physical Exam: BP 137/52 mmHg  Pulse 111  Temp(Src) 100.8 F (38.2 C) (Core (Comment))  Resp 32  Ht 5\' 6"  (1.676 m)  Wt 165 lb 2 oz (74.9 kg)  BMI 26.66 kg/m2  SpO2 100% NG in nare receiving TF Trach intact, no issues Lungs: clear Heart: reg Abd: soft    Labs: CBC  Recent Labs  06/02/14 0520 06/03/14 0400  WBC 19.7* 18.0*  HGB 8.2* 8.1*  HCT 28.4* 28.3*  PLT 410* 417*   BMET  Recent Labs  06/02/14 0520 06/03/14 0400  NA 148* 149*  K 3.7 3.7  CL 109 112  CO2 30 30  GLUCOSE 220* 198*  BUN 29* 34*  CREATININE 0.69 0.67  CALCIUM 8.2* 8.3*   PT/INR 13.6/1.0   Studies/Results: Dg Chest Port 1 View  06/03/2014   CLINICAL DATA:  Respiratory failure.  EXAM: PORTABLE CHEST - 1 VIEW  COMPARISON:  06/02/2014.  FINDINGS: Tracheostomy tube, NG tube, right PICC line in stable position. Heart size stable. Persistent diffuse bilateral pulmonary infiltrates remain. No change. No pleural effusion or pneumothorax. No acute osseus abnormality.  IMPRESSION: 1. Lines and tubes in stable position. 2. Persistent unchanged bilateral pulmonary infiltrates. ARDS/bilateral pneumonia could present this fashion.   Electronically Signed   By: Maisie Fushomas  Register   On: 06/03/2014 07:18   Dg Chest Port 1 View  06/02/2014   CLINICAL DATA:  ARDS.  EXAM: PORTABLE CHEST - 1 VIEW  COMPARISON:  06/01/2014 .  FINDINGS: Interim removal of left IJ line. Interim placement of right PICC line. PICC line tip is at the  cavoatrial junction. Tracheostomy tube and NG tube in stable position. Diffuse unchanged bilateral pulmonary infiltrates. No pleural effusion or pneumothorax. Heart size and pulmonary vascularity stable. No acute osseous abnormality.  IMPRESSION: 1. Interim removal of left IJ line. Interim placement of right PICC line, its tip is at the cavoatrial junction. Tracheostomy tube and NG tube in stable position. 2. Unchanged diffuse bilateral pulmonary infiltrates. ARDS/bilateral pneumonia could present in this fashion.   Electronically Signed   By: Maisie Fushomas  Register   On: 06/02/2014 07:16    Assessment/Plan: Dysphagia Respiratory failure Fever and leukocytosis, WBC trend down, fevers low grade. Cxs have been negative, feel infectious are improving and continue to be treated. Plan to move forward with G-tube tomorrow. Reviewed procedure with husband at bedside today.    LOS: 14 days    Brayton ElBRUNING, Balin Vandegrift PA-C 06/03/2014 12:31 PM

## 2014-06-04 LAB — CBC
HEMATOCRIT: 26.8 % — AB (ref 36.0–46.0)
Hemoglobin: 7.9 g/dL — ABNORMAL LOW (ref 12.0–15.0)
MCH: 27.7 pg (ref 26.0–34.0)
MCHC: 29.5 g/dL — ABNORMAL LOW (ref 30.0–36.0)
MCV: 94 fL (ref 78.0–100.0)
Platelets: 450 10*3/uL — ABNORMAL HIGH (ref 150–400)
RBC: 2.85 MIL/uL — ABNORMAL LOW (ref 3.87–5.11)
RDW: 19.3 % — ABNORMAL HIGH (ref 11.5–15.5)
WBC: 15.3 10*3/uL — ABNORMAL HIGH (ref 4.0–10.5)

## 2014-06-04 LAB — GLUCOSE, CAPILLARY
GLUCOSE-CAPILLARY: 137 mg/dL — AB (ref 70–99)
GLUCOSE-CAPILLARY: 116 mg/dL — AB (ref 70–99)
Glucose-Capillary: 80 mg/dL (ref 70–99)
Glucose-Capillary: 64 mg/dL — ABNORMAL LOW (ref 70–99)
Glucose-Capillary: 94 mg/dL (ref 70–99)
Glucose-Capillary: 190 mg/dL — ABNORMAL HIGH (ref 70–99)

## 2014-06-04 LAB — BASIC METABOLIC PANEL
ANION GAP: 7 (ref 5–15)
BUN: 26 mg/dL — ABNORMAL HIGH (ref 6–23)
CALCIUM: 8.3 mg/dL — AB (ref 8.4–10.5)
CO2: 28 mmol/L (ref 19–32)
Chloride: 113 mmol/L — ABNORMAL HIGH (ref 96–112)
Creatinine, Ser: 0.72 mg/dL (ref 0.50–1.10)
GFR calc non Af Amer: 88 mL/min — ABNORMAL LOW (ref >90)
GLUCOSE: 114 mg/dL — AB (ref 70–99)
Potassium: 3.7 mmol/L (ref 3.5–5.1)
SODIUM: 148 mmol/L — AB (ref 135–145)

## 2014-06-04 LAB — PROCALCITONIN: PROCALCITONIN: 0.19 ng/mL

## 2014-06-04 LAB — HIV ANTIBODY (ROUTINE TESTING W REFLEX): HIV Screen 4th Generation wRfx: NONREACTIVE

## 2014-06-04 MED ORDER — POTASSIUM CHLORIDE 20 MEQ/15ML (10%) PO SOLN
20.0000 meq | ORAL | Status: AC
  Administered 2014-06-04: 20 meq
  Filled 2014-06-04: qty 15

## 2014-06-04 MED ORDER — DEXTROSE 50 % IV SOLN
INTRAVENOUS | Status: AC
  Administered 2014-06-04: 25 mL
  Filled 2014-06-04: qty 50

## 2014-06-04 MED ORDER — DEXTROSE 50 % IV SOLN
INTRAVENOUS | Status: AC
  Filled 2014-06-04: qty 50

## 2014-06-04 MED ORDER — FUROSEMIDE 10 MG/ML IJ SOLN
40.0000 mg | Freq: Two times a day (BID) | INTRAMUSCULAR | Status: AC
  Administered 2014-06-04 (×2): 40 mg via INTRAVENOUS
  Filled 2014-06-04 (×2): qty 4

## 2014-06-04 MED ORDER — POTASSIUM CHLORIDE 20 MEQ/15ML (10%) PO SOLN
40.0000 meq | Freq: Once | ORAL | Status: AC
  Administered 2014-06-04: 40 meq
  Filled 2014-06-04: qty 30

## 2014-06-04 MED ORDER — AMITRIPTYLINE HCL 10 MG PO TABS
10.0000 mg | ORAL_TABLET | Freq: Every day | ORAL | Status: DC
  Administered 2014-06-04 – 2014-06-12 (×9): 10 mg via ORAL
  Filled 2014-06-04 (×10): qty 1

## 2014-06-04 NOTE — Progress Notes (Signed)
[facility]  The [facility] does [facility] based on the criteria listed below:   1. Is GFR >/= 40 ml/min? Yes.    Patient's GFR today is 88 2. Is urine output >/= 0.5 ml/kg/hr for the last 6 hours? Yes.   Patient's UOP is .85 ml/kg/hr 3. Is BUN < 60 mg/dL? Yes.    Patient's BUN today is 26 4. Abnormal electrolyte  K 3.7 5. Ordered repletion with: per protocol 6. If a panic level lab has been reported, has the CCM MD in charge been notified? Yes.  .   Physician:  Tonny BranchSommer  Shataya Winkles, Lanora ManisElizabeth McEachran 06/04/2014 6:18 AM

## 2014-06-04 NOTE — Progress Notes (Signed)
*  PRELIMINARY RESULTS* Vascular Ultrasound Lower extremity venous duplex has been completed.  Preliminary findings: Negative for DVT bilaterally. 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 (This was noted in test from 09/2012 as well)    Farrel DemarkJill Eunice, RDMS, RVT  06/04/2014, 8:57 AM

## 2014-06-04 NOTE — Progress Notes (Signed)
Physical Therapy Treatment Patient Details Name: Grace Mitchell MRN: [id number] DOB: September 28, 1945 Today's Date: 06/04/2014    History of Present Illness 69 yo female admitted with respiratory failure, ARDS, Pneumococcal Pna, H1N1. Intubated 3/16. Trach 3/23. PICC 3/27.  Hx of DM, HTN.    PT Comments    Session focused on getting pt in a more upright position. Positioned in chair position in bed. Poor trunk control-leaning over to L side so pillows positioned to limit this. Noted small movement L hand when commanded pt to squeeze therapist's hand-able to do this x 2 on command. This was only movement noted to happen on command this session. Educated pt's husband briefly on LE PROM that he can perform x 10 reps a few times/day. Also okay with him working on getting her to squeeze his hand and flexing elbows. Cautioned husband to mind/try to stay clear of lines as much as possible. Made RN aware of pt's positioning at end of session.  Follow Up Recommendations  LTACH     Equipment Recommendations   (to be determined)    Recommendations for Other Services OT consult     Precautions / Restrictions Precautions Precautions: Fall Precaution Comments: vent Restrictions Weight Bearing Restrictions: No    Mobility  Bed Mobility               General bed mobility comments: NT- pt put in chair position in bed. +2 for safety to manage lines  Transfers                    Ambulation/Gait                 Stairs            Wheelchair Mobility    Modified Rankin (Stroke Patients Only)       Balance Overall balance assessment: Needs assistance   Sitting balance-Leahy Scale: Zero Sitting balance - Comments: Left lateral lean noted with HOB more upright. Pillows positioned to maintain upright posture. Postural control: Left lateral lean                          Cognition Arousal/Alertness: Lethargic;Suspect due to medications Behavior During  Therapy: Flat affect Overall Cognitive Status: Difficult to assess                      Exercises General Exercises - Upper Extremity Elbow Flexion: PROM;Both;10 reps;Seated Digit Composite Flexion: PROM;Both;10 reps;Seated Composite Extension: PROM;Both;10 reps;Seated General Exercises - Lower Extremity Ankle Circles/Pumps: PROM;Both;10 reps;Seated Long Arc Quad: PROM;Both;10 reps;Seated    General Comments        Pertinent Vitals/Pain Pain Assessment: Faces Faces Pain Scale: No hurt    Home Living                      Prior Function            PT Goals (current goals can now be found in the care plan section) Progress towards PT goals: Progressing toward goals (very slowly)    Frequency  Min 2X/week    PT Plan Current plan remains appropriate    Co-evaluation             End of Session   Activity Tolerance: Patient limited by fatigue (pt with difficulty keeping eyes open/remaining alert) Patient left: in bed;with call bell/phone within reach;with family/visitor present     Time: 4098-1191 PT Time Calculation (  min) (ACUTE ONLY): 13 min  Charges:  $Therapeutic Activity: 8-22 mins                    G Codes:      Rebeca AlertJannie Linus Weckerly, MPT Pager: [phone]

## 2014-06-04 NOTE — Progress Notes (Signed)
Planned for G-tube today. Temp curve was trending down and WBC trending down too, today at 15k However, temp spike to 102.9 Sputum cx also now positive for Staph Plain film abd xrays suggest bowel distention/ileus? May not have safe perc access until this improves also. Per Dr. Marchelle Gearingamaswamy, will HOLD on procedure for now.  Brayton ElKevin Hadden Steig PA-C Interventional Radiology 06/04/2014 3:29 PM

## 2014-06-04 NOTE — Progress Notes (Signed)
PULMONARY / CRITICAL CARE MEDICINE   Name: Grace BoschLinda K Quirino MRN: [id number] DOB: 1946-02-17    ADMISSION DATE:  05/20/2014 CONSULTATION DATE:  06/04/2014  REFERRING MD :  EDP  CHIEF COMPLAINT:  Body aches, SOB, chills, fever  INITIAL PRESENTATION:  69 y/o female smoker with dyspnea, fever (Tm103F), chills, malaise from H1N1 influenza & pneumococcal PNA and sepsis.  STUDIES:  3/30  LE Doppler >> Negative for DVT bilaterally   SIGNIFICANT EVENTS: 3/15  admit 3/16  ETT for resp failure 3/17  paralytic for worsening pO2/FIO2 3/26  change to pressure control 3/27  Fever >> change line 3/28  Attempted PSV wean, failed with tachypnea and low lung volumes 3/29  Initially fever improved, then spike to 102   SUBJECTIVE:   RN reports no acute events.  Pending PEG (? If will proceed with ongoing fever without source)   VITAL SIGNS: Temp:  [100.2 F (37.9 C)-102.2 F (39 C)] 100.2 F (37.9 C) (03/30 0811) Pulse Rate:  [104-117] 104 (03/30 0345) Resp:  [0-35] 34 (03/30 0811) BP: (90-151)/(37-62) 139/57 mmHg (03/30 0600) SpO2:  [97 %-100 %] 98 % (03/30 0811) FiO2 (%):  [30 %] 30 % (03/30 0345) Weight:  [168 lb 10.4 oz (76.5 kg)] 168 lb 10.4 oz (76.5 kg) (03/30 0447)   INTAKE / OUTPUT: Intake/Output      03/29 0701 - 03/30 0700 03/30 0701 - 03/31 0700   I.V. (mL/kg) 170 (2.2)    NG/GT 1505.3    IV Piggyback 400    Total Intake(mL/kg) 2075.3 (27.1)    Urine (mL/kg/hr) 1240 (0.7)    Stool 0 (0)    Total Output 1240     Net +835.3          Stool Occurrence 1 x      PHYSICAL EXAMINATION: General: ill appearing female in no distress Neuro: follows commands, diffusely weak, able to raise arms off bed to command HEENT: Trach site c/d/i Cardiovascular: regular, tachycardic Lungs: even/non-labored, lungs bilaterally with coarse rhonchi Abdomen: soft, non tender Musculoskeletal: 1+ edema Skin: no rashes, R wrist with blistering, appearance of possible adhesive tear (had a R  aline)  LABS:  CBC  Recent Labs Lab 06/02/14 0520 06/03/14 0400 06/04/14 0452  WBC 19.7* 18.0* 15.3*  HGB 8.2* 8.1* 7.9*  HCT 28.4* 28.3* 26.8*  PLT 410* 417* 450*   Coag's No results for input(s): APTT, INR in the last 168 hours.   BMET  Recent Labs Lab 06/02/14 0520 06/03/14 0400 06/04/14 0452  NA 148* 149* 148*  K 3.7 3.7 3.7  CL 109 112 113*  CO2 30 30 28   BUN 29* 34* 26*  CREATININE 0.69 0.67 0.72  GLUCOSE 220* 198* 114*   Electrolytes  Recent Labs Lab 05/29/14 0314 05/30/14 0325  06/02/14 0520 06/03/14 0400 06/04/14 0452  CALCIUM 8.1* 8.2*  < > 8.2* 8.3* 8.3*  MG 2.1 2.1  --   --   --   --   PHOS 3.5 3.5  --   --   --   --   < > = values in this interval not displayed.   ABG  Recent Labs Lab 05/31/14 1610 06/02/14 0435  PHART 7.577* 7.503*  PCO2ART 34.9* 38.6  PO2ART 90.5 114.0*   Liver Enzymes No results for input(s): AST, ALT, ALKPHOS, BILITOT, ALBUMIN in the last 168 hours.   Glucose  Recent Labs Lab 06/03/14 1129 06/03/14 1544 06/03/14 1942 06/03/14 2335 06/04/14 0309 06/04/14 0759  GLUCAP 230* 179* 126*  173* 137* 80    Imaging Dg Chest Port 1 View  06/03/2014   CLINICAL DATA:  Respiratory failure.  EXAM: PORTABLE CHEST - 1 VIEW  COMPARISON:  06/02/2014.  FINDINGS: Tracheostomy tube, NG tube, right PICC line in stable position. Heart size stable. Persistent diffuse bilateral pulmonary infiltrates remain. No change. No pleural effusion or pneumothorax. No acute osseus abnormality.  IMPRESSION: 1. Lines and tubes in stable position. 2. Persistent unchanged bilateral pulmonary infiltrates. ARDS/bilateral pneumonia could present this fashion.   Electronically Signed   By: Maisie Fus  Register   On: 06/03/2014 07:18   Dg Abd Portable 1v  06/03/2014   CLINICAL DATA:  Feeding tube placement tomorrow. Evaluate for barium in the colon.  EXAM: PORTABLE ABDOMEN - 1 VIEW  COMPARISON:  05/28/2014  FINDINGS: NG tube coils in the mid stomach.  Diffuse gaseous distention of bowel. No oral contrast material noted. No free air organomegaly.  IMPRESSION: Diffuse gaseous distention of bowel.  No oral contrast visualized.   Electronically Signed   By: Charlett Nose M.D.   On: 06/03/2014 17:03   ASSESSMENT / PLAN:  PULMONARY ET 3/16>>3/24 Trach 3/24>> A: Acute respiratory failure with ARDS 2nd to H1N1 and Pneumococcal PNA . Failure to wean from vent s/p trach. Tobacco abuse. P:   Changed to pressure control 3/26 >> decrease PC to 22 and RR to 16 on 3/30 Wean to trach collar as tolerated >> volume status and weakness seem to be issues preventing weaning F/u CXR, ABG Likely LTAC for rehab  CARDIOVASCULAR Lt IJ CVL 3/16 >> 3/27 RUE PICC 3/27 >>  A:  Sepsis 2nd to PNA >> resolved. Tachycardia - suspect fever related  Hx HTN, HLD. P:  ASA, zocor Lopressor 25 BID Lasix 40 mg BID x 2 doses   RENAL A:   AKI from sepsis >> resolved. Hypernatremia. Hypervolemia. Hyponatremia. P:   Lasix as above Free water to 200 ml Q6  Replace electrolytes as needed  GASTROINTESTINAL A:   Nutrition Dysphagia P:   TFs to goal Protonix for SUP IR consult for PEG placement   HEMATOLOGIC A:   Anemia of critical illness. P:  F/u CBC DVT: lovenox  INFECTIOUS A:   H1N1/Pneumococcal pneumonia >> completed Abx 3/25. New Fever 3/27 - replaced lines 3/27, ruled out DVT 3/29,  KUB wnl. P:   Pneumococcal Ag 3/15 >> positive Influenza PCR 3/15>> H1N1 pos Blood 3/22 >> neg Blood 3/28 >> Sputum 3/28 >> abundant staph >>  UA 3/28 >> neg HIV 3/29 >>   Vanco 3/29 >>  Zosyn 3/29 >> 3/30  ENDOCRINE A:   Hx of DM with Neuropathy, hypothyroidism. P:   Continue synthroid SSI Lantus to 60 units 3/29 TF coverage  NEUROLOGIC A:   Anxiety, insomnia, chronic pain. 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 P:   PT PRN fentanyl PRN ativan for anxiety     Likely would be good candidate for LTAC placement.   RN Case mgr working on placement.  Patient meets 21 days on 4/5 and will be eligible for transfer.      Canary Brim, NP-C [facility] Pgr: [phone] or [phone]   06/04/2014, 10:55 AM

## 2014-06-05 ENCOUNTER — Inpatient Hospital Stay (HOSPITAL_COMMUNITY): Payer: Commercial Managed Care - HMO

## 2014-06-05 LAB — BASIC METABOLIC PANEL
ANION GAP: 6 (ref 5–15)
BUN: 30 mg/dL — ABNORMAL HIGH (ref 6–23)
CHLORIDE: 113 mmol/L — AB (ref 96–112)
CO2: 27 mmol/L (ref 19–32)
Calcium: 8.3 mg/dL — ABNORMAL LOW (ref 8.4–10.5)
Creatinine, Ser: 0.76 mg/dL (ref 0.50–1.10)
GFR calc Af Amer: >90 mL/min (ref >90)
GFR calc non Af Amer: 86 mL/min — ABNORMAL LOW (ref >90)
GLUCOSE: 295 mg/dL — AB (ref 70–99)
Potassium: 4.1 mmol/L (ref 3.5–5.1)
Sodium: 146 mmol/L — ABNORMAL HIGH (ref 135–145)

## 2014-06-05 LAB — GLUCOSE, CAPILLARY
GLUCOSE-CAPILLARY: 243 mg/dL — AB (ref 70–99)
GLUCOSE-CAPILLARY: 313 mg/dL — AB (ref 70–99)
GLUCOSE-CAPILLARY: 162 mg/dL — AB (ref 70–99)
GLUCOSE-CAPILLARY: 221 mg/dL — AB (ref 70–99)
Glucose-Capillary: 249 mg/dL — ABNORMAL HIGH (ref 70–99)
Glucose-Capillary: 277 mg/dL — ABNORMAL HIGH (ref 70–99)

## 2014-06-05 LAB — CBC
HCT: 27.1 % — ABNORMAL LOW (ref 36.0–46.0)
HEMOGLOBIN: 7.9 g/dL — AB (ref 12.0–15.0)
MCH: 27.3 pg (ref 26.0–34.0)
MCHC: 29.2 g/dL — AB (ref 30.0–36.0)
MCV: 93.8 fL (ref 78.0–100.0)
PLATELETS: 481 10*3/uL — AB (ref 150–400)
RBC: 2.89 MIL/uL — AB (ref 3.87–5.11)
RDW: 19.3 % — ABNORMAL HIGH (ref 11.5–15.5)
WBC: 17.8 10*3/uL — ABNORMAL HIGH (ref 4.0–10.5)

## 2014-06-05 LAB — VANCOMYCIN, TROUGH: Vancomycin Tr: 12.6 ug/mL (ref 10.0–20.0)

## 2014-06-05 LAB — PROCALCITONIN: Procalcitonin: 0.34 ng/mL

## 2014-06-05 MED ORDER — INSULIN GLARGINE 100 UNIT/ML ~~LOC~~ SOLN
70.0000 [IU] | Freq: Every day | SUBCUTANEOUS | Status: DC
  Administered 2014-06-05 – 2014-06-09 (×5): 70 [IU] via SUBCUTANEOUS
  Filled 2014-06-05 (×6): qty 0.7

## 2014-06-05 MED ORDER — MUPIROCIN 2 % EX OINT
1.0000 "application " | TOPICAL_OINTMENT | Freq: Two times a day (BID) | CUTANEOUS | Status: AC
  Administered 2014-06-05 – 2014-06-09 (×10): 1 via NASAL
  Filled 2014-06-05: qty 22

## 2014-06-05 MED ORDER — CHLORHEXIDINE GLUCONATE CLOTH 2 % EX PADS
6.0000 | MEDICATED_PAD | Freq: Every day | CUTANEOUS | Status: AC
  Administered 2014-06-05 – 2014-06-09 (×5): 6 via TOPICAL

## 2014-06-05 MED ORDER — SODIUM CHLORIDE 0.9 % IV SOLN
500.0000 mg | Freq: Four times a day (QID) | INTRAVENOUS | Status: AC
  Administered 2014-06-05 – 2014-06-12 (×28): 500 mg via INTRAVENOUS
  Filled 2014-06-05 (×28): qty 500

## 2014-06-05 MED ORDER — VANCOMYCIN HCL IN DEXTROSE 750-5 MG/150ML-% IV SOLN
750.0000 mg | Freq: Two times a day (BID) | INTRAVENOUS | Status: DC
  Administered 2014-06-05: 750 mg via INTRAVENOUS
  Filled 2014-06-05: qty 150

## 2014-06-05 MED ORDER — VANCOMYCIN HCL IN DEXTROSE 1-5 GM/200ML-% IV SOLN
1000.0000 mg | Freq: Two times a day (BID) | INTRAVENOUS | Status: DC
  Administered 2014-06-06 – 2014-06-07 (×4): 1000 mg via INTRAVENOUS
  Filled 2014-06-05 (×3): qty 200

## 2014-06-05 NOTE — Progress Notes (Signed)
CARE MANAGEMENT NOTE 06/05/2014  Patient:  Grace Mitchell,Grace Mitchell   Account Number:  [id number]  Date Initiated:  05/21/2014  Documentation initiated by:  Barry Culverhouse  Subjective/Objective Assessment:   sepsis and influenza     Action/Plan:   home when stable   Anticipated DC Date:  06/08/2014   Anticipated DC Plan:  LONG TERM ACUTE CARE (LTAC)  In-house referral  NA      DC Planning Services  NA      [facility]  NA   Choice offered to / List presented to:             Status of service:  In process, will continue to follow Medicare Important Message given?   (If response is "NO", the following Medicare IM given date fields will be blank) Date Medicare IM given:   Medicare IM given by:   Date Additional Medicare IM given:   Additional Medicare IM given by:    Discharge Disposition:    Per UR Regulation:  Reviewed for med. necessity/level of care/duration of stay  If discussed at Long Length of Stay Meetings, dates discussed:   05/27/2014  05/29/2014  06/03/2014  06/05/2014    Comments:  June 05, 2014/Hazelyn Kallen L. Earlene Plateravis, RN, BSN, CCM. Case Management [facility] [phone] No discharge needs present of time of review. No changes in condition.  Temp up to 102.1  June 02, 2014/Captola Teschner L. Earlene Plateravis, RN, BSN, CCM. Case Management [facility] [phone] No discharge needs present of time of review. remains vent dep. on trache collar at 30%,ards by cxr, septic.  May 29, 2014/Korinne Greenstein L. Earlene Plateravis, RN, BSN, CCM. Case Management [facility] [phone] No discharge needs present of time of review.   May 28, 2014/Amahia Madonia L. Earlene Plateravis, RN, BSN, CCM. Case Management [place] [facility] [phone] Patient trached this am, request for [facility] to review for [possible transfer for continued care.  May 26, 2014/Brileigh Sevcik L. Earlene Plateravis, RN, BSN, CCM. Case Management [place] [phone] No discharge needs present of time of review. remains on the  vent, 50%fio2, hypotensive, agitated on propofol iv drip.  May 21, 2014/Mora Pedraza L. Earlene Plateravis, RN, BSN, CCM. Case Management [facility] [facility] [phone] No discharge needs present of time of review.

## 2014-06-05 NOTE — Progress Notes (Signed)
NUTRITION FOLLOW-UP  DOCUMENTATION CODES Per approved criteria  -Not Applicable   INTERVENTION: - Continue Vital AF 1.2 @ 65 ml/hr via OGT.  Tube feeding regimen provides 1872 kcal (100% of needs), 117 grams of protein, and 1264 ml of H2O.   NUTRITION DIAGNOSIS: Inadequate oral intake related to inability to eat as evidenced by NPO; ongoing  Goal: Pt to meet >/= 90% of their estimated nutrition needs; met with TF.   Monitor:  Weight trend, initiation and toleration of TF, labs/CBGs, vent status/settings  69 y.o. female  Admitting Dx: H1N1, pneumonia  ASSESSMENT: 69 y.o. F brought to [facility] ED 3/15 with 4 day hx of generalized body aches, SOB, subjective fevers, chills. In ED, had fever to 103, was placed on NRB for hypoxia and had RR in high 30's. PCCM called for admission due to concern for potential of deterioration.   3/16: - Pt intubated 3/16 - Spoke with husband who reports that pt as eating well until she started to get sick about 2 weeks ago. No significant weight loss. Pt's usual body weight is 165 lbs.  - No signs of fat or muscle wasting.   3/18: - Pt with H1N1 and Pneumonia. Running low grade fever.  - CBGs elevated- Lantus increased per MD note. Followed by Diabetes Coordinator.  - Pt fluid overloaded (+8.7 L since admission.) Wt increased 12 lb from 3/17 to 3/18. Re-weighed during RD visit to confirm. On Lasix. Per RN, urine output increasing.   3/23: Lurline Idol placed, failed to wean  3/31: - Per RN, pt tolerating TF - Peg on hold due to fever/staph infection  Patient is currently intubated on ventilator support MV: 13.0 L/min Temp (24hrs), Avg:101.1 F (38.4 C), Min:100.4 F (38 C), Max:102.7 F (39.3 C)  Propofol: none  Labs: CBG's elevated 243-313 Na elevated 146 BUN elevated 30  Height: Ht Readings from Last 1 Encounters:  05/29/14 '5\' 6"'  (1.676 m)   Weight: Wt Readings from Last 1 Encounters:  06/05/14 166 lb 3.6 oz (75.4 kg)   BMI:  Body  mass index is 26.84 kg/(m^2).  Estimated Nutritional Needs: Kcal: 6861 Protein: 100-115 g Fluid: 1.8 L/day  Skin: two small skin tears on sacrum  Diet Order: Diet NPO time specified  EDUCATION NEEDS: -Education not appropriate at this time   Intake/Output Summary (Last 24 hours) at 06/05/14 1404 Last data filed at 06/05/14 1210  Gross per 24 hour  Intake   2002 ml  Output   1795 ml  Net    207 ml    Last BM: 3/29  Labs:   Recent Labs Lab 05/30/14 0325  06/03/14 0400 06/04/14 0452 06/05/14 0440  NA 147*  < > 149* 148* 146*  K 4.0  < > 3.7 3.7 4.1  CL 105  < > 112 113* 113*  CO2 34*  < > '30 28 27  ' BUN 30*  < > 34* 26* 30*  CREATININE 0.65  < > 0.67 0.72 0.76  CALCIUM 8.2*  < > 8.3* 8.3* 8.3*  MG 2.1  --   --   --   --   PHOS 3.5  --   --   --   --   GLUCOSE 196*  < > 198* 114* 295*  < > = values in this interval not displayed.  CBG (last 3)   Recent Labs  06/05/14 0419 06/05/14 0821 06/05/14 1146  GLUCAP 277* 243* 313*    Scheduled Meds: . amitriptyline  10 mg Oral QHS  .  antiseptic oral rinse  7 mL Mouth Rinse QID  . aspirin  81 mg Oral Daily  . bacitracin  1 application Topical BID  . chlorhexidine  15 mL Mouth Rinse BID  . Chlorhexidine Gluconate Cloth  6 each Topical Q0600  . enoxaparin (LOVENOX) injection  40 mg Subcutaneous Q24H  . free water  200 mL Per Tube Q6H  . imipenem-cilastatin  500 mg Intravenous 4 times per day  . insulin aspart  0-20 Units Subcutaneous 6 times per day  . insulin aspart  3 Units Subcutaneous 6 times per day  . insulin glargine  70 Units Subcutaneous Daily  . levothyroxine  150 mcg Per Tube QAC breakfast  . metoprolol tartrate  25 mg Oral BID  . mupirocin ointment  1 application Nasal BID  . pantoprazole sodium  40 mg Per Tube Q24H  . simvastatin  20 mg Per Tube QPM  . sodium chloride  10-40 mL Intracatheter Q12H  . vancomycin  750 mg Intravenous Q12H    Continuous Infusions: . feeding supplement (VITAL AF  1.2 CAL) 1,000 mL (06/05/14 9927)    Past Medical History  Diagnosis Date  . Diabetes mellitus   . Hypothyroidism   . Hypertension   . Hyperlipidemia     Past Surgical History  Procedure Laterality Date  . Back surgery      Laurette Schimke [place], [place], [place]

## 2014-06-05 NOTE — Progress Notes (Signed)
ANTIBIOTIC CONSULT NOTE - INITIAL  Pharmacy Consult for Vancomycin and Primaxin Indication: pneumonia  No Known Allergies  Patient Measurements: Height: 5\' 6"  (167.6 cm) Weight: 166 lb 3.6 oz (75.4 kg) IBW/kg (Calculated) : 59.3  Vital Signs: Temp: 100.6 F (38.1 C) (03/31 0800) Temp Source: Core (Comment) (03/31 0800) BP: 139/60 mmHg (03/31 0800) Pulse Rate: 108 (03/31 0312) Intake/Output from previous day: 03/30 0701 - 03/31 0700 In: 1200 [I.V.:120; NG/GT:780; IV Piggyback:300] Out: 1795 [Urine:1795] Intake/Output from this shift: Total I/O In: 475 [I.V.:20; NG/GT:455] Out: -   Labs:  Recent Labs  06/03/14 0400 06/04/14 0452 06/05/14 0440  WBC 18.0* 15.3* 17.8*  HGB 8.1* 7.9* 7.9*  PLT 417* 450* 481*  CREATININE 0.67 0.72 0.76   Estimated Creatinine Clearance: 71.7 mL/min (by C-G formula based on Cr of 0.76). No results for input(s): VANCOTROUGH, VANCOPEAK, VANCORANDOM, GENTTROUGH, GENTPEAK, GENTRANDOM, TOBRATROUGH, TOBRAPEAK, TOBRARND, AMIKACINPEAK, AMIKACINTROU, AMIKACIN in the last 72 hours.   Microbiology: Recent Results (from the past 720 hour(s))  Urine culture     Status: None   Collection Time: 05/20/14  3:49 PM  Result Value Ref Range Status   Specimen Description URINE, CLEAN CATCH  Final   Special Requests NONE  Final   Colony Count   Final    30,000 COLONIES/ML Performed at [facility]    Culture   Final    Multiple bacterial morphotypes present, none predominant. Suggest appropriate recollection if clinically indicated. Performed at [facility]    Report Status 05/21/2014 FINAL  Final  MRSA PCR Screening     Status: None   Collection Time: 05/20/14  4:04 PM  Result Value Ref Range Status   MRSA by PCR NEGATIVE NEGATIVE Final    Comment:        The GeneXpert MRSA Assay (FDA approved for NASAL specimens only), is one component of a comprehensive MRSA colonization surveillance program. It is not intended to diagnose  MRSA infection nor to guide or monitor treatment for MRSA infections.   Blood Culture (routine x 2)     Status: None   Collection Time: 05/20/14  4:26 PM  Result Value Ref Range Status   Specimen Description BLOOD LEFT ANTECUBITAL  Final   Special Requests BOTTLES DRAWN AEROBIC AND ANAEROBIC  Final   Culture   Final    NO GROWTH 5 DAYS Performed at [facility]    Report Status 05/26/2014 FINAL  Final  Blood Culture (routine x 2)     Status: None   Collection Time: 05/20/14  4:34 PM  Result Value Ref Range Status   Specimen Description BLOOD LEFT HAND  Final   Special Requests BOTTLES DRAWN AEROBIC AND ANAEROBIC 4CC  Final   Culture   Final    NO GROWTH 5 DAYS Performed at [facility]    Report Status 05/26/2014 FINAL  Final  Culture, respiratory (NON-Expectorated)     Status: None   Collection Time: 05/21/14 12:32 PM  Result Value Ref Range Status   Specimen Description TRACHEAL ASPIRATE  Final   Special Requests Normal  Final   Gram Stain   Final    FEW WBC PRESENT,BOTH PMN AND MONONUCLEAR RARE SQUAMOUS EPITHELIAL CELLS PRESENT NO ORGANISMS SEEN Performed at [facility]    Culture   Final    NO GROWTH 2 DAYS Performed at [facility]    Report Status 05/23/2014 FINAL  Final  Clostridium Difficile by PCR     Status: None  Collection Time: 05/23/14  8:56 AM  Result Value Ref Range Status   C difficile by pcr NEGATIVE NEGATIVE Final  Culture, respiratory (NON-Expectorated)     Status: None   Collection Time: 05/27/14 10:00 AM  Result Value Ref Range Status   Specimen Description TRACHEAL ASPIRATE  Final   Special Requests Normal  Final   Gram Stain   Final    ABUNDANT WBC PRESENT, PREDOMINANTLY PMN NO SQUAMOUS EPITHELIAL CELLS SEEN NO ORGANISMS SEEN Performed at [facility]    Culture   Final    FEW CANDIDA ALBICANS Performed at [facility]    Report Status 05/29/2014 FINAL  Final  Culture, Urine      Status: None   Collection Time: 05/27/14 10:00 AM  Result Value Ref Range Status   Specimen Description URINE, CATHETERIZED  Final   Special Requests Normal  Final   Colony Count NO GROWTH Performed at [facility]   Final   Culture NO GROWTH Performed at [facility]   Final   Report Status 05/28/2014 FINAL  Final  Culture, blood (routine x 2)     Status: None   Collection Time: 05/27/14 10:35 AM  Result Value Ref Range Status   Specimen Description BLOOD RIGHT HAND  Final   Special Requests BOTTLES DRAWN AEROBIC ONLY 3CC  Final   Culture   Final    NO GROWTH 5 DAYS Performed at [facility]    Report Status 06/02/2014 FINAL  Final  Culture, blood (routine x 2)     Status: None   Collection Time: 05/27/14 10:45 AM  Result Value Ref Range Status   Specimen Description BLOOD RIGHT ARM  Final   Special Requests BOTTLES DRAWN AEROBIC AND ANAEROBIC 6CC  Final   Culture   Final    NO GROWTH 5 DAYS Performed at [facility]    Report Status 06/02/2014 FINAL  Final  Culture, respiratory (NON-Expectorated)     Status: None (Preliminary result)   Collection Time: 06/02/14 10:41 AM  Result Value Ref Range Status   Specimen Description TRACHEAL ASPIRATE  Final   Special Requests Normal  Final   Gram Stain   Final    ABUNDANT WBC PRESENT, PREDOMINANTLY PMN NO SQUAMOUS EPITHELIAL CELLS SEEN MODERATE GRAM POSITIVE COCCI IN PAIRS IN CLUSTERS FEW GRAM NEGATIVE RODS Performed at [facility]    Culture   Final    ABUNDANT METHICILLIN RESISTANT STAPHYLOCOCCUS AUREUS Note: RIFAMPIN AND GENTAMICIN SHOULD NOT BE USED AS SINGLE DRUGS FOR TREATMENT OF STAPH INFECTIONS. This organism DOES NOT demonstrate inducible Clindamycin resistance in vitro. RVB RUBY J. 3/31  BY REAMM ESCHERICHIA COLI Performed at [facility]    Report Status PENDING  Incomplete   Organism ID, Bacteria METHICILLIN RESISTANT STAPHYLOCOCCUS AUREUS  Final   Organism  ID, Bacteria ESCHERICHIA COLI  Final      Susceptibility   Escherichia coli - MIC*    AMPICILLIN >=32 RESISTANT Resistant     AMPICILLIN/SULBACTAM 16 INTERMEDIATE Intermediate     CEFAZOLIN >=64 RESISTANT Resistant     CIPROFLOXACIN >=4 RESISTANT Resistant     GENTAMICIN <=1 SENSITIVE Sensitive     IMIPENEM <=0.25 SENSITIVE Sensitive     PIP/TAZO <=4 SENSITIVE Sensitive     TOBRAMYCIN <=1 SENSITIVE Sensitive     TRIMETH/SULFA <=20 SENSITIVE Sensitive     * ESCHERICHIA COLI   Methicillin resistant staphylococcus aureus - MIC*    CLINDAMYCIN <=0.25 SENSITIVE Sensitive  ERYTHROMYCIN >=8 RESISTANT Resistant     GENTAMICIN <=0.5 SENSITIVE Sensitive     LEVOFLOXACIN 4 INTERMEDIATE Intermediate     OXACILLIN >=4 RESISTANT Resistant     PENICILLIN >=0.5 RESISTANT Resistant     RIFAMPIN <=0.5 SENSITIVE Sensitive     TRIMETH/SULFA <=10 SENSITIVE Sensitive     VANCOMYCIN 1 SENSITIVE Sensitive     TETRACYCLINE <=1 SENSITIVE Sensitive     * ABUNDANT METHICILLIN RESISTANT STAPHYLOCOCCUS AUREUS  Culture, blood (routine x 2)     Status: None (Preliminary result)   Collection Time: 06/02/14 10:50 AM  Result Value Ref Range Status   Specimen Description BLOOD LEFT HAND  Final   Special Requests BOTTLES DRAWN AEROBIC AND ANAEROBIC 6 CC EA  Final   Culture   Final           BLOOD CULTURE RECEIVED NO GROWTH TO DATE CULTURE WILL BE HELD FOR 5 DAYS BEFORE ISSUING A FINAL NEGATIVE REPORT [facility]    Report Status PENDING  Incomplete  Culture, blood (routine x 2)     Status: None (Preliminary result)   Collection Time: 06/02/14 11:00 AM  Result Value Ref Range Status   Specimen Description BLOOD LEFT ARM  Final   Special Requests BOTTLES DRAWN AEROBIC AND ANAEROBIC 5 CC BLUE  Final   Culture   Final           BLOOD CULTURE RECEIVED NO GROWTH TO DATE CULTURE WILL BE HELD FOR 5 DAYS BEFORE ISSUING A FINAL NEGATIVE REPORT [facility]    Report Status  PENDING  Incomplete    Medical History: Past Medical History  Diagnosis Date  . Diabetes mellitus   . Hypothyroidism   . Hypertension   . Hyperlipidemia      Assessment: 69 yo female smoker admitted 3/15 with H1N1 influenza & pneumococcal PNA and sepsis.  Patient completed Tamiflu and antibiotic course last week; however, patient has had continued fevers this week off of antibiotics.  Trach aspirate culture collected 3/28, Vancomycin/Zosyn resumed 3/19.  Today, culture showing MRSA and E.coli and antibiotics are being adjusted to Vancomycin and Primaxin.  3/15 >> Vanc >> 3/15; restart 3/22 >> 3/25, restart 3/29 >> 3/15 >> Zosyn>> 3/15, restart 3/29 >> 3/30 3/15 >> Ceftriaxone >> 3/15 >> Azithromycin >> 3/19 3/15 >> Tamiflu >> 3/20 3/31 >> Primaxin >>  Tmax: 102.7 (APAP) WBCs: elevated Renal: Scr WNL/stable, CrCl~78 ml/min (normalized) PCT 6.94 > 9.26 (3/16) > 0.19 (3/29) > 0.19 (3/30) > 0.34 (3/31) LA 3.31 > 2.5 (3/15)  3/15 blood x 2: NGF 3/16 urine: 30K cfu/ml, no predominant species 3/16 influenza panel: + influenza A, H1N1 3/15 strep pneumo ur ag: POSITIVE 3/15 legionella ur ag: neg 3/16 trach asp cx: ng - final 3/18 cdiff negative 3/22 bcx x2: NGF 3/22 ucx: NGF 3/22 TA: few candida FINAL 3/28 blood: ngtd 3/28 trach asp: MRSA, E.Coli  Drug level / dose changes info: 3/24: 0900 VT = 23.5 on 1g q12h (reduce to 750mg  q12h)  Today, 3/31: Day #3 Vanc 750mg  q12h for MRSA pneumonia.  Zosyn was discontinued yesterday since preliminary trach aspirate culture was showing abundant staph only. Culture is now showing E.coli as well (R-ampicillin, cefazolin, cipro; I-Unasyn; S-Zosyn, Gent, Primaxin, Septra).  Primaxin added.  Patient only received 2 doses of Zosyn when it was restarted so wouldn't necessarily consider treatment failure but considering resistance to other beta-lactams (although not identified as an ESBL) and increase in PCT (suggesting escalation in  therapy),  agree with starting with Primaxin at this time. Vancomycin trough level today.  Goal of Therapy:  Vancomycin trough level 15-20 mcg/ml  Doses adjusted per renal function Eradication of infection  Plan:  1.  Vancomycin trough this evening.   2.  Primaxin 500 mg IV q6h.   Clance Boll 06/05/2014,10:06 AM

## 2014-06-05 NOTE — Progress Notes (Signed)
  Results for Jackquline BoschRICH, Lynn K (MRN [id number]) as of 06/05/2014 10:23  Ref. Range 06/04/2014 23:32 06/05/2014 04:19 06/05/2014 08:21  Glucose-Capillary Latest Range: 70-99 mg/dL 086249 (H) 578277 (H) 469243 (H)   Blood sugars > 180 mg/dL. Lantus increased to 70 units QD. Consider increasing TF coverage to Novolog 4 units Q4H.  Will continue to follow. Thank you. Ailene Ardshonda Belinda Schlichting, RD, LDN, CDE Inpatient Diabetes Coordinator [phone]

## 2014-06-05 NOTE — Progress Notes (Signed)
Pharmacy Consult for Vancomycin  Indication: pneumonia  See previous note from Clance BollAmanda Runyon, PharmD for full details.  Vancomycin trough = 12.6 mcg/ml which is below goal of 15-20 mcg/ml  Plan:  Increase vancomycin to 1g IV q12h  Recheck trough prior to 4th dose of new regimen  Loralee PacasErin Trichelle Lehan, PharmD, BCPS Pager: [phone] 06/05/2014 5:50 PM

## 2014-06-05 NOTE — Progress Notes (Signed)
PULMONARY / CRITICAL CARE MEDICINE   Name: Grace BoschLinda K Guerrini MRN: [id number] DOB: [birth year]    ADMISSION DATE:  05/20/2014 CONSULTATION DATE:  06/05/2014  REFERRING MD :  EDP  CHIEF COMPLAINT:  Body aches, SOB, chills, fever  INITIAL PRESENTATION:  69 y/o female smoker with dyspnea, fever (Tm103F), chills, malaise from H1N1 influenza & pneumococcal PNA and sepsis.  STUDIES:  3/30  LE Doppler >> Negative for DVT bilaterally   SIGNIFICANT EVENTS: 3/15  admit 3/16  ETT for resp failure 3/17  paralytic for worsening pO2/FIO2 3/26  change to pressure control 3/27  Fever >> change line 3/28  Attempted PSV wean, failed with tachypnea and low lung volumes 3/29  Initially fever improved, then spike to 102 3/30 PEG on hold for fever/staph infection  SUBJECTIVE:   RN reports no acute events.  Pending PEG (? If will proceed with ongoing fever without source)   VITAL SIGNS: Temp:  [100.2 F (37.9 C)-102.9 F (39.4 C)] 100.9 F (38.3 C) (03/31 0400) Pulse Rate:  [98-111] 108 (03/31 0312) Resp:  [16-35] 29 (03/31 0400) BP: (100-153)/(44-118) 126/57 mmHg (03/31 0400) SpO2:  [98 %-100 %] 100 % (03/31 0400) FiO2 (%):  [30 %] 30 % (03/31 0735) Weight:  [166 lb 3.6 oz (75.4 kg)] 166 lb 3.6 oz (75.4 kg) (03/31 0435)   INTAKE / OUTPUT: Intake/Output      03/30 0701 - 03/31 0700 03/31 0701 - 04/01 0700   I.V. (mL/kg) 120 (1.6)    NG/GT 780    IV Piggyback 300    Total Intake(mL/kg) 1200 (15.9)    Urine (mL/kg/hr) 1795 (1)    Stool     Total Output 1795     Net -595            PHYSICAL EXAMINATION: General: ill appearing female in no distress, currently full vent support Neuro: follows commands, diffusely weak, able to raise arms off bed to command HEENT: Trach site c/d/i Cardiovascular: regular, tachycardic Lungs: even/non-labored, lungs bilaterally with coarse rhonchi Abdomen: soft, non tender Musculoskeletal: 1+ edema Skin: no rashes, R wrist with blistering, appearance of  possible adhesive tear (had a R aline)  LABS:  CBC  Recent Labs Lab 06/03/14 0400 06/04/14 0452 06/05/14 0440  WBC 18.0* 15.3* 17.8*  HGB 8.1* 7.9* 7.9*  HCT 28.3* 26.8* 27.1*  PLT 417* 450* 481*   Coag's No results for input(s): APTT, INR in the last 168 hours.   BMET  Recent Labs Lab 06/03/14 0400 06/04/14 0452 06/05/14 0440  NA 149* 148* 146*  K 3.7 3.7 4.1  CL 112 113* 113*  CO2 30 28 27   BUN 34* 26* 30*  CREATININE 0.67 0.72 0.76  GLUCOSE 198* 114* 295*   Electrolytes  Recent Labs Lab 05/30/14 0325  06/03/14 0400 06/04/14 0452 06/05/14 0440  CALCIUM 8.2*  < > 8.3* 8.3* 8.3*  MG 2.1  --   --   --   --   PHOS 3.5  --   --   --   --   < > = values in this interval not displayed.   ABG  Recent Labs Lab 05/31/14 1610 06/02/14 0435  PHART 7.577* 7.503*  PCO2ART 34.9* 38.6  PO2ART 90.5 114.0*   Liver Enzymes No results for input(s): AST, ALT, ALKPHOS, BILITOT, ALBUMIN in the last 168 hours.   Glucose  Recent Labs Lab 06/04/14 0309 06/04/14 0759 06/04/14 1125 06/04/14 1222 06/04/14 1600 06/04/14 1955  GLUCAP 137* 80 64* 94 116* 190*  Imaging Dg Chest Port 1 View  06/05/2014   CLINICAL DATA:  Respiratory failure  EXAM: PORTABLE CHEST - 1 VIEW  COMPARISON:  06/03/2014  FINDINGS: Tracheostomy in good position. Right arm PICC tip in the SVC unchanged. NG tube in the stomach.  Diffuse bilateral airspace disease unchanged from the prior study. Negative for pneumothorax or effusion.  IMPRESSION: Diffuse bilateral airspace disease unchanged from prior studies. No new findings.   Electronically Signed   By: Marlan Palau M.D.   On: 06/05/2014 07:38   Dg Abd Portable 1v  06/03/2014   CLINICAL DATA:  Feeding tube placement tomorrow. Evaluate for barium in the colon.  EXAM: PORTABLE ABDOMEN - 1 VIEW  COMPARISON:  05/28/2014  FINDINGS: NG tube coils in the mid stomach. Diffuse gaseous distention of bowel. No oral contrast material noted. No free air  organomegaly.  IMPRESSION: Diffuse gaseous distention of bowel.  No oral contrast visualized.   Electronically Signed   By: Charlett Nose M.D.   On: 06/03/2014 17:03   ASSESSMENT / PLAN:  PULMONARY ET 3/16>>3/24 Trach 3/24>> A: Acute respiratory failure with ARDS 2nd to H1N1 and Pneumococcal PNA . Failure to wean from vent s/p trach. Tobacco abuse. P:   Changed to pressure control 3/26 >> decrease PC to 22 and RR to 16 on 3/30 Wean to trach collar as tolerated >> volume status and weakness(cip) seem to be issues preventing weaning F/u CXR, ABG Likely LTAC for rehab  CARDIOVASCULAR Lt IJ CVL 3/16 >> 3/27 RUE PICC 3/27 >>  A:  Sepsis 2nd to PNA >> resolved. Tachycardia - suspect fever related  Hx HTN, HLD. P:  ASA, zocor Lopressor 25 BID   RENAL Lab Results  Component Value Date   CREATININE 0.76 06/05/2014   CREATININE 0.72 06/04/2014   CREATININE 0.67 06/03/2014    Recent Labs Lab 06/03/14 0400 06/04/14 0452 06/05/14 0440  K 3.7 3.7 4.1     Recent Labs Lab 06/03/14 0400 06/04/14 0452 06/05/14 0440  NA 149* 148* 146*    A:   AKI from sepsis >> resolved. Hypernatremia.(improving) Hypervolemia. . P:   Lasix as above Free water to 200 ml Q6  Replace electrolytes as needed  GASTROINTESTINAL A:   Nutrition Dysphagia P:   TFs to goal Protonix for SUP IR consult for PEG placement on hold for fever staph infection  HEMATOLOGIC A:   Anemia of critical illness. P:  F/u CBC DVT: lovenox  INFECTIOUS A:   H1N1/Pneumococcal pneumonia >> completed Abx 3/25. New Fever 3/27 - replaced lines 3/27, ruled out DVT 3/29,  KUB wnl. P:   Pneumococcal Ag 3/15 >> positive Influenza PCR 3/15>> H1N1 pos Blood 3/22 >> neg Blood 3/28 >> Sputum 3/28 >> abundant staph >>  UA 3/28 >> neg HIV 3/29 >> nr  Vanco 3/29 >>  Zosyn 3/29 >> 3/30  ENDOCRINE CBG (last 3)   Recent Labs  06/04/14 1222 06/04/14 1600 06/04/14 1955  GLUCAP 94 116* 190*     A:    Hx of DM with Neuropathy, hypothyroidism. P:   Continue synthroid SSI Lantus to 70 units 3/31 TF coverage  NEUROLOGIC A:   Anxiety, insomnia, chronic pain. 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 P:   PT PRN fentanyl PRN ativan for anxiety     Likely would be good candidate for LTAC placement.  RN Case mgr working on placement.  Patient meets 21 days on 4/5 and will be eligible for transfer.  Brett Canales Catcher Dehoyos ACNP [facility] PCCM Pager [phone] till 3 pm If no answer page [phone] 06/05/2014, 8:08 AM

## 2014-06-06 DIAGNOSIS — J15212 Pneumonia due to Methicillin resistant Staphylococcus aureus: Secondary | ICD-10-CM

## 2014-06-06 DIAGNOSIS — A498 Other bacterial infections of unspecified site: Secondary | ICD-10-CM

## 2014-06-06 DIAGNOSIS — Z1612 Extended spectrum beta lactamase (ESBL) resistance: Secondary | ICD-10-CM

## 2014-06-06 DIAGNOSIS — J95851 Ventilator associated pneumonia: Secondary | ICD-10-CM

## 2014-06-06 LAB — CULTURE, RESPIRATORY W GRAM STAIN: Special Requests: NORMAL

## 2014-06-06 LAB — GLUCOSE, CAPILLARY
GLUCOSE-CAPILLARY: 164 mg/dL — AB (ref 70–99)
Glucose-Capillary: 184 mg/dL — ABNORMAL HIGH (ref 70–99)
Glucose-Capillary: 199 mg/dL — ABNORMAL HIGH (ref 70–99)
Glucose-Capillary: 213 mg/dL — ABNORMAL HIGH (ref 70–99)
Glucose-Capillary: 164 mg/dL — ABNORMAL HIGH (ref 70–99)
Glucose-Capillary: 205 mg/dL — ABNORMAL HIGH (ref 70–99)

## 2014-06-06 LAB — CULTURE, RESPIRATORY

## 2014-06-06 LAB — BASIC METABOLIC PANEL
ANION GAP: 7 (ref 5–15)
BUN: 26 mg/dL — ABNORMAL HIGH (ref 6–23)
CO2: 26 mmol/L (ref 19–32)
Calcium: 8.1 mg/dL — ABNORMAL LOW (ref 8.4–10.5)
Chloride: 111 mmol/L (ref 96–112)
Creatinine, Ser: 0.68 mg/dL (ref 0.50–1.10)
GFR, EST NON AFRICAN AMERICAN: 89 mL/min — AB (ref >90)
Glucose, Bld: 208 mg/dL — ABNORMAL HIGH (ref 70–99)
POTASSIUM: 3.3 mmol/L — AB (ref 3.5–5.1)
Sodium: 144 mmol/L (ref 135–145)

## 2014-06-06 LAB — CBC
HEMATOCRIT: 25.6 % — AB (ref 36.0–46.0)
HEMOGLOBIN: 7.4 g/dL — AB (ref 12.0–15.0)
MCH: 26.9 pg (ref 26.0–34.0)
MCHC: 28.9 g/dL — ABNORMAL LOW (ref 30.0–36.0)
MCV: 93.1 fL (ref 78.0–100.0)
Platelets: 504 10*3/uL — ABNORMAL HIGH (ref 150–400)
RBC: 2.75 MIL/uL — AB (ref 3.87–5.11)
RDW: 19 % — ABNORMAL HIGH (ref 11.5–15.5)
WBC: 16.6 10*3/uL — AB (ref 4.0–10.5)

## 2014-06-06 LAB — MAGNESIUM: MAGNESIUM: 2.2 mg/dL (ref 1.5–2.5)

## 2014-06-06 LAB — PHOSPHORUS: PHOSPHORUS: 2.8 mg/dL (ref 2.3–4.6)

## 2014-06-06 LAB — CRYOGLOBULIN

## 2014-06-06 MED ORDER — FUROSEMIDE 10 MG/ML IJ SOLN
40.0000 mg | Freq: Two times a day (BID) | INTRAMUSCULAR | Status: AC
  Administered 2014-06-06 – 2014-06-07 (×2): 40 mg via INTRAVENOUS
  Filled 2014-06-06 (×2): qty 4

## 2014-06-06 MED ORDER — POTASSIUM CHLORIDE 20 MEQ/15ML (10%) PO SOLN
40.0000 meq | Freq: Two times a day (BID) | ORAL | Status: AC
  Administered 2014-06-06 (×2): 40 meq
  Filled 2014-06-06 (×2): qty 30

## 2014-06-06 NOTE — Progress Notes (Signed)
O.K. Per MD for RT to take out sutures out of trach. Pt had no complications or bleeding.  

## 2014-06-06 NOTE — Progress Notes (Signed)
PULMONARY / CRITICAL CARE MEDICINE   Name: Grace BoschLinda K Becraft MRN: [id number] DOB: 1945-12-07    ADMISSION DATE:  05/20/2014 CONSULTATION DATE:  06/06/2014  REFERRING MD :  EDP  CHIEF COMPLAINT:  Body aches, SOB, chills, fever  INITIAL PRESENTATION:  69 y/o female smoker with dyspnea, fever (Tm103F), chills, malaise from H1N1 influenza & pneumococcal PNA and sepsis.  STUDIES:  3/30  LE Doppler >> Negative for DVT bilaterally   SIGNIFICANT EVENTS: 3/15  admit 3/16  ETT for resp failure 3/17  paralytic for worsening pO2/FIO2 3/26  change to pressure control 3/27  Fever >> change line 3/28  Attempted PSV wean, failed with tachypnea and low lung volumes 3/29  Initially fever improved, then spike to 102 3/30  PEG on hold for fever/staph infection 4/01  Weaning on 10/5, noted culture results   SUBJECTIVE:   RN reports no acute events, out of bed to chair! With lift    VITAL SIGNS: Temp:  [99.7 F (37.6 C)-102.9 F (39.4 C)] 99.7 F (37.6 C) (04/01 1000) Pulse Rate:  [90-112] 90 (03/31 2305) Resp:  [17-32] 31 (04/01 1000) BP: (96-138)/(37-64) 131/60 mmHg (04/01 1000) SpO2:  [100 %] 100 % (04/01 1000) FiO2 (%):  [30 %] 30 % (04/01 1123) Weight:  [172 lb 2.9 oz (78.1 kg)] 172 lb 2.9 oz (78.1 kg) (04/01 0500)   INTAKE / OUTPUT: Intake/Output      03/31 0701 - 04/01 0700 04/01 0701 - 04/02 0700   I.V. (mL/kg) 52 (0.7)    NG/GT 1850 505   IV Piggyback 750    Total Intake(mL/kg) 2652 (34) 505 (6.5)   Urine (mL/kg/hr) 1455 (0.8)    Total Output 1455     Net +1197 +505          PHYSICAL EXAMINATION: General: ill appearing female in no distress, currently full vent support Neuro: follows commands, diffusely weak, able to raise arms off bed to command HEENT: Trach site c/d/i Cardiovascular: regular, tachycardic Lungs: even/non-labored, lungs bilaterally with coarse rhonchi Abdomen: soft, non tender Musculoskeletal: 1+ edema Skin: no rashes, R wrist with blistering (improved  4/1), appearance of possible adhesive tear (had a R aline)  LABS:  CBC  Recent Labs Lab 06/04/14 0452 06/05/14 0440 06/06/14 0530  WBC 15.3* 17.8* 16.6*  HGB 7.9* 7.9* 7.4*  HCT 26.8* 27.1* 25.6*  PLT 450* 481* 504*   BMET  Recent Labs Lab 06/04/14 0452 06/05/14 0440 06/06/14 0530  NA 148* 146* 144  K 3.7 4.1 3.3*  CL 113* 113* 111  CO2 28 27 26   BUN 26* 30* 26*  CREATININE 0.72 0.76 0.68  GLUCOSE 114* 295* 208*   Electrolytes  Recent Labs Lab 06/04/14 0452 06/05/14 0440 06/06/14 0530  CALCIUM 8.3* 8.3* 8.1*  MG  --   --  2.2  PHOS  --   --  2.8    ABG  Recent Labs Lab 05/31/14 1610 06/02/14 0435  PHART 7.577* 7.503*  PCO2ART 34.9* 38.6  PO2ART 90.5 114.0*    Glucose  Recent Labs Lab 06/05/14 0821 06/05/14 1146 06/05/14 1647 06/05/14 2022 06/06/14 0103 06/06/14 0416  GLUCAP 243* 313* 162* 221* 184* 164*    Imaging Dg Chest Port 1 View  06/05/2014   CLINICAL DATA:  Respiratory failure  EXAM: PORTABLE CHEST - 1 VIEW  COMPARISON:  06/03/2014  FINDINGS: Tracheostomy in good position. Right arm PICC tip in the SVC unchanged. NG tube in the stomach.  Diffuse bilateral airspace disease unchanged from the prior  study. Negative for pneumothorax or effusion.  IMPRESSION: Diffuse bilateral airspace disease unchanged from prior studies. No new findings.   Electronically Signed   By: Marlan Palau M.D.   On: 06/05/2014 07:38   ASSESSMENT / PLAN:  PULMONARY ET 3/16>>3/24 Trach 3/24>> A: Acute respiratory failure with ARDS 2nd to H1N1 and Pneumococcal PNA . Failure to wean from vent s/p trach. Tobacco abuse. P:   Changed to pressure control 3/26 >> decrease PC to 20 and RR to 16 on 4/1 SBT / WUA daily Wean to trach collar as tolerated >> volume status and weakness seem to be issues preventing weaning F/u CXR intermittently  Likely LTAC for rehab  CARDIOVASCULAR Lt IJ CVL 3/16 >> 3/27 RUE PICC 3/27 >>  A:  Sepsis 2nd to PNA >>  resolved. Tachycardia - suspect fever related  Hx HTN, HLD. P:  ASA, zocor Lopressor 25 BID Lasix 40 mb BID x2 doses 4/1   RENAL A:   AKI from sepsis >> resolved. Hypernatremia - improving Hypokalemia  Hypervolemia. P:   Free water to 200 ml Q6  Replace electrolytes as needed KCL with lasix 4/1   GASTROINTESTINAL A:   Nutrition Dysphagia P:   TFs to goal Protonix for SUP IR consult for PEG placement on hold for fever + staph/E-Coli infection  HEMATOLOGIC A:   Anemia of critical illness. P:  F/u CBC DVT: lovenox  INFECTIOUS A:   H1N1/Pneumococcal pneumonia >> completed Abx 3/25. New Fever 3/27 - replaced lines 3/27, ruled out DVT 3/29,  KUB wnl.   P:   Pneumococcal Ag 3/15 >> positive Influenza PCR 3/15>> H1N1 pos Blood 3/22 >> neg Blood 3/28 >> Sputum 3/28 >> MRSA (sens vanco)+ E-Coli (sens imipenem) UA 3/28 >> neg HIV 3/29 >> nr  Vanco 3/29 >>  Zosyn 3/29 >> 3/30 Imipenem 3/31 >>  ENDOCRINE CBG (last 3)  A:   Hx of DM with Neuropathy, hypothyroidism. P:   Continue synthroid SSI Lantus to 70 units 3/31 TF coverage  NEUROLOGIC A:   Anxiety, insomnia, chronic pain. 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 P:   PT PRN fentanyl PRN ativan for anxiety     Likely would be good candidate for LTAC placement.  RN Case mgr working on placement.  Patient meets 21 days on 4/5 and will be eligible for transfer.      Canary Brim, NP-C [facility] Pgr: [phone] or [phone]    06/06/2014, 12:43 PM

## 2014-06-07 ENCOUNTER — Inpatient Hospital Stay (HOSPITAL_COMMUNITY): Payer: Commercial Managed Care - HMO

## 2014-06-07 LAB — MAGNESIUM: Magnesium: 1.9 mg/dL (ref 1.5–2.5)

## 2014-06-07 LAB — CBC
HCT: 26 % — ABNORMAL LOW (ref 36.0–46.0)
HEMOGLOBIN: 7.5 g/dL — AB (ref 12.0–15.0)
MCH: 26.7 pg (ref 26.0–34.0)
MCHC: 28.8 g/dL — ABNORMAL LOW (ref 30.0–36.0)
MCV: 92.5 fL (ref 78.0–100.0)
PLATELETS: 530 10*3/uL — AB (ref 150–400)
RBC: 2.81 MIL/uL — ABNORMAL LOW (ref 3.87–5.11)
RDW: 18.1 % — ABNORMAL HIGH (ref 11.5–15.5)
WBC: 13.9 10*3/uL — AB (ref 4.0–10.5)

## 2014-06-07 LAB — BASIC METABOLIC PANEL
Anion gap: 12 (ref 5–15)
BUN: 23 mg/dL (ref 6–23)
CO2: 25 mmol/L (ref 19–32)
Calcium: 7.8 mg/dL — ABNORMAL LOW (ref 8.4–10.5)
Chloride: 106 mmol/L (ref 96–112)
Creatinine, Ser: 0.61 mg/dL (ref 0.50–1.10)
GFR calc non Af Amer: >90 mL/min (ref >90)
GLUCOSE: 223 mg/dL — AB (ref 70–99)
Potassium: 3.8 mmol/L (ref 3.5–5.1)
SODIUM: 143 mmol/L (ref 135–145)

## 2014-06-07 LAB — GLUCOSE, CAPILLARY
Glucose-Capillary: 200 mg/dL — ABNORMAL HIGH (ref 70–99)
Glucose-Capillary: 205 mg/dL — ABNORMAL HIGH (ref 70–99)
Glucose-Capillary: 201 mg/dL — ABNORMAL HIGH (ref 70–99)
Glucose-Capillary: 148 mg/dL — ABNORMAL HIGH (ref 70–99)
Glucose-Capillary: 209 mg/dL — ABNORMAL HIGH (ref 70–99)
Glucose-Capillary: 154 mg/dL — ABNORMAL HIGH (ref 70–99)

## 2014-06-07 LAB — PHOSPHORUS: Phosphorus: 2.5 mg/dL (ref 2.3–4.6)

## 2014-06-07 LAB — VANCOMYCIN, TROUGH: VANCOMYCIN TR: 20.3 ug/mL — AB (ref 10.0–20.0)

## 2014-06-07 MED ORDER — FLUCONAZOLE 100MG IVPB
100.0000 mg | INTRAVENOUS | Status: AC
  Administered 2014-06-07 – 2014-06-11 (×5): 100 mg via INTRAVENOUS
  Filled 2014-06-07 (×5): qty 50

## 2014-06-07 MED ORDER — VANCOMYCIN HCL IN DEXTROSE 750-5 MG/150ML-% IV SOLN
750.0000 mg | Freq: Two times a day (BID) | INTRAVENOUS | Status: AC
  Administered 2014-06-08 – 2014-06-12 (×9): 750 mg via INTRAVENOUS
  Filled 2014-06-07 (×10): qty 150

## 2014-06-07 MED ORDER — FUROSEMIDE 10 MG/ML IJ SOLN
60.0000 mg | Freq: Once | INTRAMUSCULAR | Status: AC
  Administered 2014-06-07: 60 mg via INTRAVENOUS
  Filled 2014-06-07: qty 6

## 2014-06-07 NOTE — Progress Notes (Signed)
ANTIBIOTIC CONSULT NOTE - FOLLOW UP  Pharmacy Consult for Vancomycin, Primaxin Indication: pneumonia  No Known Allergies  Patient Measurements: Height: 5\' 6"  (167.6 cm) Weight: 173 lb 11.6 oz (78.8 kg) IBW/kg (Calculated) : 59.3  Vital Signs: Temp: 99.3 F (37.4 C) (04/02 1200) BP: 134/57 mmHg (04/02 1200) Pulse Rate: 104 (04/02 0345) Intake/Output from previous day: 04/01 0701 - 04/02 0700 In: 2670 [I.V.:100; NG/GT:1870; IV Piggyback:700] Out: 1905 [Urine:1905]  Labs:  Recent Labs  06/05/14 0440 06/06/14 0530 06/07/14 0430  WBC 17.8* 16.6* 13.9*  HGB 7.9* 7.4* 7.5*  PLT 481* 504* 530*  CREATININE 0.76 0.68 0.61   Estimated Creatinine Clearance: 73.3 mL/min (by C-G formula based on Cr of 0.61).  Recent Labs  06/05/14 1700 06/07/14 1447  VANCOTROUGH 12.6 20.3*    Assessment: 69 yo female smoker admitted 3/15 with H1N1 influenza & pneumococcal PNA and sepsis. Patient completed Tamiflu and antibiotic course last week; however, patient has had continued fevers this week off of antibiotics. Trach aspirate culture collected 3/28, Vancomycin/Zosyn resumed 3/19. Today, culture showing MRSA and E.coli and antibiotics are being adjusted to Vancomycin and Primaxin.  Anti-infectives: 3/15 >> Vanc >> 3/15; restart 3/22 >> 3/25, restart 3/29 >> 3/15 >> Zosyn>> 3/15, restart 3/29 >> 3/30 3/15 >> Ceftriaxone >> 3/25 3/15 >> Azithromycin >> 3/19 3/15 >> Tamiflu >> 3/20 3/31 >> Primaxin >>   Today, 06/07/2014:  Tmax: 100.9   WBCs: elevated, 13.9  Renal: Scr 0.61, CrCl~73 ml/min  VT = 20.3  4/2: Day #5 Vanc and Day #3 Primaxin for MRSA and ESBL Ecoli Pneumonia.  Vancomycin trough level today is slightly above the goal range.   Goal of Therapy:  Vancomycin trough level 15-20 mcg/ml Appropriate abx dosing, eradication of infection.   Plan:   Continue Primaxin 500mg  IV q6h  Decrease to Vancomycin 750 mg IV q12h.  Recheck Vanc trough as needed.  Follow up  renal fxn, culture results, and clinical course.   Lynann Beaverhristine Jesilyn Easom PharmD, BCPS Pager [phone] 06/07/2014 3:58 PM

## 2014-06-07 NOTE — Progress Notes (Signed)
PULMONARY / CRITICAL CARE MEDICINE   Name: Grace Mitchell MRN: [id number] DOB: [birth year]    ADMISSION DATE:  05/20/2014 CONSULTATION DATE:  06/07/2014  REFERRING MD :  EDP  CHIEF COMPLAINT:  Body aches, SOB, chills, fever  INITIAL PRESENTATION:  69 y/o female smoker with dyspnea, fever (Tm103F), chills, malaise from H1N1 influenza & pneumococcal PNA and sepsis.  STUDIES:  3/30  LE Doppler >> Negative for DVT bilaterally   SIGNIFICANT EVENTS: 3/15  admit 3/16  ETT for resp failure 3/17  paralytic for worsening pO2/FIO2 3/26  change to pressure control 3/27  Fever >> change line 3/28  Attempted PSV wean, failed with tachypnea and low lung volumes 3/29  Initially fever improved, then spike to 102 3/30  PEG on hold for fever/staph infection 4/01  Weaning on 10/5, noted culture results   SUBJECTIVE:    On ATC this am.  Did ATC most of the day 4/1, then back to MV   VITAL SIGNS: Temp:  [98.4 F (36.9 C)-100.9 F (38.3 C)] 100.2 F (37.9 C) (04/02 0800) Pulse Rate:  [97-104] 104 (04/02 0345) Resp:  [17-36] 23 (04/02 0800) BP: (110-151)/(40-64) 124/52 mmHg (04/02 0600) SpO2:  [98 %-100 %] 100 % (04/02 0815) FiO2 (%):  [30 %-40 %] 40 % (04/02 0815) Weight:  [78.8 kg (173 lb 11.6 oz)] 78.8 kg (173 lb 11.6 oz) (04/02 0500)   INTAKE / OUTPUT: Intake/Output      04/01 0701 - 04/02 0700 04/02 0701 - 04/03 0700   I.V. (mL/kg) 100 (1.3) 20 (0.3)   NG/GT 1870 95   IV Piggyback 700    Total Intake(mL/kg) 2670 (33.9) 115 (1.5)   Urine (mL/kg/hr) 1905 (1) 840 (5.5)   Stool 0 (0)    Total Output 1905 840   Net +765 -725        Stool Occurrence 1 x      PHYSICAL EXAMINATION: General: ill appearing female in no distress, currently ATC Neuro: follows commands, diffusely weak, able to raise arms off bed to command HEENT: Trach site c/d/i Cardiovascular: regular, tachycardic Lungs: even/non-labored, lungs bilaterally with coarse rhonchi Abdomen: soft, non tender Musculoskeletal:  1+ edema Skin: no rashes, R wrist with blistering (improved 4/1), appearance of possible adhesive tear (had a R aline)  LABS:  CBC  Recent Labs Lab 06/05/14 0440 06/06/14 0530 06/07/14 0430  WBC 17.8* 16.6* 13.9*  HGB 7.9* 7.4* 7.5*  HCT 27.1* 25.6* 26.0*  PLT 481* 504* 530*   BMET  Recent Labs Lab 06/05/14 0440 06/06/14 0530 06/07/14 0430  NA 146* 144 143  K 4.1 3.3* 3.8  CL 113* 111 106  CO2 BUN 30* 26* 23  CREATININE 0.76 0.68 0.61  GLUCOSE 295* 208* 223*   Electrolytes  Recent Labs Lab 06/05/14 0440 06/06/14 0530 06/07/14 0430  CALCIUM 8.3* 8.1* 7.8*  MG  --  2.2 1.9  PHOS  --  2.8 2.5    ABG  Recent Labs Lab 05/31/14 1610 06/02/14 0435  PHART 7.577* 7.503*  PCO2ART 34.9* 38.6  PO2ART 90.5 114.0*    Glucose  Recent Labs Lab 06/06/14 1216 06/06/14 1546 06/06/14 2026 06/06/14 2353 06/07/14 0441 06/07/14 0753  GLUCAP 213* 164* 205* 200* 205* 201*    Imaging Dg Chest Port 1 View  06/07/2014   CLINICAL DATA:  Acute respiratory failure. History of diabetes and hypertension. Ex-smoker.  EXAM: PORTABLE CHEST - 1 VIEW  COMPARISON:  06/03/2014 and 06/05/2014.  FINDINGS: 0547 hours. Tracheostomy,  right arm PICC and nasogastric tube appear unchanged. The heart size and mediastinal contours are stable. Diffuse bilateral airspace opacities have not significantly changed. No pneumothorax or significant pleural effusion identified. The bones appear unchanged.  IMPRESSION: No significant change in bilateral airspace opacities and support system.   Electronically Signed   By: Carey BullocksWilliam  Veazey M.D.   On: 06/07/2014 08:40   ASSESSMENT / PLAN:  PULMONARY ET 3/16>>3/24 Trach 3/24>> A: Acute respiratory failure with ARDS 2nd to H1N1 and Pneumococcal PNA . Failure to wean from vent s/p trach. Tobacco abuse. New E coli and MRSA HCAP P:   Changed to pressure control 3/26 >> decreased PC to 20 and RR to 16 on 4/1 ATC as she can tolerate Wean to  trach collar as tolerated >> volume status and weakness seem to be issues preventing weaning F/u CXR intermittently  Likely LTAC for rehab  CARDIOVASCULAR Lt IJ CVL 3/16 >> 3/27 RUE PICC 3/27 >>  A:  Sepsis 2nd to PNA >> resolved. Tachycardia - suspect fever related  Hx HTN, HLD. P:  ASA, zocor Lopressor 25 BID Diuresis as she can tolerate, repeat lasix 4/2  RENAL A:   AKI from sepsis >> resolved. Hypernatremia - improving Hypokalemia  Hypervolemia. P:   Free water to 200 ml Q6  Replace electrolytes as needed  GASTROINTESTINAL A:   Nutrition Dysphagia P:   TFs to goal Protonix for SUP IR consult for PEG placement on hold for fever + staph/E-Coli infection  HEMATOLOGIC A:   Anemia of critical illness. P:  F/u CBC DVT: lovenox  INFECTIOUS A:   H1N1/Pneumococcal pneumonia >> completed Abx 3/25. New Fever 3/27 - replaced lines 3/27, ruled out DVT 3/29,  KUB wnl.   P:   Pneumococcal Ag 3/15 >> positive Influenza PCR 3/15>> H1N1 pos Blood 3/22 >> neg Blood 3/28 >> Sputum 3/28 >> MRSA (sens vanco)+ E-Coli (sens imipenem) UA 3/28 >> neg HIV 3/29 >> nr  Vanco 3/29 >>  Zosyn 3/29 >> 3/30 Imipenem 3/31 >>  ENDOCRINE CBG (last 3)  A:   Hx of DM with Neuropathy, hypothyroidism. P:   Continue synthroid SSI Lantus to 70 units 3/31 TF coverage  NEUROLOGIC A:   Anxiety, insomnia, chronic pain. 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 P:   PT PRN fentanyl PRN ativan for anxiety     Likely would be good candidate for LTAC placement.  RN Case mgr working on placement.  Patient meets 21 days on 4/5 and will be eligible for transfer.      Levy Pupaobert Lasalle Abee, MD, PhD 06/07/2014, 9:03 AM [facility] [phone] or if no answer [phone]

## 2014-06-08 LAB — CBC WITH DIFFERENTIAL/PLATELET
BASOS ABS: 0 10*3/uL (ref 0.0–0.1)
Basophils Relative: 0 % (ref 0–1)
EOS ABS: 0.3 10*3/uL (ref 0.0–0.7)
Eosinophils Relative: 2 % (ref 0–5)
HCT: 29.1 % — ABNORMAL LOW (ref 36.0–46.0)
Hemoglobin: 8.5 g/dL — ABNORMAL LOW (ref 12.0–15.0)
Lymphocytes Relative: 14 % (ref 12–46)
Lymphs Abs: 1.9 10*3/uL (ref 0.7–4.0)
MCH: 26.6 pg (ref 26.0–34.0)
MCHC: 29.2 g/dL — ABNORMAL LOW (ref 30.0–36.0)
MCV: 90.9 fL (ref 78.0–100.0)
MONO ABS: 0.9 10*3/uL (ref 0.1–1.0)
Monocytes Relative: 7 % (ref 3–12)
Neutro Abs: 10.8 10*3/uL — ABNORMAL HIGH (ref 1.7–7.7)
Neutrophils Relative %: 77 % (ref 43–77)
PLATELETS: 653 10*3/uL — AB (ref 150–400)
RBC: 3.2 MIL/uL — ABNORMAL LOW (ref 3.87–5.11)
RDW: 17.5 % — ABNORMAL HIGH (ref 11.5–15.5)
WBC: 13.9 10*3/uL — ABNORMAL HIGH (ref 4.0–10.5)

## 2014-06-08 LAB — GLUCOSE, CAPILLARY
GLUCOSE-CAPILLARY: 146 mg/dL — AB (ref 70–99)
GLUCOSE-CAPILLARY: 162 mg/dL — AB (ref 70–99)
Glucose-Capillary: 155 mg/dL — ABNORMAL HIGH (ref 70–99)
Glucose-Capillary: 183 mg/dL — ABNORMAL HIGH (ref 70–99)
Glucose-Capillary: 173 mg/dL — ABNORMAL HIGH (ref 70–99)
Glucose-Capillary: 176 mg/dL — ABNORMAL HIGH (ref 70–99)

## 2014-06-08 LAB — CULTURE, BLOOD (ROUTINE X 2)
CULTURE: NO GROWTH
Culture: NO GROWTH

## 2014-06-08 LAB — PHOSPHORUS: PHOSPHORUS: 3.3 mg/dL (ref 2.3–4.6)

## 2014-06-08 LAB — MAGNESIUM: MAGNESIUM: 1.9 mg/dL (ref 1.5–2.5)

## 2014-06-08 LAB — BASIC METABOLIC PANEL
Anion gap: 9 (ref 5–15)
BUN: 22 mg/dL (ref 6–23)
CALCIUM: 8.6 mg/dL (ref 8.4–10.5)
CHLORIDE: 103 mmol/L (ref 96–112)
CO2: 28 mmol/L (ref 19–32)
Creatinine, Ser: 0.5 mg/dL (ref 0.50–1.10)
GLUCOSE: 141 mg/dL — AB (ref 70–99)
Potassium: 3.7 mmol/L (ref 3.5–5.1)
SODIUM: 140 mmol/L (ref 135–145)

## 2014-06-08 MED ORDER — FUROSEMIDE 20 MG PO TABS
20.0000 mg | ORAL_TABLET | Freq: Two times a day (BID) | ORAL | Status: DC
  Administered 2014-06-08 – 2014-06-11 (×5): 20 mg
  Filled 2014-06-08 (×5): qty 1

## 2014-06-08 MED ORDER — POTASSIUM CHLORIDE 20 MEQ/15ML (10%) PO SOLN
20.0000 meq | ORAL | Status: AC
  Administered 2014-06-08 (×2): 20 meq
  Filled 2014-06-08 (×2): qty 15

## 2014-06-08 NOTE — Progress Notes (Signed)
[facility] FOR AM LAB REPLACEMENT ONLY  The patient does apply [facility] the [facility] Electroly[facility] Protocol based on the criteria listed below:   1. Is GFR >/= 40 ml/min? Yes.    Patient's GFR today is >90 2. Is urine output >/= 0.5 ml/kg/hr for the last 6 hours? Yes.   Patient's UOP is 0.75 ml/kg/hr 3. Is BUN < 60 mg/dL? Yes.    Patient's BUN today is 22 4. Abnormal electrolyte(s): K 3.7 5. Ordered repletion with: [facility] adult ICU replacement protocol 6. If a panic level lab has been reported, has the CCM MD in charge been notified? Yes.  .   Physician:  Dr. Coralyn HellingVineet Sood  [facility] ChesterRAMZAH, Alda BertholdYOUNKAI E 06/08/2014 6:09 AM

## 2014-06-08 NOTE — Progress Notes (Signed)
PULMONARY / CRITICAL CARE MEDICINE   Name: Grace Mitchell MRN: [id number] DOB: 1945-12-26    ADMISSION DATE:  05/20/2014 CONSULTATION DATE:  06/08/2014  REFERRING MD :  EDP  CHIEF COMPLAINT:  Body aches, SOB, chills, fever  INITIAL PRESENTATION:  69 y/o female smoker with dyspnea, fever (Tm103F), chills, malaise from H1N1 influenza & pneumococcal PNA and sepsis.  STUDIES:  3/30  LE Doppler >> Negative for DVT bilaterally   SIGNIFICANT EVENTS: 3/15  admit 3/16  ETT for resp failure 3/17  paralytic for worsening pO2/FIO2 3/26  change to pressure control 3/27  Fever >> change line 3/28  Attempted PSV wean, failed with tachypnea and low lung volumes 3/29  Initially fever improved, then spike to 102 3/30  PEG on hold for fever/staph infection 4/01  Weaning on 10/5, noted culture results   SUBJECTIVE:    Has done ATC 24x7 Spoke to me when I occluded her trach, asked when she could eat  VITAL SIGNS: Temp:  [98.8 F (37.1 C)-99.7 F (37.6 C)] 99.3 F (37.4 C) (04/03 1000) Pulse Rate:  [100-106] 105 (04/03 0942) Resp:  [9-43] 32 (04/03 1000) BP: (123-167)/(53-78) 144/64 mmHg (04/03 1000) SpO2:  [94 %-100 %] 100 % (04/03 1000) FiO2 (%):  [28 %-40 %] 28 % (04/03 1100) Weight:  [77 kg (169 lb 12.1 oz)] 77 kg (169 lb 12.1 oz) (04/03 0455)   INTAKE / OUTPUT: Intake/Output      04/02 0701 - 04/03 0700 04/03 0701 - 04/04 0700   I.V. (mL/kg) 130 (1.7)    NG/GT 2100 260   IV Piggyback 850    Total Intake(mL/kg) 3080 (40) 260 (3.4)   Urine (mL/kg/hr) 4535 (2.5) 400 (1.1)   Stool 1 (0)    Total Output 4536 400   Net -1456 -140        Stool Occurrence 1 x      PHYSICAL EXAMINATION: General: ill appearing female in no distress, currently ATC Neuro: follows commands, able to raise arms off bed to command, able to speak HEENT: Trach site c/d/i Cardiovascular: regular, tachycardic Lungs: even/non-labored, lungs bilaterally with coarse rhonchi Abdomen: soft, non  tender Musculoskeletal: 1+ edema Skin: no rashes, R wrist with blistering (improved 4/1), appearance of possible adhesive tear (had a R aline)  LABS:  CBC  Recent Labs Lab 06/06/14 0530 06/07/14 0430 06/08/14 0415  WBC 16.6* 13.9* 13.9*  HGB 7.4* 7.5* 8.5*  HCT 25.6* 26.0* 29.1*  PLT 504* 530* 653*   BMET  Recent Labs Lab 06/06/14 0530 06/07/14 0430 06/08/14 0415  NA 144 143 140  K 3.3* 3.8 3.7  CL 111 106 103  CO2 BUN 26* 23 22  CREATININE 0.68 0.61 0.50  GLUCOSE 208* 223* 141*   Electrolytes  Recent Labs Lab 06/06/14 0530 06/07/14 0430 06/08/14 0415  CALCIUM 8.1* 7.8* 8.6  MG 2.2 1.9 1.9  PHOS 2.8 2.5 3.3    ABG  Recent Labs Lab 06/02/14 0435  PHART 7.503*  PCO2ART 38.6  PO2ART 114.0*    Glucose  Recent Labs Lab 06/07/14 1214 06/07/14 1547 06/07/14 2003 06/08/14 0023 06/08/14 0428 06/08/14 0758  GLUCAP 209* 148* 154* 162* 146* 155*    Imaging Dg Chest Port 1 View  06/07/2014   CLINICAL DATA:  Acute respiratory failure. History of diabetes and hypertension. Ex-smoker.  EXAM: PORTABLE CHEST - 1 VIEW  COMPARISON:  06/03/2014 and 06/05/2014.  FINDINGS: 0547 hours. Tracheostomy, right arm PICC and nasogastric tube appear unchanged. The  heart size and mediastinal contours are stable. Diffuse bilateral airspace opacities have not significantly changed. No pneumothorax or significant pleural effusion identified. The bones appear unchanged.  IMPRESSION: No significant change in bilateral airspace opacities and support system.   Electronically Signed   By: Carey BullocksWilliam  Veazey M.D.   On: 06/07/2014 08:40   ASSESSMENT / PLAN:  PULMONARY ET 3/16>>3/24 Trach 3/24>> A: Acute respiratory failure with ARDS 2nd to H1N1 and Pneumococcal PNA . Tracheostomy Tobacco abuse. New E coli and MRSA HCAP P:   Goal ATC indefinitely F/u CXR intermittently  Likely LTAC for rehab PMV trials, swallowing eval ordered   CARDIOVASCULAR Lt IJ CVL 3/16 >>  3/27 RUE PICC 3/27 >>  A:  Sepsis 2nd to PNA >> resolved. Tachycardia - suspect fever related  Hx HTN, HLD. P:  ASA, zocor Lopressor 25 BID Diuresis as she can tolerate, start maintenance dose lasix 4/3 and increase as able  RENAL A:   AKI from sepsis >> resolved. Hypernatremia - improving Hypokalemia  Hypervolemia. P:   Free water to 200 ml Q6  Replace electrolytes as needed Lasix as above  GASTROINTESTINAL A:   Nutrition Dysphagia P:   TFs to goal Protonix for SUP IR consult for PEG placement on hold for fever + staph/E-Coli infection Hope she will be able to pass a swallow eval and avoid PEG altogether  HEMATOLOGIC A:   Anemia of critical illness. P:  F/u CBC DVT: lovenox  INFECTIOUS A:   H1N1/Pneumococcal pneumonia >> completed Abx 3/25. New Fever 3/27 - replaced lines 3/27, ruled out DVT 3/29,  KUB wnl.   P:   Pneumococcal Ag 3/15 >> positive Influenza PCR 3/15>> H1N1 pos Blood 3/22 >> neg Blood 3/28 >> Sputum 3/28 >> MRSA (sens vanco)+ E-Coli (sens imipenem) UA 3/28 >> neg HIV 3/29 >> nr  Vanco 3/29 >>  Zosyn 3/29 >> 3/30 Imipenem 3/31 >>  ENDOCRINE CBG (last 3)  A:   Hx of DM with Neuropathy, hypothyroidism. P:   Continue synthroid SSI Lantus to 70 units 3/31 TF coverage  NEUROLOGIC A:   Anxiety, insomnia, chronic pain. 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 P:   PT PRN fentanyl PRN ativan for anxiety     Likely would be good candidate for LTAC placement.  RN Case mgr working on placement.  Patient meets 21 days on 4/5 and will be eligible for transfer.      Levy Pupaobert Adante Courington, MD, PhD 06/08/2014, 11:45 AM [facility] [phone] or if no answer [phone]

## 2014-06-09 LAB — GLUCOSE, CAPILLARY
GLUCOSE-CAPILLARY: 205 mg/dL — AB (ref 70–99)
GLUCOSE-CAPILLARY: 219 mg/dL — AB (ref 70–99)
Glucose-Capillary: 195 mg/dL — ABNORMAL HIGH (ref 70–99)
Glucose-Capillary: 261 mg/dL — ABNORMAL HIGH (ref 70–99)
Glucose-Capillary: 195 mg/dL — ABNORMAL HIGH (ref 70–99)
Glucose-Capillary: 176 mg/dL — ABNORMAL HIGH (ref 70–99)

## 2014-06-09 LAB — BASIC METABOLIC PANEL
ANION GAP: 7 (ref 5–15)
BUN: 19 mg/dL (ref 6–23)
CO2: 27 mmol/L (ref 19–32)
Calcium: 8.6 mg/dL (ref 8.4–10.5)
Chloride: 102 mmol/L (ref 96–112)
Creatinine, Ser: 0.48 mg/dL — ABNORMAL LOW (ref 0.50–1.10)
GFR calc Af Amer: >90 mL/min (ref >90)
GLUCOSE: 214 mg/dL — AB (ref 70–99)
Potassium: 4.2 mmol/L (ref 3.5–5.1)
SODIUM: 136 mmol/L (ref 135–145)

## 2014-06-09 MED ORDER — OXYCODONE-ACETAMINOPHEN 5-325 MG PO TABS
1.0000 | ORAL_TABLET | Freq: Four times a day (QID) | ORAL | Status: DC | PRN
  Administered 2014-06-09 – 2014-06-13 (×6): 1 via ORAL
  Filled 2014-06-09 (×6): qty 1

## 2014-06-09 MED ORDER — FENTANYL CITRATE 0.05 MG/ML IJ SOLN
25.0000 ug | INTRAMUSCULAR | Status: DC | PRN
  Administered 2014-06-09 – 2014-06-12 (×14): 50 ug via INTRAVENOUS
  Filled 2014-06-09 (×14): qty 2

## 2014-06-09 NOTE — Evaluation (Signed)
[facility] Speaking Valve - Evaluation Patient Details  Name: Grace Mitchell MRN: [id number] Date of Birth: August 29, 1945  Today's Date: 06/09/2014 Time: [id number] SLP Time Calculation (min) (ACUTE ONLY): 30 min  Past Medical History:  Past Medical History  Diagnosis Date  . Diabetes mellitus   . Hypothyroidism   . Hypertension   . Hyperlipidemia    Past Surgical History:  Past Surgical History  Procedure Laterality Date  . Back surgery     HPI:  69 [facility] - Pt required vent use from 3/30-4/3 - trach placed - and pt has NG in place.  PMSV and swallow eval ordered.     Assessment / Plan / Recommendation Clinical Impression  Overall good tolerance of PMSV with all vitals remaining stable throughout trial of approximately 29 minutes.  Unfortunately, pt is nearly aphonic - despite total cues to phonate upon exhalation.  Spouse Deniece PortelaWayne and daughter Selena BattenKim present and both educated to indications/contraindictions of PMSV use using teach back and providing written signs.  Deniece PortelaWayne and Kim returned demonstration of donnng/removing PMSV and report comfort with its use.  Advised to assure pt cuff is deflated and pt is clear of secretions prior to PMSV placement- advised to call RN if concerned. Recommend full supervision with pmsv approximately 30 minutes at a time.  Pt did become fatigued toward end of trial.  SLP to follow for maximal tolerance, benefit of PMSV.          SLP Assessment  Patient needs continued Speech Lanaguage Pathology Services    Follow Up Recommendations   (TBD)    Frequency and Duration min 2x/week  2 weeks   Pertinent Vitals/Pain Low grade temp, congested    SLP Goals Potential to Achieve Goals (ACUTE ONLY): Good Potential Considerations (ACUTE ONLY):  (daughter reports mother's voice was mildly hoarse prior to admit)   PMSV Trial  PMSV was placed for: phonation due to trach in place Able to redirect subglottic air through upper airway:  Yes Able to Attain Phonation: Yes Voice Quality: Aphonic;Low vocal intensity;Breathy Able to Expectorate Secretions: No Level of Secretion Expectoration with PMSV:  (tracheal expectoration prior to placement of valve, RN suctioned viscous secretions from trach site) Breath Support for Phonation: Moderately decreased Intelligibility: Intelligible Respirations During Trial: 27 SpO2 During Trial: 98 % Pulse During Trial: 94 Behavior: Alert;Cooperative;Expresses self well;Responsive to questions;Good eye contact   Tracheostomy Tube    #6 Shiley Cuffed   Vent Dependency  FiO2 (%): 28 %    Cuff Deflation Trial Tolerated Cuff Deflation: Yes Behavior: Alert;Expresses self well;Smiling;Responsive to questions   Donavan Burnetamara Johnathon Mittal, MS [facility] SLP [phone]

## 2014-06-09 NOTE — Progress Notes (Addendum)
Physical Therapy Treatment Patient Details Name: Grace Mitchell MRN: [id number] DOB: 26-Aug-1945 Today's Date: 06/09/2014    History of Present Illness 69 yo female admitted with respiratory failure, ARDS, Pneumococcal Pna, H1N1. Intubated 3/16. Trach 3/23. PICC 3/27.  Hx of DM, HTN.    PT Comments    Pt more alert today, able to move BUEs better, however still has significant weakness in all 4 extremeties.  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 She required +2 assist for supine to sit, she sat on edge of bed x 10 minutes with mod to max assist for sitting balance.   Follow Up Recommendations  LTACH;Supervision/Assistance - 24 hour     Equipment Recommendations       Recommendations for Other Services OT consult     Precautions / Restrictions Precautions Precautions: Fall Restrictions Weight Bearing Restrictions: No    Mobility  Bed Mobility Overal bed mobility: +2 for physical assistance;Needs Assistance Bed Mobility: Supine to Sit;Sit to Supine     Supine to sit: Total assist;+2 for physical assistance Sit to supine: Total assist;+2 for physical assistance   General bed mobility comments: pt 20%, pt able to assist with advancing BLEs, total assist to raise trunk  Transfers                    Ambulation/Gait                 Stairs            Wheelchair Mobility    Modified Rankin (Stroke Patients Only)       Balance Overall balance assessment: Needs assistance Sitting-balance support: Bilateral upper extremity supported;Feet supported Sitting balance-Leahy Scale: Zero Sitting balance - Comments: pt sat on EOB x 10 minutes with mod to max assist for balance, leans left and posteriorly, verbal and manual cues to come to neutral but pt unable to maintain neutral with BUE support Postural control: Posterior lean;Left lateral lean                     High Level Balance Comments: attempted reaching  activities in sitting, pt unable to reach over BOS    Cognition Arousal/Alertness: Awake/alert Behavior During Therapy: [facility] for tasks assessed/performed Overall Cognitive Status: Difficult to assess                      Exercises General Exercises - Lower Extremity Long Arc Quad: AROM;Both;10 reps;Seated   Cervical AROM- flexion/extension, B rotation x 5     General Comments        Pertinent Vitals/Pain Pain Score: 9  Pain Location: B thighs Pain Intervention(s): Patient requesting pain meds-RN notified    Home Living                      Prior Function            PT Goals (current goals can now be found in the care plan section) Acute Rehab PT Goals Patient Stated Goal: none stated Time For Goal Achievement: 06/16/14 Potential to Achieve Goals: Fair Progress towards PT goals: Progressing toward goals;Goals met and updated - see care plan    Frequency  Min 3X/week    PT Plan Current plan remains appropriate    Co-evaluation             End of Session   Activity Tolerance: Patient limited by fatigue Patient left: in bed;with call  bell/phone within reach     Time: 0910-0934 PT Time Calculation (min) (ACUTE ONLY): 24 min  Charges:  $Therapeutic Activity: 23-37 mins                    G Codes:      Blondell Reveal Kistler 06/09/2014, 11:13 AM [phone]

## 2014-06-09 NOTE — Progress Notes (Signed)
PULMONARY / CRITICAL CARE MEDICINE   Name: Grace Mitchell MRN: [id number] DOB: 24-Nov-1945    ADMISSION DATE:  05/20/2014 CONSULTATION DATE:  06/09/2014  REFERRING MD :  EDP  CHIEF COMPLAINT:  Body aches, SOB, chills, fever  INITIAL PRESENTATION:  69 y/o female smoker with dyspnea, fever (Tm103F), chills, malaise from H1N1 influenza & pneumococcal PNA and sepsis.  STUDIES:  3/30  LE Doppler >> Negative for DVT bilaterally   SIGNIFICANT EVENTS: 3/15  admit 3/16  ETT for resp failure 3/17  paralytic for worsening pO2/FIO2 3/26  change to pressure control 3/27  Fever >> change line 3/28  Attempted PSV wean, failed with tachypnea and low lung volumes 3/29  Initially fever improved, then spike to 102 3/30  PEG on hold for fever/staph infection 4/01  Weaning on 10/5, noted culture results  4/03  ATC x 24 hours   SUBJECTIVE:   ATC 28% x 48 hours, asking for food.  Sitting up with PT at bedside, very weak   VITAL SIGNS: Temp:  [99 F (37.2 C)-100 F (37.8 C)] 99.5 F (37.5 C) (04/04 0400) Pulse Rate:  [104-108] 104 (04/04 0943) Resp:  [18-35] 25 (04/04 0943) BP: (104-168)/(40-93) 128/60 mmHg (04/04 0943) SpO2:  [96 %-100 %] 98 % (04/04 0943) FiO2 (%):  [28 %] 28 % (04/04 0943) Weight:  [173 lb 11.6 oz (78.8 kg)] 173 lb 11.6 oz (78.8 kg) (04/04 0414)   INTAKE / OUTPUT: Intake/Output      04/03 0701 - 04/04 0700 04/04 0701 - 04/05 0700   I.V. (mL/kg)     Other 220    NG/GT 1430    IV Piggyback 650    Total Intake(mL/kg) 2300 (29.2)    Urine (mL/kg/hr) 1605 (0.8)    Stool 0 (0)    Total Output 1605     Net +695          Urine Occurrence 1 x    Stool Occurrence 3 x      PHYSICAL EXAMINATION: General: ill appearing female in no distress, currently ATC Neuro: follows commands, appears stronger, MAE / generalized weakness  HEENT: Trach site c/d/i, clear secretions Cardiovascular: regular, tachycardic Lungs: even/non-labored, lungs bilaterally with coarse  rhonchi Abdomen: soft, non tender Musculoskeletal: 1+ edema Skin: no rashes, R wrist with blistering (improved 4/1), appearance of possible adhesive tear (had a R aline)  LABS:  CBC  Recent Labs Lab 06/06/14 0530 06/07/14 0430 06/08/14 0415  WBC 16.6* 13.9* 13.9*  HGB 7.4* 7.5* 8.5*  HCT 25.6* 26.0* 29.1*  PLT 504* 530* 653*   BMET  Recent Labs Lab 06/07/14 0430 06/08/14 0415 06/09/14 0400  NA 143 140 136  K 3.8 3.7 4.2  CL 106 103 102  CO2 BUN CREATININE 0.61 0.50 0.48*  GLUCOSE 223* 141* 214*   Electrolytes  Recent Labs Lab 06/06/14 0530 06/07/14 0430 06/08/14 0415 06/09/14 0400  CALCIUM 8.1* 7.8* 8.6 8.6  MG 2.2 1.9 1.9  --   PHOS 2.8 2.5 3.3  --      Glucose  Recent Labs Lab 06/08/14 1225 06/08/14 1544 06/08/14 2050 06/09/14 0027 06/09/14 0357 06/09/14 0758  GLUCAP 183* 173* 176* 219* 205* 195*    Imaging No results found.     ASSESSMENT / PLAN:  PULMONARY ET 3/16>>3/24 Trach 3/24>> A: Acute respiratory failure with ARDS 2nd to H1N1 and Pneumococcal PNA . Tracheostomy status Tobacco abuse. New E coli and MRSA HCAP P:  Goal ATC indefinitely F/u CXR intermittently  Likely LTAC vs SNF for rehab PMV trials, swallowing eval ordered  CARDIOVASCULAR Lt IJ CVL 3/16 >> 3/27 RUE PICC 3/27 >>  A:  Sepsis 2nd to PNA >> resolved. Tachycardia - suspect fever related  Hx HTN, HLD. P:  ASA, zocor Lopressor 25 BID Diuresis as she can tolerate, start maintenance dose lasix 4/3 and increase as able  RENAL A:   AKI from sepsis >> resolved. Hypernatremia - improving Hypokalemia  Hypervolemia P:   Free water to 200 ml Q6  Replace electrolytes as needed Lasix as above  GASTROINTESTINAL A:   Nutrition Dysphagia P:   TFs to goal Protonix for SUP D/C IR consult for now for PEG placement  Hope she will be able to pass a swallow eval and avoid PEG altogether.  SLP eval 4/3.    HEMATOLOGIC A:   Anemia of  critical illness. P:  F/u CBC DVT: lovenox  INFECTIOUS A:   H1N1/Pneumococcal pneumonia >> completed Abx 3/25. New Fever 3/27 - replaced lines 3/27, ruled out DVT 3/29,  KUB wnl.   P:   Pneumococcal Ag 3/15 >> positive Influenza PCR 3/15>> H1N1 pos Blood 3/22 >> neg Blood 3/28 >> neg Sputum 3/28 >> MRSA (sens vanco)+ E-Coli (sens imipenem) UA 3/28 >> neg HIV 3/29 >> nr  Vanco 3/29 >>  Zosyn 3/29 >> 3/30 Imipenem 3/31 >>  Diflucan 4/2 >>   D7/x abx  D3/x antifungal  ENDOCRINE CBG (last 3)  A:   Hx of DM with Neuropathy, hypothyroidism. P:   Continue synthroid SSI Lantus to 70 units 3/31 TF coverage  NEUROLOGIC A:   Anxiety, insomnia, chronic pain. 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 P:   PT D/C fentanyl PRN percocet for pain PRN ativan for anxiety     Likely would be good candidate for LTAC placement but may be too well.  SNF rehab vs LTAC.  RN Case mgr working on placement.  Patient meets 21 days on 4/5 and will be eligible for transfer to LTAC.  Still has long way for recovery.     Canary BrimBrandi Ollis, NP-C [facility] Pgr: [phone] or [phone] 06/09/2014, 9:57 AM  Attending Note:  I have examined patient, reviewed labs, studies and notes. I have discussed the case with B Ollis, and I agree with the data and plans as amended above.  Levy Pupaobert Donnice Nielsen, MD, PhD 06/09/2014, 11:45 AM [facility] [phone] or if no answer [phone]

## 2014-06-09 NOTE — Progress Notes (Signed)
CARE MANAGEMENT NOTE 06/09/2014  Patient:  Grace Mitchell,Grace Mitchell   Account Number:  [id number]  Date Initiated:  05/21/2014  Documentation initiated by:  DAVIS,RHONDA  Subjective/Objective Assessment:   sepsis and influenza     Action/Plan:   home when stable   Anticipated DC Date:  06/12/2014   Anticipated DC Plan:  LONG TERM ACUTE CARE (LTAC)  In-house referral  NA      DC Planning Services  NA      [facility] Choice  NA   Choice offered to / List presented to:             Status of service:  In process, will continue to follow Medicare Important Message given?   (If response is "NO", the following Medicare IM given date fields will be blank) Date Medicare IM given:   Medicare IM given by:   Date Additional Medicare IM given:   Additional Medicare IM given by:    Discharge Disposition:    Per UR Regulation:  Reviewed for med. necessity/level of care/duration of stay  If discussed at Long Length of Stay Meetings, dates discussed:   05/27/2014  05/29/2014  06/03/2014  06/05/2014    Comments:  [id number]/Rhonda Earlene Plateravis, RN, BSN, CCN: [phone] Case management. Chart reviewed for discharge planning and present needs. Discharge needs: none present at time of review. LTACH hopeful for [id number]/ttrach collar on 28%/termp still 100./last wbc [id number]  June 05, 2014/Rhonda L. Earlene Plateravis, RN, BSN, CCM. Case Management [facility] [phone] No discharge needs present of time of review. No changes in condition.  Temp up to 102.1  June 02, 2014/Rhonda L. Earlene Plateravis, RN, BSN, CCM. Case Management [facility] [phone] No discharge needs present of time of review. remains vent dep. on trache collar at 30%,ards by cxr, septic.  May 29, 2014/Rhonda L. Earlene Plateravis, RN, BSN, CCM. Case Management [facility] [phone] No discharge needs present of time of review.   May 28, 2014/Rhonda L. Earlene Plateravis, RN, BSN, CCM. Case Management [facility] [phone] Patient trached this am, request for [facility] to review for [possible transfer for continued care.  May 26, 2014/Rhonda L. Earlene Plateravis, RN, BSN, CCM. Case Management [facility] [phone] No discharge needs present of time of review. remains on the vent, 50%fio2, hypotensive, agitated on propofol iv drip.  May 21, 2014/Rhonda L. Earlene Plateravis, RN, BSN, CCM. Case Management [facility] [phone] No discharge needs present of time of review.

## 2014-06-09 NOTE — Evaluation (Signed)
Clinical/Bedside Swallow Evaluation Patient Details  Name: Grace Mitchell MRN: [id number] Date of Birth: [birth year]  Today's Date: 06/09/2014 Time: SLP Start Time (ACUTE ONLY): 1116 SLP Stop Time (ACUTE ONLY): 1144 SLP Time Calculation (min) (ACUTE ONLY): 28 min  Past Medical History:  Past Medical History  Diagnosis Date  . Diabetes mellitus   . Hypothyroidism   . Hypertension   . Hyperlipidemia    Past Surgical History:  Past Surgical History  Procedure Laterality Date  . Back surgery     HPI:  69 [facility] - Pt required vent use from 3/30-4/3 - trach placed - and pt has NG in place.  PMSV and swallow eval ordered.     Assessment / Plan / Recommendation Clinical Impression  Limited swallow evaluation completed due to pt deconditioning, lack of adequate phonation and NG in place.  Pt with apparent lingual deviation to left upon protrusion - uncertain if baseline but pt does have h/o cva.  Spouse and daughter present are unable to verify if lingual deviation is baseline.  SlP provided pt with single ice chip boluses with pmsv in place - although no overt s/s of aspiration apparent, pt does have copious secretions and is nearly aphonic.  Pt became sleepy during eval, RN reports pt received pain medicine.  Recommend defer MBS until secretion management and phonation has improved.  Advised pt to two goals and she/family is in agreement.    Ice chips with pmsv in place recommended.  Will follow for MBS readiness.  .    Aspiration Risk  Moderate    Diet Recommendation Ice chips PRN after oral care (with pmsv in place)   Liquid Administration via: Spoon Medication Administration: Via alternative means Postural Changes and/or Swallow Maneuvers: Seated upright 90 degrees    Other  Recommendations Oral Care Recommendations: Oral care prior to ice chips   Follow Up Recommendations   (TBD)    Frequency and Duration min 2x/week  2 weeks   Pertinent  Vitals/Pain Low grade fever, decreased - viscous secretions      Swallow Study Prior Functional Status   see hhx    General Date of Onset: 06/09/14 HPI: 69 [facility] - Pt required vent use from 3/30-4/3 - trach placed - and pt has NG in place.  PMSV and swallow eval ordered.   Type of Study: Bedside swallow evaluation Diet Prior to this Study: NPO;Other (Comment) (NG tube) Temperature Spikes Noted: Yes Respiratory Status: Trach collar Trach Size and Type: Cuff;#6 Behavior/Cognition: Alert;Cooperative;Pleasant mood Oral Cavity - Dentition: Adequate natural dentition (upper partial, lower dentures) Self-Feeding Abilities: Needs assist Patient Positioning: Upright in bed Baseline Vocal Quality: Low vocal intensity;Hoarse Volitional Cough: Strong Volitional Swallow: Able to elicit    Oral/Motor/Sensory Function Overall Oral Motor/Sensory Function:  (lingual deviation right upon protrusion)   Ice Chips Ice chips: Within functional limits Presentation: Spoon x3   Did not test other consistencies due to NG in place and nearly aphonia raising concern for airway protection adequacy   Thin Liquid Thin Liquid: Not tested    Nectar Thick Nectar Thick Liquid: Not tested   Honey Thick Honey Thick Liquid: Not tested   Puree Puree: Not tested   Solid   GO    Solid: Not tested       Mills KollerKimball, Akshay Spang Ann Izabella Marcantel, MS [facility] SLP [phone]

## 2014-06-09 NOTE — Progress Notes (Signed)
Inpatient Diabetes Program Recommendations  AACE/ADA: New Consensus Statement on Inpatient Glycemic Control (2013)  Target Ranges:  Prepandial:   less than 140 mg/dL      Peak postprandial:   less than 180 mg/dL (1-2 hours)      Critically ill patients:  140 - 180 mg/dL     Results for Grace Mitchell, Ruchama K (MRN [id number]) as of 06/09/2014 08:30  Ref. Range 06/09/2014 00:27 06/09/2014 03:57  Glucose-Capillary Latest Range: 70-99 mg/dL 409219 (H) 811205 (H)     Current DM Orders: Lantus 70 units daily            Novolog 3 units Q4 hours (tube feed coverage)            Novolog Resistant SSI Q4 hours     **Glucose levels >200 mg/dl since midnight.  **Tube feeds are running at goal at 65cc/hour.    MD- Please consider increasing Novolog Tube Feed coverage to 4 units Q4 hours     Will follow Ambrose FinlandJeannine Johnston Hazley Dezeeuw RN, MSN, CDE Diabetes Coordinator Inpatient Diabetes Program Team Pager: [phone] (8a-5p)

## 2014-06-09 NOTE — Progress Notes (Signed)
NUTRITION FOLLOW-UP  DOCUMENTATION CODES Per approved criteria  -Not Applicable   INTERVENTION: - Diet advancement per SLP recommendations  - Continue Vital AF 1.2 @ 65 ml/hr via OGT.  Tube feeding regimen provides 1872 kcal (100% of needs), 117 grams of protein, and 1264 ml of H2O.   RD will continue to monitor  NUTRITION DIAGNOSIS: Inadequate oral intake related to inability to eat as evidenced by NPO; ongoing  Goal: Pt to meet >/= 90% of their estimated nutrition needs; met with TF.   Monitor:  Weight trend, initiation and toleration of TF, labs/CBGs, diet advancement/SLP recommendations  69 y.o. female  Admitting Dx: H1N1, pneumonia  ASSESSMENT: 69 y.o. F brought to [facility] ED 3/15 with 4 day hx of generalized body aches, SOB, subjective fevers, chills. In ED, had fever to 103, was placed on NRB for hypoxia and had RR in high 30's. PCCM called for admission due to concern for potential of deterioration.   3/16: - Pt intubated 3/16 - Spoke with husband who reports that pt as eating well until she started to get sick about 2 weeks ago. No significant weight loss. Pt's usual body weight is 165 lbs.  - No signs of fat or muscle wasting.   3/18: - Pt with H1N1 and Pneumonia. Running low grade fever.  - CBGs elevated- Lantus increased per MD note. Followed by Diabetes Coordinator.  - Pt fluid overloaded (+8.7 L since admission.) Wt increased 12 lb from 3/17 to 3/18. Re-weighed during RD visit to confirm. On Lasix. Per RN, urine output increasing.   3/23: Lurline Idol placed, failed to wean  4/4: - Per RN and pt, pt tolerating TF. - No longer on vent - SLP following for PMSV tolerance and diet advancement. Recommended pt remain NPO 4/4.  - Labs and medications reviewed   CBG 176-219  Height: Ht Readings from Last 1 Encounters:  05/29/14 '5\' 6"'  (1.676 m)   Weight: Wt Readings from Last 1 Encounters:  06/09/14 173 lb 11.6 oz (78.8 kg)   BMI:  Body mass index is 28.05  kg/(m^2).  Estimated Nutritional Needs: Kcal: 1800-2000 Protein: 100-115 g Fluid: 1.8 L/day  Skin: two small skin tears on sacrum  Diet Order: Diet NPO time specified  EDUCATION NEEDS: -Education not appropriate at this time   Intake/Output Summary (Last 24 hours) at 06/09/14 1308 Last data filed at 06/09/14 1000  Gross per 24 hour  Intake   1850 ml  Output   1605 ml  Net    245 ml    Last BM: 4/3  Labs:   Recent Labs Lab 06/06/14 0530 06/07/14 0430 06/08/14 0415 06/09/14 0400  NA 144 143 140 136  K 3.3* 3.8 3.7 4.2  CL 111 106 103 102  CO2 '26 25 28 27  ' BUN 26* '23 22 19  ' CREATININE 0.68 0.61 0.50 0.48*  CALCIUM 8.1* 7.8* 8.6 8.6  MG 2.2 1.9 1.9  --   PHOS 2.8 2.5 3.3  --   GLUCOSE 208* 223* 141* 214*    CBG (last 3)   Recent Labs  06/09/14 0027 06/09/14 0357 06/09/14 0758  GLUCAP 219* 205* 195*    Scheduled Meds: . amitriptyline  10 mg Oral QHS  . antiseptic oral rinse  7 mL Mouth Rinse QID  . aspirin  81 mg Oral Daily  . bacitracin  1 application Topical BID  . chlorhexidine  15 mL Mouth Rinse BID  . enoxaparin (LOVENOX) injection  40 mg Subcutaneous Q24H  . fluconazole (  DIFLUCAN) IV  100 mg Intravenous Q24H  . free water  200 mL Per Tube Q6H  . furosemide  20 mg Per Tube BID  . imipenem-cilastatin  500 mg Intravenous 4 times per day  . insulin aspart  0-20 Units Subcutaneous 6 times per day  . insulin aspart  3 Units Subcutaneous 6 times per day  . insulin glargine  70 Units Subcutaneous Daily  . levothyroxine  150 mcg Per Tube QAC breakfast  . metoprolol tartrate  25 mg Oral BID  . mupirocin ointment  1 application Nasal BID  . pantoprazole sodium  40 mg Per Tube Q24H  . simvastatin  20 mg Per Tube QPM  . sodium chloride  10-40 mL Intracatheter Q12H  . vancomycin  750 mg Intravenous Q12H    Continuous Infusions: . feeding supplement (VITAL AF 1.2 CAL) 1,000 mL (06/08/14 2350)    Past Medical History  Diagnosis Date  . Diabetes  mellitus   . Hypothyroidism   . Hypertension   . Hyperlipidemia     Past Surgical History  Procedure Laterality Date  . Back surgery      Laurette Schimke [place], [place], [place]

## 2014-06-09 NOTE — Progress Notes (Signed)
Patient is A/Ox3. Patient had complaint of bilateral leg pain during the shift. NP was notified during a.m.rounds and new orders were written. Patient received prn pain medication, however patient reported poor pain control. E-link MD, Louanna Rawr.Avva was called and notified. New orders were written. Trach care done and old abrasion scabs were noted around the trach area. Patients' daughter also reported that patient appeared to be scratching around her nares near the nasogastric tube and that a rash appeared to be present. Paged Dr.Avva and notified. Was told to continue to monitor and that lotion could be used for itching around nares, and to leave note regarding abrasion scabs.

## 2014-06-10 ENCOUNTER — Inpatient Hospital Stay (HOSPITAL_COMMUNITY): Payer: Commercial Managed Care - HMO

## 2014-06-10 DIAGNOSIS — J101 Influenza due to other identified influenza virus with other respiratory manifestations: Secondary | ICD-10-CM | POA: Insufficient documentation

## 2014-06-10 LAB — BASIC METABOLIC PANEL
ANION GAP: 8 (ref 5–15)
BUN: 19 mg/dL (ref 6–23)
CALCIUM: 8.5 mg/dL (ref 8.4–10.5)
CHLORIDE: 98 mmol/L (ref 96–112)
CO2: 27 mmol/L (ref 19–32)
Creatinine, Ser: 0.53 mg/dL (ref 0.50–1.10)
Glucose, Bld: 214 mg/dL — ABNORMAL HIGH (ref 70–99)
Potassium: 4.1 mmol/L (ref 3.5–5.1)
Sodium: 133 mmol/L — ABNORMAL LOW (ref 135–145)

## 2014-06-10 LAB — CBC
HCT: 29.6 % — ABNORMAL LOW (ref 36.0–46.0)
Hemoglobin: 8.8 g/dL — ABNORMAL LOW (ref 12.0–15.0)
MCH: 26.7 pg (ref 26.0–34.0)
MCHC: 29.7 g/dL — ABNORMAL LOW (ref 30.0–36.0)
MCV: 90 fL (ref 78.0–100.0)
Platelets: 651 10*3/uL — ABNORMAL HIGH (ref 150–400)
RBC: 3.29 MIL/uL — AB (ref 3.87–5.11)
RDW: 17.4 % — ABNORMAL HIGH (ref 11.5–15.5)
WBC: 13.6 10*3/uL — ABNORMAL HIGH (ref 4.0–10.5)

## 2014-06-10 LAB — GLUCOSE, CAPILLARY
GLUCOSE-CAPILLARY: 84 mg/dL (ref 70–99)
GLUCOSE-CAPILLARY: 128 mg/dL — AB (ref 70–99)
Glucose-Capillary: 160 mg/dL — ABNORMAL HIGH (ref 70–99)
Glucose-Capillary: 144 mg/dL — ABNORMAL HIGH (ref 70–99)
Glucose-Capillary: 152 mg/dL — ABNORMAL HIGH (ref 70–99)
Glucose-Capillary: 95 mg/dL (ref 70–99)
Glucose-Capillary: 117 mg/dL — ABNORMAL HIGH (ref 70–99)

## 2014-06-10 LAB — VANCOMYCIN, TROUGH: Vancomycin Tr: 14.1 ug/mL (ref 10.0–20.0)

## 2014-06-10 MED ORDER — FREE WATER
100.0000 mL | Freq: Four times a day (QID) | Status: DC
  Administered 2014-06-10: 100 mL

## 2014-06-10 NOTE — Progress Notes (Signed)
Walked into patient room and observed her NG tube out of place. Stopped tube feeding, extracted NG and tried multiple times to replace the NG. Unable to place NG. MD notified and ordered to report to day nurse to allow MD to assess on morning rounds.

## 2014-06-10 NOTE — Progress Notes (Signed)
CHL IP CLINICAL IMPRESSIONS 06/10/2014  Dysphagia Diagnosis Mild oral phase dysphagia;Mild pharyngeal phase dysphagia  Clinical impression Pt presents with mild oropharyngeal dysphagia characterized by weakness resulting in vallecular residuals across consistencies WITHOUT pt awaerness and trace upper laryngeal penetration of thin liquids. CUED dry swallows effective to clear residuals.  Cued throat clearing/cough effective to remove trace penetration with 75% of opportunities.  Decreased oral manipulation with solids noted resulting in oral pocketing on left - requiring SLP to remove.  Oral residuals of liquids prematurely spill into pharynx with delayed swallow response.   Recommend initiate a clear liquid diet with strict aspiration precautions including PMSV in place for po.  Concern for pt to consume adequate amount of po with level of weakness, dysphagia and fatigue.  SLP  educated pt/family live using monitor and teach back for pt.       CHL IP TREATMENT RECOMMENDATION 06/10/2014  Treatment Plan Recommendations Therapy as outlined in treatment plan below    CHL IP DIET RECOMMENDATION 06/10/2014  Diet Recommendations Thin liquid  Liquid Administration via Cup, NO STRAWS  Medication Administration Via alternative means  Compensations Slow rate;Small sips/bites;Multiple dry swallows after each bite/sip, intermittent cough  Postural Changes and/or Swallow Maneuvers Seated upright 90 degrees;Upright 30-60 min after meal    CHL IP OTHER RECOMMENDATIONS 06/10/2014  Recommended Consults (None)  Oral Care Recommendations Oral care BID  Other Recommendations Have oral suction available;Place PMSV during PO intake     Donavan Burnetamara Deke Tilghman, MS [facility] SLP [phone]

## 2014-06-10 NOTE — Progress Notes (Signed)
Speech Language Pathology Treatment: Hillary BowPassy Muir Speaking valve  Patient Details Name: Jackquline BoschLinda K Schaus MRN: [id number] DOB: [birth year] Today's Date: 06/10/2014 Time: 0950-1010 SLP Time Calculation (min) (ACUTE ONLY): 20 min  Assessment / Plan / Recommendation Clinical Impression  Family reports pt did not wear PMSV yesterday much due to her fatigue and sleeping most of the day.  Today pt reports poor sleep last night and is requesting Pepsi.  SLP encouraged pt to cough and viscous white tinged secretions expectorated via trach after which SLP placed PMSV.  All vitals stable during use of PMSV.  Pt's voice remains hoarse/dysphonic.  Pursed lip breathing noted which daughter states is baseline due to pt's h/o smoking "forever".    Lingual deviation to left (significant) continues and family/pt unable to states if this is her baseline.  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    Recommend continue to use PMSV throughout the day with family/RN - full supervision.  Family demonstrated yesterday donning and removing of PMSV.  Encouraged them to remove valve momentarily to allow expectoration of secretions via tracheal as pt unable to orally expectorate at this time and desire to prevent mucus plug.    Will proceed with MBS today as pt NG is out today to determine if pt appropriate for any po diet.  Advised family to time MBS is scheduled and requested them to bring her valve and dentures.  Family/pt reported understanding.    HPI HPI: 69 [facility] - Pt required vent use from 3/30-4/3 - trach placed - and pt has NG in place.  PMSV and swallow eval ordered.  Pt removed NG tube last pm.  Today seen to assess tolerance of PMSV and readiness for instrumental swallow evaluation.    Pertinent Vitals Pain Assessment:  (leg pain, RN already provided medication - monitored during session)  SLP Plan  MBS (today, scheduled at approximately noon)    Recommendations Diet  recommendations: NPO (pending MBS scheduled today at approximately noon) Medication Administration: Via alternative means      PMSV Supervision: Full (family supervision is adequate) MD: Please consider changing trach tube to : Cuffless       Plan: MBS (today, scheduled at approximately noon)    GO     Donavan Burnetamara Aki Burdin, MS [facility] SLP [phone]

## 2014-06-10 NOTE — Progress Notes (Signed)
ANTIBIOTIC CONSULT NOTE - FOLLOW UP  Pharmacy Consult for Vancomycin, Primaxin Indication: pneumonia  No Known Allergies  Patient Measurements: Height: 5\' 6"  (167.6 cm) Weight: 173 lb 4.5 oz (78.6 kg) IBW/kg (Calculated) : 59.3  Vital Signs: Temp: 99.1 F (37.3 C) (04/05 1100) BP: 112/56 mmHg (04/05 1600) Pulse Rate: 112 (04/05 1035) Intake/Output from previous day: 04/04 0701 - 04/05 0700 In: 2695 [I.V.:230; NG/GT:1715; IV Piggyback:750] Out: 2850 [Urine:2850]  Labs:  Recent Labs  06/08/14 0415 06/09/14 0400 06/10/14 0530  WBC 13.9*  --  13.6*  HGB 8.5*  --  8.8*  PLT 653*  --  651*  CREATININE 0.50 0.48* 0.53   Estimated Creatinine Clearance: 73.2 mL/min (by C-G formula based on Cr of 0.53).  Recent Labs  06/10/14 1735  VANCOTROUGH 14.1    Assessment: 69 yo female smoker admitted 3/15 with H1N1 influenza & pneumococcal PNA and sepsis. Patient completed Tamiflu and antibiotic course last week; however, patient has had continued fevers this week off of antibiotics. Trach aspirate culture collected 3/28, Vancomycin/Zosyn resumed 3/19. Today, culture showing MRSA and E.coli and antibiotics are being adjusted to Vancomycin and Primaxin.  Anti-infectives: 3/15 >> Vanc >> 3/15; restart 3/22 >> 3/25, restart 3/29 >> 3/15 >> Zosyn>> 3/15, restart 3/29 >> 3/30 3/15 >> Ceftriaxone >> 3/25 3/15 >> Azithromycin >> 3/19 3/15 >> Tamiflu >> 3/20 3/31 >> Primaxin >>  4/02 >> Diflucan >>  Today, 06/10/2014:  Tmax: 99.1  WBCs: elevated, 13.6  Renal: Scr 0.53, CrCl~73 ml/min CG, 78 ml/min/1.5873m2 (normalized)  Drug level / dose changes info: 3/24 0900 VT = 23.5 on 1g q12h (reduce to 750mg  q12h) 3/31 1700 VT = 12.6 on 750 q12, increase 1g q12h 4/2 1500 VT = 20.3 on 1g q12, decrease back to 750 q12 4/5 1700 VT = 14.1 on 750mg  q12h  4/5: Day #8 Vanc now 750mg  q12h and D5 Primaxin 500 q6h for MRSA, ESBL E.coli HAP. D#4 Diflucan for candida in trach aspirate.   Vancomycin trough level slightly subtherapeutic.  Will continue dose for now as next dose higher has been supratherapeutic.  Goal of Therapy:  Vancomycin trough level 15-20 mcg/ml Appropriate abx dosing for weight and renal function, eradication of infection.   Plan:   Continue Primaxin 500mg  IV q6h  Continue Vancomycin 750 mg IV q12h.  Follow up renal fxn, culture results, and clinical course.  Grace FilesLeann Cashay Mitchell, PharmD  06/10/2014 6:43 PM

## 2014-06-10 NOTE — Progress Notes (Signed)
PULMONARY / CRITICAL CARE MEDICINE   Name: Grace Mitchell MRN: [id number] DOB: Feb 17, 1946    ADMISSION DATE:  05/20/2014 CONSULTATION DATE:  06/10/2014  REFERRING MD :  EDP  CHIEF COMPLAINT:  Body aches, SOB, chills, fever  INITIAL PRESENTATION:  69 y/o female smoker with dyspnea, fever (Tm103F), chills, malaise from H1N1 influenza & pneumococcal PNA and sepsis.  STUDIES:  3/30  LE Doppler >> Negative for DVT bilaterally   SIGNIFICANT EVENTS: 3/15  admit 3/16  ETT for resp failure 3/17  paralytic for worsening pO2/FIO2 3/26  change to pressure control 3/27  Fever >> change line 3/28  Attempted PSV wean, failed with tachypnea and low lung volumes 3/29  Initially fever improved, then spike to 102 3/30  PEG on hold for fever/staph infection 4/01  Weaning on 10/5, noted culture results  4/03  ATC x 24 hours 4/05  ATCx 48 hours, SLP eval, NGT pulled out overnight   SUBJECTIVE:   ATC 28% x 48 hours, asking for food.  Sitting up with PT at bedside, very weak   VITAL SIGNS: Temp:  [98.4 F (36.9 C)-99.7 F (37.6 C)] 99.1 F (37.3 C) (04/05 1000) Pulse Rate:  [87-116] 112 (04/05 1035) Resp:  [16-31] 31 (04/05 1035) BP: (120-140)/(53-105) 121/58 mmHg (04/05 1035) SpO2:  [93 %-100 %] 100 % (04/05 1035) FiO2 (%):  [28 %] 28 % (04/05 1035) Weight:  [173 lb 4.5 oz (78.6 kg)] 173 lb 4.5 oz (78.6 kg) (04/05 0500)   INTAKE / OUTPUT: Intake/Output      04/04 0701 - 04/05 0700 04/05 0701 - 04/06 0700   I.V. (mL/kg) 230 (2.9) 30 (0.4)   Other     NG/GT 1715    IV Piggyback 750    Total Intake(mL/kg) 2695 (34.3) 30 (0.4)   Urine (mL/kg/hr) 2850 (1.5)    Stool     Total Output 2850     Net -155 +30          PHYSICAL EXAMINATION: General: ill appearing female in no distress, currently ATC Neuro: follows commands, appears stronger, MAE / generalized weakness  HEENT: Trach site c/d/i, clear secretions Cardiovascular: regular, tachycardic Lungs: even/non-labored, lungs bilaterally  with coarse rhonchi Abdomen: soft, non tender Musculoskeletal: 1+ edema Skin: no rashes, R wrist with blistering (improved 4/1), appearance of possible adhesive tear (had a R aline)  LABS:  CBC  Recent Labs Lab 06/07/14 0430 06/08/14 0415 06/10/14 0530  WBC 13.9* 13.9* 13.6*  HGB 7.5* 8.5* 8.8*  HCT 26.0* 29.1* 29.6*  PLT 530* 653* 651*   BMET  Recent Labs Lab 06/08/14 0415 06/09/14 0400 06/10/14 0530  NA 140 136 133*  K 3.7 4.2 4.1  CL 103 102 98  CO2 28 27 27   BUN 22 19 19   CREATININE 0.50 0.48* 0.53  GLUCOSE 141* 214* 214*   Electrolytes  Recent Labs Lab 06/06/14 0530 06/07/14 0430 06/08/14 0415 06/09/14 0400 06/10/14 0530  CALCIUM 8.1* 7.8* 8.6 8.6 8.5  MG 2.2 1.9 1.9  --   --   PHOS 2.8 2.5 3.3  --   --      Glucose  Recent Labs Lab 06/09/14 2029 06/10/14 06/10/14 0337 06/10/14 0733 06/10/14 1306 06/10/14 1326  GLUCAP 176* 160* 144* 152* 84 95    Imaging Dg Swallowing Func-speech Pathology  06/10/2014    Objective Swallowing Evaluation:    Patient Details  Name: Grace Mitchell MRN: [id number] Date of Birth: November 12, 1945  Today's Date: 06/10/2014 Time: SLP Start Time (  ACUTE ONLY): 1210-SLP Stop Time (ACUTE ONLY): 1245 SLP Time Calculation (min) (ACUTE ONLY): 35 min  Past Medical History:  Past Medical History  Diagnosis Date  . Diabetes mellitus   . Hypothyroidism   . Hypertension   . Hyperlipidemia    Past Surgical History:  Past Surgical History  Procedure Laterality Date  . Back surgery     HPI:  HPI: 70 yo adm to [facility] with respiratory failure - Pt required vent use from  3/30-4/3 - trach placed - and pt has NG in place.  PMSV and swallow eval  ordered.  Pt removed NG tube last pm.  Today seen to assess tolerance of  PMSV and readiness for instrumental swallow evaluation.   No Data Recorded  Family and pt report pt has h/o CVA many years ago.    Assessment / Plan / Recommendation CHL IP CLINICAL IMPRESSIONS 06/10/2014  Dysphagia Diagnosis Mild oral phase  dysphagia;Mild pharyngeal phase  dysphagia  Clinical impression Pt presents with mild oropharyngeal dysphagia  characterized by weakness resulting in vallecular residuals across  consistencies WITHOUT pt awaerness and trace upper laryngeal penetration  of thin liquids. CUED dry swallows effective to clear residuals.  Cued  throat clearing/cough effective to remove trace penetration with 75% of  opportunities.  Decreased oral manipulation with solids noted resulting in  oral pocketing on left - requiring SLP to remove.  Oral residuals of  liquids prematurely spill into pharynx with delayed swallow response.   Recommend initiate a clear liquid diet with strict aspiration precautions  including PMSV in place for po.  Concern for pt to consume adequate amount  of po with level of weakness, dysphagia and fatigue.  SLP  educated  pt/family live using monitor and teach back for pt.        CHL IP TREATMENT RECOMMENDATION 06/10/2014  Treatment Plan Recommendations Therapy as outlined in treatment plan below      CHL IP DIET RECOMMENDATION 06/10/2014  Diet Recommendations Thin liquid  Liquid Administration via Cup  Medication Administration Via alternative means  Compensations Slow rate;Small sips/bites;Multiple dry swallows after each  bite/sip, intermittent cough  Postural Changes and/or Swallow Maneuvers Seated upright 90  degrees;Upright 30-60 min after meal     CHL IP OTHER RECOMMENDATIONS 06/10/2014  Recommended Consults (None)  Oral Care Recommendations Oral care BID  Other Recommendations Have oral suction available;Place PMSV during PO  intake     CHL IP FOLLOW UP RECOMMENDATIONS 06/10/2014  Follow up Recommendations (No Data)     CHL IP FREQUENCY AND DURATION 06/10/2014  Speech Therapy Frequency (ACUTE ONLY) min 2x/week  Treatment Duration 2 weeks             CHL IP REASON FOR REFERRAL 06/10/2014  Reason for Referral Objectively evaluate swallowing function     CHL IP ORAL PHASE 06/10/2014  Lips (None)  Tongue (None)  Mucous  membranes (None)  Nutritional status (None)  Other (None)  Oxygen therapy (None)  Oral Phase Impaired  Oral - Pudding Teaspoon (None)  Oral - Pudding Cup (None)  Oral - Honey Teaspoon (None)  Oral - Honey Cup (None)  Oral - Honey Syringe (None)  Oral - Nectar Teaspoon Weak lingual manipulation;Lingual/palatal residue  Oral - Nectar Cup Weak lingual manipulation;Lingual/palatal residue  Oral - Nectar Straw (None)  Oral - Nectar Syringe (None)  Oral - Ice Chips (None)  Oral - Thin Teaspoon Weak lingual manipulation;Lingual/palatal residue  Oral - Thin Cup Weak lingual manipulation;Lingual/palatal residue  Oral - Thin  Straw (None)  Oral - Thin Syringe (None)  Oral - Puree Weak lingual manipulation;Piecemeal swallowing;Delayed oral  transit;Lingual/palatal residue  Oral - Mechanical Soft (None)  Oral - Regular Weak lingual manipulation;Left pocketing in lateral  sulci;Delayed oral transit;Lingual/palatal residue  Oral - Multi-consistency (None)  Oral - Pill (None)  Oral Phase - Comment tongue base residuals requiring verbal cue to swallow       CHL IP PHARYNGEAL PHASE 06/10/2014  Pharyngeal Phase Impaired  Pharyngeal - Pudding Teaspoon (None)  Penetration/Aspiration details (pudding teaspoon) (None)  Pharyngeal - Pudding Cup (None)  Penetration/Aspiration details (pudding cup) (None)  Pharyngeal - Honey Teaspoon (None)  Penetration/Aspiration details (honey teaspoon) (None)  Pharyngeal - Honey Cup (None)  Penetration/Aspiration details (honey cup) (None)  Pharyngeal - Honey Syringe (None)  Penetration/Aspiration details (honey syringe) (None)  Pharyngeal - Nectar Teaspoon Reduced laryngeal elevation;Delayed swallow  initiation;Reduced epiglottic inversion;Pharyngeal residue -  valleculae;Reduced tongue base retraction;Penetration/Aspiration during  swallow  Penetration/Aspiration details (nectar teaspoon) Material enters airway,  remains ABOVE vocal cords and not ejected out  Pharyngeal - Nectar Cup Reduced laryngeal  elevation;Delayed swallow  initiation;Reduced epiglottic inversion;Pharyngeal residue -  valleculae;Reduced tongue base retraction;Penetration/Aspiration during  swallow  Penetration/Aspiration details (nectar cup) Material enters airway,  remains ABOVE vocal cords and not ejected out  Pharyngeal - Nectar Straw (None)  Penetration/Aspiration details (nectar straw) (None)  Pharyngeal - Nectar Syringe (None)  Penetration/Aspiration details (nectar syringe) (None)  Pharyngeal - Ice Chips (None)  Penetration/Aspiration details (ice chips) (None)  Pharyngeal - Thin Teaspoon Reduced laryngeal elevation;Delayed swallow  initiation;Reduced epiglottic inversion;Pharyngeal residue -  valleculae;Reduced tongue base retraction;Penetration/Aspiration during  swallow  Penetration/Aspiration details (thin teaspoon) Material enters airway,  remains ABOVE vocal cords and not ejected out  Pharyngeal - Thin Cup Reduced laryngeal elevation;Delayed swallow  initiation;Reduced epiglottic inversion;Pharyngeal residue -  valleculae;Reduced tongue base retraction;Penetration/Aspiration during  swallow;Reduced airway/laryngeal closure  Penetration/Aspiration details (thin cup) Material enters airway, remains  ABOVE vocal cords and not ejected out  Pharyngeal - Thin Straw (None)  Penetration/Aspiration details (thin straw) (None)  Pharyngeal - Thin Syringe (None)  Penetration/Aspiration details (thin syringe') (None)  Pharyngeal - Puree Reduced laryngeal elevation;Delayed swallow  initiation;Reduced epiglottic inversion;Pharyngeal residue -  valleculae;Reduced tongue base retraction  Penetration/Aspiration details (puree) Material does not enter airway  Pharyngeal - Mechanical Soft (None)  Penetration/Aspiration details (mechanical soft) (None)  Pharyngeal - Regular Reduced laryngeal elevation;Delayed swallow  initiation;Reduced epiglottic inversion;Pharyngeal residue -  valleculae;Reduced tongue base retraction;Reduced airway/laryngeal  closure   Penetration/Aspiration details (regular) Material does not enter airway  Pharyngeal - Multi-consistency (None)  Penetration/Aspiration details (multi-consistency) (None)  Pharyngeal - Pill (None)  Penetration/Aspiration details (pill) (None)  Pharyngeal Comment CUED dry swallows effective to decrease residuals     CHL IP CERVICAL ESOPHAGEAL PHASE 06/10/2014  Cervical Esophageal Phase Impaired  Pudding Teaspoon (None)  Pudding Cup (None)  Honey Teaspoon (None)  Honey Cup (None)  Honey Syringe (None)  Nectar Teaspoon WFL  Nectar Cup (None)  Nectar Straw (None)  Nectar Syringe (None)  Thin Teaspoon WFL  Thin Cup (None)  Thin Straw (None)  Thin Syringe (None)  Cervical Esophageal Comment pt with appearance of adequate clearance with  sweep x1 - radiologist not present to confirm    No flowsheet data found.         Donavan Burnetamara Kimball, MS [facility] SLP [phone]        ASSESSMENT / PLAN:  PULMONARY ET 3/16>>3/24 Janina Mayorach 3/24>> A: Acute respiratory failure with ARDS 2nd to H1N1 and Pneumococcal PNA .  Tracheostomy status Tobacco abuse. New E coli and MRSA HCAP P:   Goal ATC indefinitely F/u CXR intermittently  Likely LTAC vs SNF for rehab PMV trials, swallowing eval ordered  CARDIOVASCULAR Lt IJ CVL 3/16 >> 3/27 RUE PICC 3/27 >>  A:  Sepsis 2nd to PNA >> resolved. Tachycardia - suspect fever related  Hx HTN, HLD. P:  ASA, zocor Lopressor 25 BID Diuresis as she can tolerate, start maintenance dose lasix 20 mg 4/3 and increase as able  RENAL A:   AKI from sepsis >> resolved. Hypernatremia - improving Hypokalemia  Hypervolemia P:   Reduce free water to 100 ml Q6  Replace electrolytes as needed Lasix as above  GASTROINTESTINAL A:   Nutrition Dysphagia P:   NGT pulled out 4/5 early am, SLP cleared for clear liquid diet.  Hold reinsertion and follow intake Protonix for SUP  HEMATOLOGIC A:   Anemia of critical illness. P:  F/u CBC DVT: lovenox  INFECTIOUS A:    H1N1/Pneumococcal pneumonia >> completed Abx 3/25. New Fever 3/27 - replaced lines 3/27, ruled out DVT 3/29,  KUB wnl.   P:   Pneumococcal Ag 3/15 >> positive Influenza PCR 3/15>> H1N1 pos Blood 3/22 >> neg Blood 3/28 >> neg Sputum 3/28 >> MRSA (sens vanco)+ E-Coli (sens imipenem) UA 3/28 >> neg HIV 3/29 >> nr  Vanco 3/29 >>  Zosyn 3/29 >> 3/30 Imipenem 3/31 >>  Diflucan 4/2 >>   D8/x abx  D4/x antifungal  ENDOCRINE CBG (last 3)  A:   Hx of DM with Neuropathy, hypothyroidism. P:   Continue synthroid SSI Hold lantus & TF coverage with reduced PO intake and TF held   NEUROLOGIC A:   Anxiety, insomnia, chronic pain. 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 P:   PT D/C fentanyl PRN percocet for pain PRN ativan for anxiety    Likely would be good candidate for LTAC placement but may be too well.  SNF rehab vs LTAC.  RN Case mgr working on placement.  Patient meets 21 days on 4/5 and will be eligible for transfer to LTAC.  Still has long way for recovery.     Canary Brim, NP-C [facility] Pgr: [phone] or [phone] 06/10/2014, 2:52 PM  Attending Note:  I have examined patient, reviewed labs, studies and notes. I have discussed the case with B Ollis, and I agree with the data and plans as amended above.  Levy Pupa, MD, PhD 06/10/2014, 6:13 PM [facility] [phone] or if no answer [phone]

## 2014-06-10 NOTE — Progress Notes (Signed)
ANTIBIOTIC CONSULT NOTE - FOLLOW UP  Pharmacy Consult for Vancomycin, Primaxin Indication: pneumonia  No Known Allergies  Patient Measurements: Height: 5\' 6"  (167.6 cm) Weight: 173 lb 4.5 oz (78.6 kg) IBW/kg (Calculated) : 59.3  Vital Signs: Temp: 99.1 F (37.3 C) (04/05 1000) Temp Source: Core (Comment) (04/05 0400) BP: 121/58 mmHg (04/05 1035) Pulse Rate: 112 (04/05 1035) Intake/Output from previous day: 04/04 0701 - 04/05 0700 In: 2695 [I.V.:230; NG/GT:1715; IV Piggyback:750] Out: 2850 [Urine:2850]  Labs:  Recent Labs  06/08/14 0415 06/09/14 0400 06/10/14 0530  WBC 13.9*  --  13.6*  HGB 8.5*  --  8.8*  PLT 653*  --  651*  CREATININE 0.50 0.48* 0.53   Estimated Creatinine Clearance: 73.2 mL/min (by C-G formula based on Cr of 0.53).  Recent Labs  06/07/14 1447  VANCOTROUGH 20.3*    Assessment: 69 yo female smoker admitted 3/15 with H1N1 influenza & pneumococcal PNA and sepsis. Patient completed Tamiflu and antibiotic course last week; however, patient has had continued fevers this week off of antibiotics. Trach aspirate culture collected 3/28, Vancomycin/Zosyn resumed 3/19. Today, culture showing MRSA and E.coli and antibiotics are being adjusted to Vancomycin and Primaxin.  Anti-infectives: 3/15 >> Vanc >> 3/15; restart 3/22 >> 3/25, restart 3/29 >> 3/15 >> Zosyn>> 3/15, restart 3/29 >> 3/30 3/15 >> Ceftriaxone >> 3/25 3/15 >> Azithromycin >> 3/19 3/15 >> Tamiflu >> 3/20 3/31 >> Primaxin >>  4/02 >> Diflucan >>  Today, 06/10/2014:  Tmax: 99.1  WBCs: elevated, 13.6  Renal: Scr 0.53, CrCl~73 ml/min CG, 78 ml/min/1.6073m2 (normalized)  Drug level / dose changes info: 3/24 0900 VT = 23.5 on 1g q12h (reduce to 750mg  q12h) 3/31 1700 VT = 12.6 on 750 q12, increase 1g q12h 4/2 1500 VT = 20.3 on 1g q12, decrease back to 750 q12 4/5 1700 VT = ____ on 750mg  q12h  4/5: Day #8 Vanc now 750mg  q12h and D5 Primaxin 500 q6h for MRSA, ESBL E.coli HAP. D#4  Diflucan for candida in trach aspirate.   Goal of Therapy:  Vancomycin trough level 15-20 mcg/ml Appropriate abx dosing for weight and renal function, eradication of infection.   Plan:   Continue Primaxin 500mg  IV q6h  Continue Vancomycin 750 mg IV q12h.  Recheck Vanc trough trough tonight at 17:00  Follow up renal fxn, culture results, and clinical course.  Grace Mitchell, PharmD, BCPS Pager: [phone]  06/10/2014 2:26 PM

## 2014-06-11 ENCOUNTER — Inpatient Hospital Stay (HOSPITAL_COMMUNITY): Payer: Commercial Managed Care - HMO

## 2014-06-11 LAB — CBC
HEMATOCRIT: 28.9 % — AB (ref 36.0–46.0)
HEMOGLOBIN: 8.7 g/dL — AB (ref 12.0–15.0)
MCH: 27.2 pg (ref 26.0–34.0)
MCHC: 30.1 g/dL (ref 30.0–36.0)
MCV: 90.3 fL (ref 78.0–100.0)
Platelets: 615 10*3/uL — ABNORMAL HIGH (ref 150–400)
RBC: 3.2 MIL/uL — ABNORMAL LOW (ref 3.87–5.11)
RDW: 17.7 % — AB (ref 11.5–15.5)
WBC: 10.5 10*3/uL (ref 4.0–10.5)

## 2014-06-11 LAB — BASIC METABOLIC PANEL
ANION GAP: 7 (ref 5–15)
BUN: 14 mg/dL (ref 6–23)
CHLORIDE: 102 mmol/L (ref 96–112)
CO2: 28 mmol/L (ref 19–32)
Calcium: 8.9 mg/dL (ref 8.4–10.5)
Creatinine, Ser: 0.51 mg/dL (ref 0.50–1.10)
GFR calc non Af Amer: >90 mL/min (ref >90)
Glucose, Bld: 75 mg/dL (ref 70–99)
POTASSIUM: 3.9 mmol/L (ref 3.5–5.1)
SODIUM: 137 mmol/L (ref 135–145)

## 2014-06-11 LAB — GLUCOSE, CAPILLARY
Glucose-Capillary: 124 mg/dL — ABNORMAL HIGH (ref 70–99)
Glucose-Capillary: 78 mg/dL (ref 70–99)
Glucose-Capillary: 95 mg/dL (ref 70–99)

## 2014-06-11 MED ORDER — POTASSIUM CHLORIDE CRYS ER 20 MEQ PO TBCR
40.0000 meq | EXTENDED_RELEASE_TABLET | Freq: Two times a day (BID) | ORAL | Status: AC
  Administered 2014-06-11 – 2014-06-12 (×4): 40 meq via ORAL
  Filled 2014-06-11 (×4): qty 2

## 2014-06-11 MED ORDER — FUROSEMIDE 40 MG PO TABS
40.0000 mg | ORAL_TABLET | Freq: Two times a day (BID) | ORAL | Status: DC
  Administered 2014-06-11 – 2014-06-12 (×2): 40 mg
  Filled 2014-06-11 (×2): qty 1

## 2014-06-11 NOTE — Progress Notes (Signed)
Clinical Social Work Department BRIEF PSYCHOSOCIAL ASSESSMENT 06/11/2014  Patient:  Grace Mitchell, Grace Mitchell     Account Number:  [id number]     Admit date:  05/20/2014  Clinical Social Worker:  Maryln Manuel  Date/Time:  06/11/2014 09:45 AM  Referred by:  Physician  Date Referred:  06/11/2014 Referred for  SNF Placement   Other Referral:   Interview type:  Patient Other interview type:   and patient family at bedside    PSYCHOSOCIAL DATA Living Status:  [place] Admitted from facility:   Level of care:   Primary support name:  Grace Mitchell/husband/[phone] Primary support relationship to patient:  SPOUSE Degree of support available:   strong    CURRENT CONCERNS Current Concerns  Post-Acute Placement   Other Concerns:    SOCIAL WORK ASSESSMENT / PLAN CSW received referral for New SNF.    CSW met with pt, pt husband, and pt children at bedside. CSW introduced self and explained role. CSW discussed with pt and pt family that initially it appeared that pt was going to qualify for LTAC, but pt has now progressed past the need for LTAC and rehab efforts will be most appropriate in SNF setting. Pt and pt spouse expressed understanding. CSW discussed process of SNF search and explained that there may be limited facility options secondary to pt needing trach care and pt insurance [employer] Medicare Gold. Pt spouse expressed that he wants to ensure that pt goes to a facility that will take good care of her. Pt agreeable to [facility] search. CSW provided support as pt family discussed how they are eager to see pt continued progression as they are pleased to see pt making slow progress.    CSW completed FL2 and initiated SNF search to [facility].    CSW to follow up with pt and pt famiy re: SNF bed offers.    CSW to continue to follow to provide support and assist with pt disposition needs.   Assessment/plan status:  Psychosocial Support/Ongoing Assessment of Needs Other  assessment/ plan:   discharge planning   Information/referral to community resources:   [facility] list    PATIENT'S/FAMILY'S RESPONSE TO PLAN OF CARE: Pt alert and oriented x 4. Pt has PMSV and able to appropriately participate in assessment. Pt has strong family support and pt is in good spirits at this time. Pt spouse and pt children have been a constant presence at bedside for support for pt. Pt and pt family eager to know options in order to make a decision regarding rehab at [facility].   Alison Murray, MSW, [place] Work [phone]

## 2014-06-11 NOTE — Progress Notes (Signed)
CSW continuing to follow.   CSW followed up with pt at bedside to provide SNF bed offers. [facility] Pt has two facilities considering, [facility] and [facility]. CSW reviewed bed offers with pt and clarified pt questions. Pt family not present at bedside at this time, but pt agreeable to CSW contacting pt husband to update.   CSW contacted pt husband via telephone. CSW updated pt husband on bed offers. Pt husband plans to discuss with pt family and may tour facilities that offered a bed and notify this CSW of decision.  CSW to continue to follow to provide support and assist with pt discharge planning needs.   Loletta SpecterSuzanna Kidd, MSW, LCSW Clinical Social Work [phone]

## 2014-06-11 NOTE — Progress Notes (Signed)
Speech Language Pathology Treatment: Dysphagia;Passy Muir Speaking valve  Patient Details Name: Grace Mitchell MRN: [id number] DOB: [birth year] Today's Date: 06/11/2014 Time: [id number] SLP Time Calculation (min) (ACUTE ONLY): 28 min  Assessment / Plan / Recommendation Clinical Impression  Pt sitting partially upright in bed upon SLP entrance to room.  She coughed and expectorated mildly reddish tinged secretions via trach.  Per pt she consumed jello approximately one hour ago.  Pt denies coughing associated with intake and verbalized swallow precautions.  Also noted pt with mild red coating on tongue - ? Aspiration of secretions vs jello.  Pt continues to be afebrile, CXR consistent with previous and she is able to expectorate secretions via trach.  Spoke to MD re: this finding and given pt with all VSS, MD agrees for pt to continue clear liquids currently.   Pt with good tolerance of PMSV during SLP session with much improved phonatory strength!!!!  Do not recommend pt have valve on alone due to sheer amount of viscous secretions that SLP has yet to see expectorated orally with PMSV in place.  SLP to follow for readiness for advancement to full independence with valve.  SLP to follow up to teach pt to don/remove PMSV.   Continue SLP.  Thanks for allowing me to help with this pt's care plan.    HPI HPI: 69 [facility] - Pt required vent use from 3/30-4/3 - trach placed - and pt has NG in place.  PMSV and swallow eval ordered.  Pt removed NG tube last pm.  Today seen to assess tolerance of PMSV and readiness for instrumental swallow evaluation.    Pertinent Vitals Pain Assessment: No/denies pain  SLP Plan  MBS    Recommendations Diet recommendations: Thin liquid (continue clears with close monitoring) Medication Administration:  (crushed with liquids if not contraindicated) Supervision: Patient able to self feed;Full supervision/cueing for compensatory  strategies Compensations: Slow rate;Small sips/bites;Multiple dry swallows after each bite/sip (cough/clear throat intermittently) Postural Changes and/or Swallow Maneuvers: Seated upright 90 degrees;Upright 30-60 min after meal      Patient may use Passy-Muir Speech Valve: During all therapies with supervision;Intermittently with supervision;with SLP only;Caregiver trained to provide supervision PMSV Supervision: Full (family supervision is adequate) MD: Please consider changing trach tube to : Cuffless       Oral Care Recommendations: Oral care Q4 per protocol Follow up Recommendations: Skilled Nursing facility Plan: MBS    GO     Donavan Burnetamara Mareta Chesnut, MS [facility] SLP [phone]

## 2014-06-11 NOTE — Progress Notes (Signed)
PULMONARY / CRITICAL CARE MEDICINE   Name: Grace Mitchell MRN: [id number] DOB: [birth year]    ADMISSION DATE:  05/20/2014 CONSULTATION DATE:  06/11/2014  REFERRING MD :  EDP  CHIEF COMPLAINT:  Body aches, SOB, chills, fever  INITIAL PRESENTATION:  69 y/o female smoker with dyspnea, fever (Tm103F), chills, malaise from H1N1 influenza & pneumococcal PNA and sepsis.  STUDIES:  3/30  LE Doppler >> Negative for DVT bilaterally   SIGNIFICANT EVENTS: 3/15  admit 3/16  ETT for resp failure 3/17  paralytic for worsening pO2/FIO2 3/26  change to pressure control 3/27  Fever >> change line 3/28  Attempted PSV wean, failed with tachypnea and low lung volumes 3/29  Initially fever improved, then spike to 102 3/30  PEG on hold for fever/staph infection 4/01  Weaning on 10/5, noted culture results  4/03  ATC x 24 hours 4/05  ATCx 48 hours, SLP eval, NGT pulled out overnight   SUBJECTIVE:   Tolerating ATC, improving strength.  No acute events.  Family/patient report she had limited mobility prior to admission - used a walker to make it to the restroom. She was mostly bed to chair.  Her husband is her primary caregiver.    VITAL SIGNS: Temp:  [98.2 F (36.8 C)-99.1 F (37.3 C)] 98.4 F (36.9 C) (04/06 0600) Pulse Rate:  [90-112] 96 (04/06 0324) Resp:  [6-32] 12 (04/06 0600) BP: (112-144)/(54-65) 136/65 mmHg (04/06 0539) SpO2:  [92 %-100 %] 100 % (04/06 0600) FiO2 (%):  [28 %] 28 % (04/06 0539) Weight:  [163 lb 5.8 oz (74.1 kg)] 163 lb 5.8 oz (74.1 kg) (04/06 0500)   INTAKE / OUTPUT: Intake/Output      04/05 0701 - 04/06 0700 04/06 0701 - 04/07 0700   P.O. 300    I.V. (mL/kg) 210 (2.8)    NG/GT 100    IV Piggyback 750    Total Intake(mL/kg) 1360 (18.4)    Urine (mL/kg/hr) 1975 (1.1)    Total Output 1975     Net -615            PHYSICAL EXAMINATION: General: ill appearing female in no distress, currently ATC Neuro: follows commands, appears stronger, MAE / generalized weakness   HEENT: Trach site c/d/i, clear secretions, voice strong with PMV Cardiovascular: s1s2 rrr, no m/r/g Lungs: even/non-labored, lungs bilaterally diminished  Abdomen: soft, non tender Musculoskeletal: 1+ edema Skin: no rashes, R wrist with resolving blistering, appearance of possible adhesive tear (had a R aline)  LABS:  CBC  Recent Labs Lab 06/08/14 0415 06/10/14 0530 06/11/14 0459  WBC 13.9* 13.6* 10.5  HGB 8.5* 8.8* 8.7*  HCT 29.1* 29.6* 28.9*  PLT 653* 651* 615*   BMET  Recent Labs Lab 06/09/14 0400 06/10/14 0530 06/11/14 0459  NA 136 133* 137  K 4.2 4.1 3.9  CL 102 98 102  CO2 27 27 28   BUN 19 19 14   CREATININE 0.48* 0.53 0.51  GLUCOSE 214* 214* 75   Electrolytes  Recent Labs Lab 06/06/14 0530 06/07/14 0430 06/08/14 0415 06/09/14 0400 06/10/14 0530 06/11/14 0459  CALCIUM 8.1* 7.8* 8.6 8.6 8.5 8.9  MG 2.2 1.9 1.9  --   --   --   PHOS 2.8 2.5 3.3  --   --   --      Glucose  Recent Labs Lab 06/10/14 1306 06/10/14 1326 06/10/14 1543 06/10/14 2019 06/10/14 2351 06/11/14 0323  GLUCAP 84 95 128* 117* 124* 78    Imaging Dg Chest [facility]  1 View  06/11/2014   CLINICAL DATA:  Respiratory failure.  EXAM: PORTABLE CHEST - 1 VIEW  COMPARISON:  06/07/2014.  FINDINGS: Tracheostomy tube in stable position. Right PICC line in stable position. Mediastinum and hilar structures are normal. Heart size is stable. Diffuse bilateral airspace disease is present. No interim change .  IMPRESSION: 1. Lines and tubes in stable position. 2. Diffuse bilateral airspace disease.  No interim change.   Electronically Signed   By: Maisie Fus  Register   On: 06/11/2014 07:10   Dg Swallowing Func-speech Pathology  06/10/2014    Objective Swallowing Evaluation:    Patient Details  Name: Grace Mitchell MRN: [id number] Date of Birth: Feb 06, 1946  Today's Date: 06/10/2014 Time: SLP Start Time (ACUTE ONLY): 1210-SLP Stop Time (ACUTE ONLY): 1245 SLP Time Calculation (min) (ACUTE ONLY): 35 min  Past  Medical History:  Past Medical History  Diagnosis Date  . Diabetes mellitus   . Hypothyroidism   . Hypertension   . Hyperlipidemia    Past Surgical History:  Past Surgical History  Procedure Laterality Date  . Back surgery     HPI:  HPI: 69 yo adm to [facility] with respiratory failure - Pt required vent use from  3/30-4/3 - trach placed - and pt has NG in place.  PMSV and swallow eval  ordered.  Pt removed NG tube last pm.  Today seen to assess tolerance of  PMSV and readiness for instrumental swallow evaluation.   No Data Recorded  Family and pt report pt has h/o CVA many years ago.    Assessment / Plan / Recommendation CHL IP CLINICAL IMPRESSIONS 06/10/2014  Dysphagia Diagnosis Mild oral phase dysphagia;Mild pharyngeal phase  dysphagia  Clinical impression Pt presents with mild oropharyngeal dysphagia  characterized by weakness resulting in vallecular residuals across  consistencies WITHOUT pt awaerness and trace upper laryngeal penetration  of thin liquids. CUED dry swallows effective to clear residuals.  Cued  throat clearing/cough effective to remove trace penetration with 75% of  opportunities.  Decreased oral manipulation with solids noted resulting in  oral pocketing on left - requiring SLP to remove.  Oral residuals of  liquids prematurely spill into pharynx with delayed swallow response.   Recommend initiate a clear liquid diet with strict aspiration precautions  including PMSV in place for po.  Concern for pt to consume adequate amount  of po with level of weakness, dysphagia and fatigue.  SLP  educated  pt/family live using monitor and teach back for pt.        CHL IP TREATMENT RECOMMENDATION 06/10/2014  Treatment Plan Recommendations Therapy as outlined in treatment plan below      CHL IP DIET RECOMMENDATION 06/10/2014  Diet Recommendations Thin liquid  Liquid Administration via Cup  Medication Administration Via alternative means  Compensations Slow rate;Small sips/bites;Multiple dry swallows after each  bite/sip,  intermittent cough  Postural Changes and/or Swallow Maneuvers Seated upright 90  degrees;Upright 30-60 min after meal     CHL IP OTHER RECOMMENDATIONS 06/10/2014  Recommended Consults (None)  Oral Care Recommendations Oral care BID  Other Recommendations Have oral suction available;Place PMSV during PO  intake     CHL IP FOLLOW UP RECOMMENDATIONS 06/10/2014  Follow up Recommendations (No Data)     CHL IP FREQUENCY AND DURATION 06/10/2014  Speech Therapy Frequency (ACUTE ONLY) min 2x/week  Treatment Duration 2 weeks             CHL IP REASON FOR REFERRAL 06/10/2014  Reason for Referral  Objectively evaluate swallowing function     CHL IP ORAL PHASE 06/10/2014  Lips (None)  Tongue (None)  Mucous membranes (None)  Nutritional status (None)  Other (None)  Oxygen therapy (None)  Oral Phase Impaired  Oral - Pudding Teaspoon (None)  Oral - Pudding Cup (None)  Oral - Honey Teaspoon (None)  Oral - Honey Cup (None)  Oral - Honey Syringe (None)  Oral - Nectar Teaspoon Weak lingual manipulation;Lingual/palatal residue  Oral - Nectar Cup Weak lingual manipulation;Lingual/palatal residue  Oral - Nectar Straw (None)  Oral - Nectar Syringe (None)  Oral - Ice Chips (None)  Oral - Thin Teaspoon Weak lingual manipulation;Lingual/palatal residue  Oral - Thin Cup Weak lingual manipulation;Lingual/palatal residue  Oral - Thin Straw (None)  Oral - Thin Syringe (None)  Oral - Puree Weak lingual manipulation;Piecemeal swallowing;Delayed oral  transit;Lingual/palatal residue  Oral - Mechanical Soft (None)  Oral - Regular Weak lingual manipulation;Left pocketing in lateral  sulci;Delayed oral transit;Lingual/palatal residue  Oral - Multi-consistency (None)  Oral - Pill (None)  Oral Phase - Comment tongue base residuals requiring verbal cue to swallow       CHL IP PHARYNGEAL PHASE 06/10/2014  Pharyngeal Phase Impaired  Pharyngeal - Pudding Teaspoon (None)  Penetration/Aspiration details (pudding teaspoon) (None)  Pharyngeal - Pudding Cup (None)   Penetration/Aspiration details (pudding cup) (None)  Pharyngeal - Honey Teaspoon (None)  Penetration/Aspiration details (honey teaspoon) (None)  Pharyngeal - Honey Cup (None)  Penetration/Aspiration details (honey cup) (None)  Pharyngeal - Honey Syringe (None)  Penetration/Aspiration details (honey syringe) (None)  Pharyngeal - Nectar Teaspoon Reduced laryngeal elevation;Delayed swallow  initiation;Reduced epiglottic inversion;Pharyngeal residue -  valleculae;Reduced tongue base retraction;Penetration/Aspiration during  swallow  Penetration/Aspiration details (nectar teaspoon) Material enters airway,  remains ABOVE vocal cords and not ejected out  Pharyngeal - Nectar Cup Reduced laryngeal elevation;Delayed swallow  initiation;Reduced epiglottic inversion;Pharyngeal residue -  valleculae;Reduced tongue base retraction;Penetration/Aspiration during  swallow  Penetration/Aspiration details (nectar cup) Material enters airway,  remains ABOVE vocal cords and not ejected out  Pharyngeal - Nectar Straw (None)  Penetration/Aspiration details (nectar straw) (None)  Pharyngeal - Nectar Syringe (None)  Penetration/Aspiration details (nectar syringe) (None)  Pharyngeal - Ice Chips (None)  Penetration/Aspiration details (ice chips) (None)  Pharyngeal - Thin Teaspoon Reduced laryngeal elevation;Delayed swallow  initiation;Reduced epiglottic inversion;Pharyngeal residue -  valleculae;Reduced tongue base retraction;Penetration/Aspiration during  swallow  Penetration/Aspiration details (thin teaspoon) Material enters airway,  remains ABOVE vocal cords and not ejected out  Pharyngeal - Thin Cup Reduced laryngeal elevation;Delayed swallow  initiation;Reduced epiglottic inversion;Pharyngeal residue -  valleculae;Reduced tongue base retraction;Penetration/Aspiration during  swallow;Reduced airway/laryngeal closure  Penetration/Aspiration details (thin cup) Material enters airway, remains  ABOVE vocal cords and not ejected out   Pharyngeal - Thin Straw (None)  Penetration/Aspiration details (thin straw) (None)  Pharyngeal - Thin Syringe (None)  Penetration/Aspiration details (thin syringe') (None)  Pharyngeal - Puree Reduced laryngeal elevation;Delayed swallow  initiation;Reduced epiglottic inversion;Pharyngeal residue -  valleculae;Reduced tongue base retraction  Penetration/Aspiration details (puree) Material does not enter airway  Pharyngeal - Mechanical Soft (None)  Penetration/Aspiration details (mechanical soft) (None)  Pharyngeal - Regular Reduced laryngeal elevation;Delayed swallow  initiation;Reduced epiglottic inversion;Pharyngeal residue -  valleculae;Reduced tongue base retraction;Reduced airway/laryngeal closure   Penetration/Aspiration details (regular) Material does not enter airway  Pharyngeal - Multi-consistency (None)  Penetration/Aspiration details (multi-consistency) (None)  Pharyngeal - Pill (None)  Penetration/Aspiration details (pill) (None)  Pharyngeal Comment CUED dry swallows effective to decrease residuals     CHL IP CERVICAL ESOPHAGEAL PHASE 06/10/2014  Cervical Esophageal Phase Impaired  Pudding Teaspoon (None)  Pudding Cup (None)  Honey Teaspoon (None)  Honey Cup (None)  Honey Syringe (None)  Nectar Teaspoon WFL  Nectar Cup (None)  Nectar Straw (None)  Nectar Syringe (None)  Thin Teaspoon WFL  Thin Cup (None)  Thin Straw (None)  Thin Syringe (None)  Cervical Esophageal Comment pt with appearance of adequate clearance with  sweep x1 - radiologist not present to confirm    No flowsheet data found.         Donavan Burnetamara Kimball, MS [facility] SLP [phone]        ASSESSMENT / PLAN:  PULMONARY ET 3/16>>3/24 Janina Mayorach 3/24>> A: Acute respiratory failure with ARDS 2nd to H1N1 and Pneumococcal PNA . Tracheostomy status Tobacco abuse. New E coli and MRSA HCAP P:   Goal ATC indefinitely F/u CXR intermittently  Likely SNF for rehab PMV trials, SLP following   CARDIOVASCULAR Lt IJ CVL 3/16 >> 3/27 RUE PICC 3/27 >>  A:   Sepsis 2nd to PNA >> resolved. Tachycardia - suspect fever related  Hx HTN, HLD. P:  ASA, zocor Lopressor 25 BID Diuresis as she can tolerate, increase lasix 40 mg BID x 4 doses + KCL (4/6).  Monitor I/O's  RENAL A:   AKI from sepsis >> resolved. Hypernatremia - improving Hypokalemia  Hypervolemia P:   Discontinue free water  Replace electrolytes as needed Lasix as above  GASTROINTESTINAL A:   Nutrition Dysphagia P:   NGT pulled out 4/5 early am, SLP cleared for clear liquid diet.  Hold reinsertion and follow intake Protonix for SUP Advance diet per SLP as tolerated   HEMATOLOGIC A:   Anemia of critical illness. P:  F/u CBC DVT: lovenox  INFECTIOUS A:   H1N1/Pneumococcal pneumonia >> completed Abx 3/25. New Fever 3/27 - replaced lines 3/27, ruled out DVT 3/29,  KUB wnl.   P:   Pneumococcal Ag 3/15 >> positive Influenza PCR 3/15>> H1N1 pos Blood 3/22 >> neg Blood 3/28 >> neg Sputum 3/28 >> MRSA (sens vanco)+ E-Coli (sens imipenem) UA 3/28 >> neg HIV 3/29 >> nr  Vanco 3/29 >>  Zosyn 3/29 >> 3/30 Imipenem 3/31 >>  Diflucan 4/2 >> 4/6  D9/10 abx  D5/5 antifungal, on 4/6  Plan to d/c abx after 4/7 dose  ENDOCRINE CBG (last 3)  A:   Hx of DM with Neuropathy, hypothyroidism. P:   Continue synthroid SSI Hold lantus & TF coverage with reduced PO intake and TF held   NEUROLOGIC A:   Anxiety, insomnia, chronic pain. 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 P:   PT Fentanyl PRN for severe pain PRN percocet for pain PRN ativan for anxiety  Mobilize  Patient is hopeful to go home.  Likely will need several weeks of PT prior to discharge home (SNF).  RN Case mgr working on placement.    Canary BrimBrandi Ollis, NP-C [facility] Pgr: [phone] or [phone] 06/11/2014, 8:17 AM   Attending Note:  I have examined patient, reviewed labs, studies and notes. I have discussed the case with B Ollis, and I agree with the  data and plans as amended above.  Levy Pupaobert Zoie Sarin, MD, PhD 06/11/2014, 2:02 PM [facility] [phone] or if no answer [phone]

## 2014-06-11 NOTE — Progress Notes (Signed)
NUTRITION FOLLOW-UP  DOCUMENTATION CODES Per approved criteria  -Not Applicable   INTERVENTION: - Diet advancement per SLP recommendations - Nutritional supplements to be added as diet advanced.  - RD will continue to monitor.   NUTRITION DIAGNOSIS: Inadequate oral intake related to inability to eat as evidenced by NPO; ongoing  Goal: Pt to meet >/= 90% of their estimated nutrition needs; not currently met  Monitor:  Weight trend, labs/CBGs, diet advancement/SLP recommendations  69 y.o. female  Admitting Dx: H1N1, pneumonia  ASSESSMENT: 69 y.o. F brought to [facility] ED 3/15 with 4 day hx of generalized body aches, SOB, subjective fevers, chills. In ED, had fever to 103, was placed on NRB for hypoxia and had RR in high 30's. PCCM called for admission due to concern for potential of deterioration.   3/16: - Pt intubated 3/16 - Spoke with husband who reports that pt as eating well until she started to get sick about 2 weeks ago. No significant weight loss. Pt's usual body weight is 165 lbs.  - No signs of fat or muscle wasting.   3/18: - Pt with H1N1 and Pneumonia. Running low grade fever.  - CBGs elevated- Lantus increased per MD note. Followed by Diabetes Coordinator.  - Pt fluid overloaded (+8.7 L since admission.) Wt increased 12 lb from 3/17 to 3/18. Re-weighed during RD visit to confirm. On Lasix. Per RN, urine output increasing.   3/23: Lurline Idol placed, failed to wean  4/4: - Pt s/p MBS 4/5. Pt's diet upgraded to clear liquids.  - Using PMSV.  - No longer on vent - SLP following for PMSV tolerance and diet advancement. - Labs and medications reviewed  - RD will continue to monitor for diet advancement.   Height: Ht Readings from Last 1 Encounters:  05/29/14 '5\' 6"'  (1.676 m)   Weight: Wt Readings from Last 1 Encounters:  06/11/14 163 lb 5.8 oz (74.1 kg)   BMI:  Body mass index is 26.38 kg/(m^2).  Estimated Nutritional Needs: Kcal: 1800-2000 Protein: 100-115  g Fluid: 1.8 L/day  Skin: two small skin tears on sacrum  Diet Order: Diet clear liquid Room service appropriate?: Yes with Assist; Fluid consistency:: Thin  EDUCATION NEEDS: -Education not appropriate at this time   Intake/Output Summary (Last 24 hours) at 06/11/14 1426 Last data filed at 06/11/14 1300  Gross per 24 hour  Intake   1470 ml  Output   2150 ml  Net   -680 ml    Last BM: 4/4  Labs:   Recent Labs Lab 06/06/14 0530 06/07/14 0430 06/08/14 0415 06/09/14 0400 06/10/14 0530 06/11/14 0459  NA 144 143 140 136 133* 137  K 3.3* 3.8 3.7 4.2 4.1 3.9  CL 111 106 103 102 98 102  CO2 '26 25 28 27 27 28  ' BUN 26* '23 22 19 19 14  ' CREATININE 0.68 0.61 0.50 0.48* 0.53 0.51  CALCIUM 8.1* 7.8* 8.6 8.6 8.5 8.9  MG 2.2 1.9 1.9  --   --   --   PHOS 2.8 2.5 3.3  --   --   --   GLUCOSE 208* 223* 141* 214* 214* 75    CBG (last 3)   Recent Labs  06/10/14 2351 06/11/14 0323 06/11/14 0726  GLUCAP 124* 78 95    Scheduled Meds: . amitriptyline  10 mg Oral QHS  . antiseptic oral rinse  7 mL Mouth Rinse QID  . aspirin  81 mg Oral Daily  . bacitracin  1 application Topical BID  .  chlorhexidine  15 mL Mouth Rinse BID  . enoxaparin (LOVENOX) injection  40 mg Subcutaneous Q24H  . furosemide  40 mg Per Tube BID  . imipenem-cilastatin  500 mg Intravenous 4 times per day  . insulin aspart  0-20 Units Subcutaneous 6 times per day  . levothyroxine  150 mcg Per Tube QAC breakfast  . metoprolol tartrate  25 mg Oral BID  . pantoprazole sodium  40 mg Per Tube Q24H  . potassium chloride  40 mEq Oral BID  . simvastatin  20 mg Per Tube QPM  . sodium chloride  10-40 mL Intracatheter Q12H  . vancomycin  750 mg Intravenous Q12H    Continuous Infusions: . feeding supplement (VITAL AF 1.2 CAL) Stopped (06/10/14 0400)    Past Medical History  Diagnosis Date  . Diabetes mellitus   . Hypothyroidism   . Hypertension   . Hyperlipidemia     Past Surgical History  Procedure  Laterality Date  . Back surgery      Laurette Schimke [place], [place], [place]

## 2014-06-11 NOTE — Progress Notes (Addendum)
Clinical Social Work Department CLINICAL SOCIAL WORK PLACEMENT NOTE 06/11/2014  Patient:  [name],Grace K  Account Number:  [id number] Admit date:  05/20/2014  Clinical Social Worker:  Garlan FairSUZANNA [facility], LCSWA  Date/time:  06/11/2014 10:00 AM  Clinical Social Work is seeking post-discharge placement for this patient at the following level of care:   SKILLED NURSING   (*CSW will update this form in Epic as items are completed)   06/11/2014  Patient/family provided with [facility] Department of Clinical Social Work's list of facilities offering this level of care within the geographic area requested by the patient (or if unable, by the patient's family).  06/11/2014  Patient/family informed of their freedom to choose among providers that offer the needed level of care, that participate in Medicare, Medicaid or managed care program needed by the patient, have an available bed and are willing to accept the patient.  06/11/2014  Patient/family informed of [facility]' ownership interest in [facility], as well as of the fact that they are under no obligation to receive care at this facility.  PASARR submitted to EDS on 06/11/2014 PASARR number received on 06/11/2014  FL2 transmitted to all facilities in geographic area requested by pt/family on  06/11/2014 FL2 transmitted to all facilities within larger geographic area on   Patient informed that his/her managed care company has contracts with or will negotiate with  certain facilities, including the following:     Patient/family informed of bed offers received:  06/11/2014 [facility] Physician recommends and patient chooses bed at    [facility] on 06/13/2014 Patient to be transferred to facility by ambulance Sharin Mons([facility]AR) Patient and family notified of transfer on 06/13/2014 Name of family member notified:  Pt and pt daughter, Selena BattenKim notified at bedside.  The  following physician request were entered in Epic:   Additional Comments:   Loletta SpecterSuzanna Tranice Laduke, MSW, LCSW Clinical Social Work [phone]

## 2014-06-12 LAB — BASIC METABOLIC PANEL
ANION GAP: 8 (ref 5–15)
BUN: 13 mg/dL (ref 6–23)
CHLORIDE: 101 mmol/L (ref 96–112)
CO2: 27 mmol/L (ref 19–32)
CREATININE: 0.57 mg/dL (ref 0.50–1.10)
Calcium: 9.1 mg/dL (ref 8.4–10.5)
GFR calc Af Amer: >90 mL/min (ref >90)
GFR calc non Af Amer: >90 mL/min (ref >90)
Glucose, Bld: 81 mg/dL (ref 70–99)
POTASSIUM: 4.5 mmol/L (ref 3.5–5.1)
Sodium: 136 mmol/L (ref 135–145)

## 2014-06-12 LAB — GLUCOSE, CAPILLARY
GLUCOSE-CAPILLARY: 74 mg/dL (ref 70–99)
GLUCOSE-CAPILLARY: 239 mg/dL — AB (ref 70–99)
GLUCOSE-CAPILLARY: 229 mg/dL — AB (ref 70–99)
Glucose-Capillary: 121 mg/dL — ABNORMAL HIGH (ref 70–99)
Glucose-Capillary: 124 mg/dL — ABNORMAL HIGH (ref 70–99)
Glucose-Capillary: 208 mg/dL — ABNORMAL HIGH (ref 70–99)

## 2014-06-12 LAB — CBC
HCT: 30.6 % — ABNORMAL LOW (ref 36.0–46.0)
Hemoglobin: 9.1 g/dL — ABNORMAL LOW (ref 12.0–15.0)
MCH: 26.8 pg (ref 26.0–34.0)
MCHC: 29.7 g/dL — ABNORMAL LOW (ref 30.0–36.0)
MCV: 90 fL (ref 78.0–100.0)
Platelets: 556 10*3/uL — ABNORMAL HIGH (ref 150–400)
RBC: 3.4 MIL/uL — ABNORMAL LOW (ref 3.87–5.11)
RDW: 17.6 % — AB (ref 11.5–15.5)
WBC: 9.3 10*3/uL (ref 4.0–10.5)

## 2014-06-12 MED ORDER — GABAPENTIN 100 MG PO CAPS
100.0000 mg | ORAL_CAPSULE | Freq: Two times a day (BID) | ORAL | Status: DC
  Administered 2014-06-12 – 2014-06-13 (×3): 100 mg via ORAL
  Filled 2014-06-12 (×4): qty 1

## 2014-06-12 MED ORDER — METOPROLOL TARTRATE 25 MG PO TABS
25.0000 mg | ORAL_TABLET | Freq: Two times a day (BID) | ORAL | Status: DC
  Administered 2014-06-12 – 2014-06-13 (×2): 25 mg via ORAL
  Filled 2014-06-12 (×3): qty 1

## 2014-06-12 MED ORDER — PANTOPRAZOLE SODIUM 40 MG PO TBEC
40.0000 mg | DELAYED_RELEASE_TABLET | Freq: Every day | ORAL | Status: DC
  Administered 2014-06-13: 40 mg via ORAL
  Filled 2014-06-12: qty 1

## 2014-06-12 MED ORDER — INSULIN ASPART 100 UNIT/ML ~~LOC~~ SOLN
0.0000 [IU] | Freq: Three times a day (TID) | SUBCUTANEOUS | Status: DC
  Administered 2014-06-12 – 2014-06-13 (×4): 5 [IU] via SUBCUTANEOUS

## 2014-06-12 MED ORDER — FUROSEMIDE 40 MG PO TABS
40.0000 mg | ORAL_TABLET | Freq: Every day | ORAL | Status: DC
  Administered 2014-06-13: 40 mg via ORAL
  Filled 2014-06-12: qty 1

## 2014-06-12 NOTE — Progress Notes (Addendum)
CSW continuing to follow.  CSW followed up with pt and pt husband at bedside.   Pt husband discussed that pt and pt family were able to discuss SNF bed offers and [facility]. Pt expressed agreement in this plan.   [facility] contacted [facility] and confirmed that facility would have private room bed availability tomorrow, Friday 06/13/2014. Per [facility], facility request for pt trach to be changed to uncuffed.   CSW spoke with PCCM NP, Florentina AddisonKatie and notified of pt and pt family choice of [facility] and that facility will need trach uncuffed. Per PCCM NP, plan will be to change pt trach to uncuffed today and make sure pt tolerates and advance pt diet based on SLP treatment today and hopeful if pt tolerates these things that pt can be discharged [facility].   CSW contacted pt insurance, [employer] Case Manager, Corrie DandyMary and notified of plan for hopeful discharge to [facility] tomorrow. CSW received insurance authorization from [facility] for pt [facility] to [facility] when medically ready. Electronics engineer(Auth #: [id number])  CSW to continue to follow to provide support and assist with pt disposition needs [facility] when pt medically stable.  Loletta SpecterSuzanna Kidd, MSW, LCSW Clinical Social Work [phone]

## 2014-06-12 NOTE — Progress Notes (Signed)
Received report from ICU RN, Pt arrived unit, alert and oriented, accompanied by family. Has a Trach with PMV.  Will continue with current plan of care.

## 2014-06-12 NOTE — Progress Notes (Signed)
Physical Therapy Treatment Patient Details Name: Jackquline BoschLinda K Tirey MRN: [id number] DOB: June 18, 1945 Today's Date: 06/12/2014    History of Present Illness 69 yo female admitted with respiratory failure, ARDS, Pneumococcal Pna, H1N1. Intubated 3/16. Trach 3/23. PICC 3/27.  Hx of DM, HTN.    PT Comments    Patient is communicating with PMV, patient remains profoundly weak,  Decreased dorsiflexion,  Has plantarflexion, decreased strength to attempt to stand. Per chart, plans for SNF.  Follow Up Recommendations  SNF     Equipment Recommendations  None recommended by PT    Recommendations for Other Services       Precautions / Restrictions Precautions Precautions: Fall Precaution Comments: profound weakness, expecially legs    Mobility  Bed Mobility   Bed Mobility: Rolling;Sidelying to Sit;Sit to Sidelying Rolling: Max assist;+2 for safety/equipment;+2 for physical assistance Sidelying to sit: Max assist;+2 for physical assistance;+2 for safety/equipment     Sit to sidelying: Total assist;+2 for physical assistance;+2 for safety/equipment General bed mobility comments: initiates moving legs towards edge but unable to assist legs onto bed, against gravity.   Transfers Overall transfer level: Needs assistance   Transfers: Sit to/from Stand Sit to Stand: +2 physical assistance;+2 safety/equipment         General transfer comment: attempted from bed but unable to  get weight bearing on legs, also trunk control poor for balance. OOB via maxi sky  Ambulation/Gait                 Stairs            Wheelchair Mobility    Modified Rankin (Stroke Patients Only)       Balance Overall balance assessment: Needs assistance Sitting-balance support: Feet supported;Bilateral upper extremity supported Sitting balance-Leahy Scale: Zero Sitting balance - Comments: sat at edge x 8 minutes, requires external assist.                            Cognition  Arousal/Alertness: Awake/alert Behavior During Therapy: Anxious                        Exercises      General Comments        Pertinent Vitals/Pain Pain Assessment: Faces Faces Pain Scale: Hurts even more Pain Location: both legs Pain Descriptors / Indicators: Aching;Cramping;Discomfort;Grimacing Pain Intervention(s): Monitored during session;Premedicated before session    Home Living                      Prior Function            PT Goals (current goals can now be found in the care plan section) Progress towards PT goals: Progressing toward goals    Frequency  Min 3X/week    PT Plan Discharge plan needs to be updated    Co-evaluation             End of Session   Activity Tolerance: No increased pain;Patient limited by fatigue Patient left: in bed;with call bell/phone within reach     Time: 1143-1218 PT Time Calculation (min) (ACUTE ONLY): 35 min  Charges:  $Therapeutic Activity: 23-37 mins                    G Codes:      Sharen HeckHill, Edmund Rick Elizabeth Toshio Slusher PT [phone]  06/12/2014, 4:12 PM

## 2014-06-12 NOTE — Progress Notes (Signed)
PULMONARY / CRITICAL CARE MEDICINE   Name: Grace BoschLinda K Sinatra MRN: [id number] DOB: 1945-07-14    ADMISSION DATE:  05/20/2014 CONSULTATION DATE:  06/12/2014  REFERRING MD :  EDP  CHIEF COMPLAINT:  Body aches, SOB, chills, fever  INITIAL PRESENTATION:  69 y/o female smoker with dyspnea, fever (Tm103F), chills, malaise from H1N1 influenza & pneumococcal PNA and sepsis.  STUDIES:  3/30  LE Doppler >> Negative for DVT bilaterally   SIGNIFICANT EVENTS: 3/15  admit 3/16  ETT for resp failure 3/17  paralytic for worsening pO2/FIO2 3/26  change to pressure control 3/27  Fever >> change line 3/28  Attempted PSV wean, failed with tachypnea and low lung volumes 3/29  Initially fever improved, then spike to 102 3/30  PEG on hold for fever/staph infection 4/01  Weaning on 10/5, noted culture results  4/03  ATC x 24 hours 4/05  ATCx 48 hours, SLP eval, NGT pulled out overnight   SUBJECTIVE:   Tolerating ATC with PMV.  Wants to go home. Tolerating PO clear liquid diet.   VITAL SIGNS: Temp:  [97.4 F (36.3 C)-99.1 F (37.3 C)] 98.1 F (36.7 C) (04/07 0800) Pulse Rate:  [90-98] 96 (04/07 0802) Resp:  [12-26] 18 (04/07 0802) BP: (90-126)/(47-67) 125/58 mmHg (04/07 0802) SpO2:  [97 %-100 %] 99 % (04/07 0800) FiO2 (%):  [28 %] 28 % (04/07 0802) Weight:  [162 lb 11.2 oz (73.8 kg)] 162 lb 11.2 oz (73.8 kg) (04/07 0422)   INTAKE / OUTPUT: Intake/Output      04/06 0701 - 04/07 0700 04/07 0701 - 04/08 0700   P.O. 270    I.V. (mL/kg) 240 (3.3) 20 (0.3)   NG/GT     IV Piggyback 750    Total Intake(mL/kg) 1260 (17.1) 20 (0.3)   Urine (mL/kg/hr) 2175 (1.2)    Total Output 2175     Net -915 +20          PHYSICAL EXAMINATION: General: chronically ill appearing female in no distress, currently ATC Neuro: follows commands, alert and appropriate, MAE / generalized weakness  HEENT: Trach site c/d/i, clear secretions, voice strong with PMV Cardiovascular: s1s2 rrr, no m/r/g Lungs: even/non-labored  on ATC with PMV, lungs bilaterally diminished  Abdomen: soft, non tender Musculoskeletal: 1+ edema Skin: no rashes, R wrist with resolving blistering  LABS:  CBC  Recent Labs Lab 06/10/14 0530 06/11/14 0459 06/12/14 0440  WBC 13.6* 10.5 9.3  HGB 8.8* 8.7* 9.1*  HCT 29.6* 28.9* 30.6*  PLT 651* 615* 556*   BMET  Recent Labs Lab 06/10/14 0530 06/11/14 0459 06/12/14 0440  NA 133* 137 136  K 4.1 3.9 4.5  CL 98 102 101  CO2 27 28 27   BUN 19 14 13   CREATININE 0.53 0.51 0.57  GLUCOSE 214* 75 81   Electrolytes  Recent Labs Lab 06/06/14 0530 06/07/14 0430 06/08/14 0415  06/10/14 0530 06/11/14 0459 06/12/14 0440  CALCIUM 8.1* 7.8* 8.6  < > 8.5 8.9 9.1  MG 2.2 1.9 1.9  --   --   --   --   PHOS 2.8 2.5 3.3  --   --   --   --   < > = values in this interval not displayed.   Glucose  Recent Labs Lab 06/10/14 2351 06/11/14 0323 06/11/14 0726 06/12/14 0002 06/12/14 0432 06/12/14 0745  GLUCAP 124* 78 95 121* 74 124*    Imaging CXR  No new CXR 4/7    ASSESSMENT / PLAN:  PULMONARY ET  3/16>>3/24 Trach 3/24>> A: Acute respiratory failure with ARDS 2nd to H1N1 and Pneumococcal PNA . Tracheostomy status Tobacco abuse. New E coli and MRSA HCAP P:   Goal ATC indefinitely F/u CXR intermittently  Likely SNF for rehab Cont PMV as tol  SLP following  Consider downsize prior to d/c  F/u in trach clinic post d/c - likely good candidate for decannulation once strength improved  CARDIOVASCULAR Lt IJ CVL 3/16 >> 3/27 RUE PICC 3/27 >>  A:  Sepsis 2nd to PNA >> resolved. Tachycardia - suspect fever related  Hx HTN, HLD. P:  Continue ASA, zocor Lopressor 25 BID Continue diuresis as tol, will taper to  PO daily beginning 4/8 Monitor I/O's  RENAL A:   AKI from sepsis >> resolved. Hypernatremia - improving Hypokalemia  Hypervolemia P:   Replace electrolytes as needed Lasix as above  GASTROINTESTINAL A:   Nutrition Dysphagia P:   Cont diet  per speech recs - ??advance Protonix for SUP   HEMATOLOGIC A:   Anemia of critical illness. P:  F/u CBC DVT: lovenox  INFECTIOUS A:   H1N1/Pneumococcal pneumonia >> completed Abx 3/25. New Fever 3/27 - replaced lines 3/27, ruled out DVT 3/29,  KUB wnl.   P:   Pneumococcal Ag 3/15 >> positive Influenza PCR 3/15>> H1N1 pos Blood 3/22 >> neg Blood 3/28 >> neg Sputum 3/28 >> MRSA (sens vanco)+ E-Coli (sens imipenem) UA 3/28 >> neg HIV 3/29 >> nr  Vanco 3/29 >> 4/7 Zosyn 3/29 >> 3/30 Imipenem 3/31 >> 4/7 Diflucan 4/2 >> 4/6  Abx complete 4/7 - monitor wbc, fever curve off abx   ENDOCRINE CBG (last 3)  A:   Hx of DM with Neuropathy, hypothyroidism. P:   Continue synthroid SSI - change to ac/hs   NEUROLOGIC A:   Anxiety, insomnia, chronic pain. 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 P:   PT D/c PRN fentanyl  Add gabapentin 4/7  PRN percocet for pain PRN ativan for anxiety  Mobilize/ PT/OT   Patient is hopeful to go home.  Likely will need several weeks of PT prior to discharge home (SNF).   Awaiting SNF decision.   Tx floor 4/7 and await SNF d/c.    Dirk Dress, NP 06/12/2014  9:41 AM Pager: [phone] or [phone]    Attending Note:  I have examined patient, reviewed labs, studies and notes. I have discussed the case with Jasper Riling, and I agree with the data and plans as amended above.  Levy Pupa, MD, PhD 06/12/2014, 2:27 PM [facility] [phone] or if no answer [phone]

## 2014-06-12 NOTE — Progress Notes (Signed)
Speech Language Pathology Treatment: Dysphagia;Passy Muir Speaking valve  Patient Details Name: Grace Mitchell MRN: [id number] DOB: 06/21/1945 Today's Date: 06/12/2014 Time: 1324-40100940-1012 SLP Time Calculation (min) (ACUTE ONLY): 32 min  Assessment / Plan / Recommendation Clinical Impression  Pt today complains of being "hungry" - breakfast tray at bedside.  SlP observed pt consuming graham cracker, ice cream, and juice.  Delayed oral transiting noted with lingual pocketing on left of solids. Pt required cues to clear oral residuals.   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  No overt indications of aspiration.  Pt continues to require max cues to conduct dry swallows although she can verbalize indication.  Daughter reports mother conducting strategy last pm with her at dinner.   Great tolerance of valve and pt reports using valve all morning.  SLP removed PMSV and pt coughed and expectorated viscous white tinged secretions.   Advised family that when pt sounds "gurgly" without valve to encourage her to cough to expectorate and replace valve.  Did not instruct pt to removal and donning of PMSV due to her limited ability to manipulate.    Continue full supervision with PMSV and po.  Recommend continue SLP to maximize airway protection and advance diet as appropriate.  SLP to follow up.    HPI HPI: 69 [facility] - Pt required vent use from 3/30-4/3 - trach placed - and pt has NG in place.  PMSV and swallow eval ordered.  Pt has been on a clear liquid diet with strict precautions/pmsv use.  SLP follow up to determine tolerance and readiness for dietary advancement.    Pertinent Vitals Pain Assessment:  (pain in legs, RN notified) Pain Intervention(s): Patient requesting pain meds-RN notified (RN notified at end of session)  SLP Plan  Continue with current plan of care    Recommendations Diet recommendations:  Dysphagia 2 (fine chop);Thin liquid Liquids provided via: Cup;No straw Medication Administration: Whole meds with puree Supervision: Patient able to self feed;Full supervision/cueing for compensatory strategies Compensations: Slow rate;Small sips/bites;Multiple dry swallows after each bite/sip Postural Changes and/or Swallow Maneuvers: Seated upright 90 degrees;Upright 30-60 min after meal      Patient may use Passy-Muir Speech Valve: During all therapies with supervision;Intermittently with supervision;with SLP only;Caregiver trained to provide supervision PMSV Supervision: Full MD: Please consider changing trach tube to : Cuffless       Oral Care Recommendations: Oral care Q4 per protocol Follow up Recommendations: Skilled Nursing facility Plan: Continue with current plan of care    GO     Donavan Burnetamara Giovana Faciane, MS [facility] SLP [phone]

## 2014-06-13 LAB — BASIC METABOLIC PANEL
Anion gap: 7 (ref 5–15)
BUN: 15 mg/dL (ref 6–23)
CALCIUM: 9.1 mg/dL (ref 8.4–10.5)
CO2: 26 mmol/L (ref 19–32)
Chloride: 98 mmol/L (ref 96–112)
Creatinine, Ser: 0.63 mg/dL (ref 0.50–1.10)
GFR calc non Af Amer: >90 mL/min (ref >90)
Glucose, Bld: 217 mg/dL — ABNORMAL HIGH (ref 70–99)
POTASSIUM: 4.9 mmol/L (ref 3.5–5.1)
SODIUM: 131 mmol/L — AB (ref 135–145)

## 2014-06-13 LAB — GLUCOSE, CAPILLARY
GLUCOSE-CAPILLARY: 163 mg/dL — AB (ref 70–99)
GLUCOSE-CAPILLARY: 157 mg/dL — AB (ref 70–99)
GLUCOSE-CAPILLARY: 208 mg/dL — AB (ref 70–99)
Glucose-Capillary: 139 mg/dL — ABNORMAL HIGH (ref 70–99)
Glucose-Capillary: 211 mg/dL — ABNORMAL HIGH (ref 70–99)

## 2014-06-13 LAB — CBC
HCT: 30.7 % — ABNORMAL LOW (ref 36.0–46.0)
Hemoglobin: 9.2 g/dL — ABNORMAL LOW (ref 12.0–15.0)
MCH: 27.3 pg (ref 26.0–34.0)
MCHC: 30 g/dL (ref 30.0–36.0)
MCV: 91.1 fL (ref 78.0–100.0)
PLATELETS: 532 10*3/uL — AB (ref 150–400)
RBC: 3.37 MIL/uL — ABNORMAL LOW (ref 3.87–5.11)
RDW: 17.7 % — ABNORMAL HIGH (ref 11.5–15.5)
WBC: 8.1 10*3/uL (ref 4.0–10.5)

## 2014-06-13 MED ORDER — ENSURE ENLIVE PO LIQD: 237.0000 mL | Freq: Two times a day (BID) | ORAL | Status: AC

## 2014-06-13 MED ORDER — OXYCODONE-ACETAMINOPHEN 5-325 MG PO TABS: 1.0000 | ORAL_TABLET | Freq: Four times a day (QID) | ORAL | Status: AC | PRN

## 2014-06-13 MED ORDER — METOPROLOL TARTRATE 25 MG PO TABS: 25.0000 mg | ORAL_TABLET | Freq: Two times a day (BID) | ORAL | Status: AC

## 2014-06-13 MED ORDER — GABAPENTIN 100 MG PO CAPS: 100.0000 mg | ORAL_CAPSULE | Freq: Two times a day (BID) | ORAL | Status: AC

## 2014-06-13 MED ORDER — INSULIN ASPART 100 UNIT/ML ~~LOC~~ SOLN: SUBCUTANEOUS | Status: AC

## 2014-06-13 MED ORDER — AMITRIPTYLINE HCL 10 MG PO TABS
10.0000 mg | ORAL_TABLET | Freq: Every day | ORAL | Status: AC
Start: 2014-06-13 — End: ?

## 2014-06-13 MED ORDER — ENSURE ENLIVE PO LIQD
237.0000 mL | Freq: Two times a day (BID) | ORAL | Status: DC
  Administered 2014-06-13: 237 mL via ORAL

## 2014-06-13 MED ORDER — FUROSEMIDE 40 MG PO TABS: 20.0000 mg | ORAL_TABLET | Freq: Every day | ORAL | Status: AC

## 2014-06-13 MED ORDER — INSULIN ASPART 100 UNIT/ML ~~LOC~~ SOLN: SUBCUTANEOUS | Status: DC

## 2014-06-13 NOTE — Progress Notes (Signed)
Pt for discharge [facility].  CSW facilitated pt discharge needs including contacting facility, faxing pt discharge information via [facility], discussing with pt and pt daughter, Selena BattenKim at bedside, providing RN phone number to call report, and arranging ambulance transport via [facility] for [facility]. [employer] authorization received Electronics engineer(Auth #: [id number]).  Pt coping appropriately with transition [facility]. Pt was hopeful to be able to return home, but recognizes that she has not progressed enough yet to be safe at home. Pt expressed that she is motivated to participate at rehab in order to be able to return home with pt husband. Pt husband and pt daughter, Selena BattenKim remain supportive of pt and her recovery.   No further social work needs identified at this time.  CSW signing off.   Loletta SpecterSuzanna Edder Bellanca, MSW, LCSW Clinical Social Work [phone]

## 2014-06-13 NOTE — Progress Notes (Signed)
Physical Therapy Treatment Patient Details Name: Grace Mitchell MRN: [id number] DOB: [birth year] Today's Date: 06/13/2014    History of Present Illness 69 yo female admitted with respiratory failure, ARDS, Pneumococcal Pna, H1N1. Intubated 3/16. Trach 3/23. PICC 3/27.  Hx of DM, HTN.    PT Comments    Pt progressing with sitting balance, less assist required today to maintain neutral position though continues to have posterior lean frequently. She sat on EOB x 8 min with min to max assist and performed BLE exercises.   Follow Up Recommendations  SNF     Equipment Recommendations  None recommended by PT    Recommendations for Other Services       Precautions / Restrictions Precautions Precautions: Fall Precaution Comments: profound weakness, expecially legs    Mobility  Bed Mobility   Bed Mobility: Rolling;Sidelying to Sit;Sit to Sidelying Rolling: Max assist;+2 for safety/equipment;+2 for physical assistance Sidelying to sit: Max assist;+2 for physical assistance;+2 for safety/equipment     Sit to sidelying: Total assist;+2 for physical assistance;+2 for safety/equipment General bed mobility comments: initiates moving legs towards edge but unable to assist legs onto bed, against gravity. Assist to raise trunk, verbal cues for technique.  Transfers                    Ambulation/Gait                 Stairs            Wheelchair Mobility    Modified Rankin (Stroke Patients Only)       Balance   Sitting-balance support: Bilateral upper extremity supported;Feet supported Sitting balance-Leahy Scale: Zero Sitting balance - Comments: sat at edge x 8 minutes, required min/guard to max assist, frequent verbal/manual cues to engage abdominal muscles to come to neutral, tends to lean posteriorly                            Cognition Arousal/Alertness: Awake/alert Behavior During Therapy: WFL for tasks assessed/performed Overall Cognitive  Status: Within Functional Limits for tasks assessed                      Exercises General Exercises - Lower Extremity Long Arc Quad: 5 reps;Both;AAROM;Seated Other Exercises Other Exercises: cervical AROM -rotation, flexion, extension x 5 reps B in sitting    General Comments        Pertinent Vitals/Pain Pain Assessment: Faces Faces Pain Scale: Hurts even more Pain Location: BLEs Pain Descriptors / Indicators: Sore Pain Intervention(s): Patient requesting pain meds-RN notified;Monitored during session    Home Living                      Prior Function            PT Goals (current goals can now be found in the care plan section) Acute Rehab PT Goals Patient Stated Goal: to care for her grandchildren PT Goal Formulation: With patient Time For Goal Achievement: 06/16/14 Potential to Achieve Goals: Fair Progress towards PT goals: Progressing toward goals    Frequency  Min 3X/week    PT Plan Current plan remains appropriate    Co-evaluation             End of Session   Activity Tolerance: No increased pain;Patient limited by fatigue Patient left: in bed;with call bell/phone within reach;with family/visitor present     Time: [id number] PT Time Calculation (  min) (ACUTE ONLY): 30 min  Charges:  $Therapeutic Activity: 23-37 mins                    G Codes:      Tamala Ser 06/13/2014, 11:40 AM [phone]

## 2014-06-13 NOTE — Progress Notes (Signed)
Two rt's changed out trach from #6 cuffed Shiley to a #4 cuffless Shiley. Pt had good color change on ezcap, equal BS,SATs 100% on 28% tc, hr 88, pt able to talk with pmv on trach, no distress noted at this time.

## 2014-06-13 NOTE — Progress Notes (Signed)
Pt left at this time with EMS; headed to [facility]. Family present and aware of transfer. Pt alert and oriented.

## 2014-06-13 NOTE — Progress Notes (Signed)
Inpatient Diabetes Program Recommendations  AACE/ADA: New Consensus Statement on Inpatient Glycemic Control (2013)  Target Ranges:  Prepandial:   less than 140 mg/dL      Peak postprandial:   less than 180 mg/dL (1-2 hours)      Critically ill patients:  140 - 180 mg/dL     Results for Jackquline BoschRICH, Jamicia K (MRN [id number]) as of 06/13/2014 10:46  Ref. Range 06/12/2014 07:45 06/12/2014 12:25 06/12/2014 17:59 06/12/2014 20:20  Glucose-Capillary Latest Range: 70-99 mg/dL 696124 (H) 295239 (H) 284208 (H) 229 (H)    Results for Jackquline BoschRICH, Jailey K (MRN [id number]) as of 06/13/2014 10:46  Ref. Range 06/13/2014 07:29  Glucose-Capillary Latest Range: 70-99 mg/dL 725208 (H)    Outpatient Diabetes medications: Lantus 40 units q AM and 60 units q HS            Janumet 50-500 mg one daily    Current DM Orders:  Novolog Moderate SSI tidwc    **Note Tube feeds stopped on 04/05.  **Patient started on Dysphagic diet yesterday (04/07).  **Elevated glucose levels all day yesterday.     MD- Please consider adding a very small portion of patient's home dose of Lantus-  Lantus 15 units QHS (15% of home dose)     Will follow Ambrose FinlandJeannine Johnston Tiaira Arambula RN, MSN, CDE Diabetes Coordinator Inpatient Diabetes Program Team Pager: [phone] (8a-5p)

## 2014-06-13 NOTE — Discharge Summary (Signed)
Physician Discharge Summary  Patient ID: KARLEE STAFF MRN: [id number] DOB/AGE: 1945-09-05 69 y.o.  Admit date: 05/20/2014 Discharge date: 06/13/2014    Discharge Diagnoses:  Acute Respiratory Failure with ARDS secondary to H1N1 & Pneumococcal PNA Tracheostomy Status Tobacco Abuse E-Coli, MRSA HCAP  Sepsis secondary to PNA Tachycardia  HTN Hyperlipidemia  Acute Kidney Injury in setting of Sepsis  Hypernatremia  Hypokalemia Hypervolemia  Dysphagia  Anemia of Critical Illness  Diabetes Mellitus Diabetic Neuropathy  Hypothyroidsim Anxiety Insomnia  Chronic Pain  Deconditioning                                                                        DISCHARGE PLAN BY DIAGNOSIS     Acute Respiratory Failure with ARDS secondary to H1N1 & Pneumococcal PNA - resolved. Tracheostomy Status Tobacco Abuse E-Coli, MRSA HCAP - resolved.   Discharge Plan:  Continue #4 cuffless trach Trach care QD with inner cannula change Follow up in [facility] for possible decannulation  PMV use as directed by Speech Therapy  Patient should not sleep with PMV in place.  Please remove QHS!    Sepsis secondary to PNA - resolved Tachycardia - resolved  HTN Hyperlipidemia   Discharge Plan:  Continue lopressor, lasix 20 mg QD Follow up with PCP upon SNF discharge   Acute Kidney Injury in setting of Sepsis - resolved.  Hypernatremia  - resolved.  Hypokalemia - resolved.  Hypervolemia - resolved.   Discharge Plan:  BMP in one week post hospital discharge to follow up sr cr / electrolytes   Dysphagia   Discharge Plan:  Continue SLP recommendations as below:   Diet recommendations: Dysphagia 2 (fine chop);Thin liquid Liquids provided via: Cup;No straw Medication Administration: Whole meds with puree Supervision: Patient able to self feed;Full supervision/cueing for compensatory strategies Compensations: Slow rate;Small sips/bites;Multiple dry swallows after  each bite/sip Postural Changes and/or Swallow Maneuvers: Seated upright 90 degrees;Upright 30-60 min after meal        Anemia of Critical Illness   Discharge Plan:  CBC in one week post hospital discharge   Diabetes Mellitus Diabetic Neuropathy  Hypothyroidism  Discharge Plan:  Continue sliding scale insulin at discharge Hold long acting insulin.  Consider addition of long acting as patients PO intake continues to improve.  Continue synthroid Continue neurontin for diabetic neuropathy  Anxiety Insomnia  Chronic Pain  Deconditioning   Discharge Plan:  Continue amitriptyline, gabapentin, PRN percocet                   DISCHARGE SUMMARY   Grace Mitchell is a 69 y.o. y/o female with a PMH of hypothyroidism, HLD, HTN, Diabetes (on insulin) with peripheral neuropathy who was admitted 3/15 to [facility] with a 10 day history of generalized body aches, SOB, subjective fevers/chills and decreased PO intake.  On initial presentation, she had fever of 103, tachypnea and hypoxia.  The patient was admitted to ICU for close observation.  She unfortunately deteriorated on 3/16 and required intubation / mechanical ventilation.  The patient developed ARDS in the setting of H1N1 and pneumococcal PNA.  ICU stay was prolonged due to slow weaning, septic shock and AKI (all resolved).  She developed persistent fevers (  3/27) and was found to have an E-Coli and MRSA HCAP and was treated with antibiotics as below.  The patient has completed antibiotics for PNA.  She began to make slow progress with weaning 4/1 and ultimately was weaned to aerosolized trach collar on 4/3 without difficulty.  She made significant improvement with PT efforts. The patient was evaluated by Speech Therapy and has tolerated PMV usage well.  She has progressed to a D2, thin liquid diet (see rec's as above).  Prior to discharge, she was downsized to a #4 cuffless tracheostomy.  The patient was medically cleared for  discharge 4/8 with plans as above.    Prior to admission, she was minimally ambulatory due to diabetic neuropathy.  Her husband is her primary caretaker.  She was using a walker to ambulate to the restroom at times.  They are hopeful to get back to her previous baseline.                SIGNIFICANT EVENTS: 3/15 admit 3/16 ETT for resp failure 3/17 paralytic for worsening pO2/FIO2 3/26 change to pressure control 3/27 Fever >> change line 3/28 Attempted PSV wean, failed with tachypnea and low lung volumes 3/29 Initially fever improved, then spike to 102 3/30 PEG on hold for fever/staph infection 4/01 Weaning on 10/5, noted culture results  4/03 ATC x 24 hours 4/05 ATCx 48 hours, SLP eval, NGT pulled out overnight  SIGNIFICANT DIAGNOSTIC STUDIES   MICRO DATA  Pneumococcal Ag 3/15 >> positive Influenza PCR 3/15>> H1N1 pos Blood 3/22 >> neg Blood 3/28 >> neg Sputum 3/28 >> MRSA (sens vanco)+ E-Coli (sens imipenem) UA 3/28 >> neg HIV 3/29 >> nr  ANTIBIOTICS Vanco 3/29 >> 4/7 Zosyn 3/29 >> 3/30 Imipenem 3/31 >> 4/7 Diflucan 4/2 >> 4/6  TUBES / LINES ET 3/16 >> 3/24 Trach 3/24 >> Changed to #4 cuffless 4/8 >> L IJ CVL 3/16 >> 3/27 RUE PICC 3/27 >> 4/8    Discharge Exam: General: chronically ill elderly female in NAD Neuro: AAOx4, speech clear, 5/5 upper extremities, generalized weakness to LE's CV: s1s2 rrr, no m/r/g PULM: resp's even/non-labored, lungs bilaterally clear GI: round/soft, bsx4 active, tolerating modified diet Extremities: warm/dry, no edema.  Resolving R wrist blister (dry, improved)  Filed Vitals:   06/13/14 0402 06/13/14 0503 06/13/14 0922 06/13/14 1156  BP:  127/61    Pulse: 80 83 104 88  Temp:  97.5 F (36.4 C)    TempSrc:  Oral    Resp: '20 17 18 20  ' Height:      Weight:  178 lb 12.7 oz (81.1 kg)    SpO2: 98% 100%  100%     Discharge Labs  BMET  Recent Labs Lab 06/07/14 0430 06/08/14 0415 06/09/14 0400 06/10/14 0530  06/11/14 0459 06/12/14 0440 06/13/14 0550  NA 143 140 136 133* 137 136 131*  K 3.8 3.7 4.2 4.1 3.9 4.5 4.9  CL 106 103 102 98 102 101 98  CO2 '25 28 27 27 28 27 26  ' GLUCOSE 223* 141* 214* 214* 75 81 217*  BUN '23 22 19 19 14 13 15  ' CREATININE 0.61 0.50 0.48* 0.53 0.51 0.57 0.63  CALCIUM 7.8* 8.6 8.6 8.5 8.9 9.1 9.1  MG 1.9 1.9  --   --   --   --   --   PHOS 2.5 3.3  --   --   --   --   --    CBC  Recent Labs Lab 06/11/14 0459 06/12/14  0440 06/13/14 0550  HGB 8.7* 9.1* 9.2*  HCT 28.9* 30.6* 30.7*  WBC 10.5 9.3 8.1  PLT 615* 556* 532*     Discharge Instructions    Call MD for:  difficulty breathing, headache or visual disturbances    Complete by:  As directed      Call MD for:  extreme fatigue    Complete by:  As directed      Call MD for:  hives    Complete by:  As directed      Call MD for:  persistant dizziness or light-headedness    Complete by:  As directed      Call MD for:  persistant nausea and vomiting    Complete by:  As directed      Call MD for:  severe uncontrolled pain    Complete by:  As directed      Call MD for:  temperature >100.4    Complete by:  As directed      Diet - low sodium heart healthy    Complete by:  As directed      Discharge instructions    Complete by:  As directed   1.  Trach care daily with inner cannula change and PRN .  Patient has a #4 Shiley un-cuffed tracheostomy 2.  Follow up with trach clinic as scheduled for possible decannulation.  3.  Continue aggressive PT efforts  4.  Speech therapy to see patient as SNF.  Continue diet progression efforts and PMV usage.     Increase activity slowly    Complete by:  As directed                 Follow-up Information    Follow up with Markus Daft On 07/09/2014.   Why:  Appt at 12:30.  If patient is arriving by wheelchair, she can check in at admitting.  If arriving by stretcher, she can come in through the ER to the Cardiopulmonary Department.   Contact information:    Report to Admitting at [facility] for appointment.   [phone]      Follow up with Philis Fendt, MD.   Specialty:  Internal Medicine   Why:  As needed   Contact information:   [place] [id number] [phone]          Medication List    STOP taking these medications        HYDROcodone-acetaminophen 5-325 MG per tablet  Commonly known as:  NORCO/VICODIN     insulin glargine 100 UNIT/ML injection  Commonly known as:  LANTUS     ramipril 5 MG capsule  Commonly known as:  ALTACE     sitaGLIPtin-metformin 50-500 MG per tablet  Commonly known as:  JANUMET      TAKE these medications        amitriptyline 10 MG tablet  Commonly known as:  ELAVIL  Take 1 tablet (10 mg total) by mouth at bedtime.     aspirin EC 81 MG tablet  Take 81 mg by mouth daily.     feeding supplement (ENSURE ENLIVE) Liqd  Take 237 mLs by mouth 2 (two) times daily between meals.     furosemide 40 MG tablet  Commonly known as:  LASIX  Take 0.5 tablets (20 mg total) by mouth daily.     gabapentin 100 MG capsule  Commonly known as:  NEURONTIN  Take 1 capsule (100 mg total) by mouth 2 (two) times daily.  insulin aspart 100 UNIT/ML injection  Commonly known as:  novoLOG  - CBG < 70: implement hypoglycemia protocol  - CBG 70 - 120: 0 units  - CBG 121 - 150: 2 units  - CBG 151 - 200: 3 units  - CBG 201 - 250: 5 units  - CBG 251 - 300: 8 units  - CBG 301 - 350: 11 units  - CBG 351 - 400: 15 units  - CBG > 400: call MD     levothyroxine 150 MCG tablet  Commonly known as:  SYNTHROID, LEVOTHROID  Take 150 mcg by mouth daily before breakfast.     metoprolol tartrate 25 MG tablet  Commonly known as:  LOPRESSOR  Take 1 tablet (25 mg total) by mouth 2 (two) times daily.     oxyCODONE-acetaminophen 5-325 MG per tablet  Commonly known as:  PERCOCET/ROXICET  Take 1 tablet by mouth every 6 (six) hours as needed for moderate pain.     simvastatin 20 MG tablet   Commonly known as:  ZOCOR  Take 20 mg by mouth every evening.          Disposition:  [facility]   Discharged Condition: ERMAL BRZOZOWSKI has met maximum benefit of inpatient care and is medically stable and cleared for discharge.  Patient is pending follow up as above.      Time spent on disposition:  Greater than 35 minutes.   Signed: Noe Gens, NP-C [place] [facility] Pgr: [phone] Office: [phone]    Baltazar Apo, MD, PhD 06/20/2014, 1:47 PM [place] [facility] [phone] or if no answer [phone]

## 2014-06-13 NOTE — Progress Notes (Signed)
Writer has now given report to [facility]. Family aware of transfer.

## 2014-06-13 NOTE — Progress Notes (Signed)
NUTRITION FOLLOW-UP  INTERVENTION:  Ensure Enlive po BID, each supplement provides 350 kcal and 20 grams of protein  NUTRITION DIAGNOSIS: Inadequate oral intake related to dysphagia as evidenced by limited diet; ongoing  Goal: Pt to meet >/= 90% of their estimated nutrition needs; not currently met  Monitor:  Weight trend, labs/CBGs, diet advancement/SLP recommendations  ASSESSMENT: Admitted 3/15 with 4 day hx of generalized body aches, SOB, subjective fevers, chills.   Intubated 3/16-3/24. (PNA/H1N1) Trach placed 3/24 NGT out 4/5 SLP following and adv diet 4/7 to Dysphagia 2 with Thin Liquids Pt consumed 50% of his dinner 4/7.  Pt want a ham biscuit but is unable to have until diet increased.  NP in to see pt and plan for d/c to rehab is planned 4/8.    Height: Ht Readings from Last 1 Encounters:  05/29/14 '5\' 6"'  (1.676 m)   Weight: Wt Readings from Last 1 Encounters:  06/13/14 178 lb 12.7 oz (81.1 kg)  Admission weight: 160 lb 3/15 Pt is positive 20L  BMI:  Body mass index is 28.87 kg/(m^2).  Estimated Nutritional Needs: Kcal: 1800-2000 Protein: 100-115 g Fluid: 1.8 L/day  Skin: two small skin tears on sacrum  Diet Order: DIET DYS 2 Room service appropriate?: Yes; Fluid consistency:: Thin   Intake/Output Summary (Last 24 hours) at 06/13/14 1010 Last data filed at 06/13/14 0600  Gross per 24 hour  Intake    320 ml  Output    900 ml  Net   -580 ml    Last BM: 4/7 large  Labs:   Recent Labs Lab 06/07/14 0430 06/08/14 0415  06/11/14 0459 06/12/14 0440 06/13/14 0550  NA 143 140  < > 137 136 131*  K 3.8 3.7  < > 3.9 4.5 4.9  CL 106 103  < > 102 101 98  CO2 25 28  < > '28 27 26  ' BUN 23 22  < > '14 13 15  ' CREATININE 0.61 0.50  < > 0.51 0.57 0.63  CALCIUM 7.8* 8.6  < > 8.9 9.1 9.1  MG 1.9 1.9  --   --   --   --   PHOS 2.5 3.3  --   --   --   --   GLUCOSE 223* 141*  < > 75 81 217*  < > = values in this interval not displayed.  CBG (last 3)    Recent Labs  06/12/14 1759 06/12/14 2020 06/13/14 0729  GLUCAP 208* 229* 208*    Scheduled Meds: . amitriptyline  10 mg Oral QHS  . antiseptic oral rinse  7 mL Mouth Rinse QID  . aspirin  81 mg Oral Daily  . bacitracin  1 application Topical BID  . chlorhexidine  15 mL Mouth Rinse BID  . enoxaparin (LOVENOX) injection  40 mg Subcutaneous Q24H  . furosemide  40 mg Oral Daily  . gabapentin  100 mg Oral BID  . insulin aspart  0-15 Units Subcutaneous TID WC  . levothyroxine  150 mcg Per Tube QAC breakfast  . metoprolol tartrate  25 mg Oral BID  . pantoprazole  40 mg Oral Daily  . simvastatin  20 mg Per Tube QPM  . sodium chloride  10-40 mL Intracatheter Q12H    Continuous Infusions:   Maylon Peppers RD, [place], CNSC [phone] Pager [phone] After Hours Pager

## 2014-07-09 ENCOUNTER — Ambulatory Visit (HOSPITAL_COMMUNITY)
Admit: 2014-07-09 | Discharge: 2014-07-09 | Disposition: A | Discharge status: Home / Self Care | Payer: Commercial Managed Care - HMO | Source: Ambulatory Visit | Attending: Acute Care | Admitting: Acute Care

## 2014-07-09 ENCOUNTER — Encounter (INDEPENDENT_AMBULATORY_CARE_PROVIDER_SITE_OTHER): Payer: Self-pay

## 2014-07-09 DIAGNOSIS — J9690 Respiratory failure, unspecified, unspecified whether with hypoxia or hypercapnia: Secondary | ICD-10-CM | POA: Insufficient documentation

## 2014-07-09 DIAGNOSIS — Z794 Long term (current) use of insulin: Secondary | ICD-10-CM | POA: Insufficient documentation

## 2014-07-09 DIAGNOSIS — E039 Hypothyroidism, unspecified: Secondary | ICD-10-CM | POA: Diagnosis not present

## 2014-07-09 DIAGNOSIS — E119 Type 2 diabetes mellitus without complications: Secondary | ICD-10-CM | POA: Diagnosis not present

## 2014-07-09 DIAGNOSIS — E785 Hyperlipidemia, unspecified: Secondary | ICD-10-CM | POA: Insufficient documentation

## 2014-07-09 DIAGNOSIS — I10 Essential (primary) hypertension: Secondary | ICD-10-CM | POA: Insufficient documentation

## 2014-07-09 DIAGNOSIS — Z930 Tracheostomy status: Secondary | ICD-10-CM

## 2014-07-09 NOTE — Progress Notes (Addendum)
CC: trach status/ possible decannulation  Brief History  69 y.o. y/o female with a PMH of hypothyroidism, HLD, HTN, Diabetes (on insulin) with peripheral neuropathy who was [facility] 3/15 [facility] with a 10 day history of generalized body aches, SOB, subjective fevers/chills and decreased PO intake. On initial presentation, she had fever of 103, tachypnea and hypoxia. The patient was admitted to ICU for close observation. She unfortunately deteriorated on 3/16 and required intubation / mechanical ventilation. The patient developed ARDS in the setting of H1N1 and pneumococcal PNA. ICU stay was prolonged due to slow weaning, septic shock and AKI (all resolved). She developed persistent fevers (3/27) and was found to have an E-Coli and MRSA HCAP and was treated with antibiotics as below. The patient has completed antibiotics for PNA. She began to make slow progress with weaning 4/1 and ultimately was weaned to aerosolized trach collar on 4/3 without difficulty. She made significant improvement with PT efforts. The patient was evaluated by Speech Therapy and has tolerated PMV usage well. She has progressed to a D2, thin liquid diet (see rec's as above). Prior to discharge, she was downsized to a #4 cuffless tracheostomy.Since arrival at SNF she has continued to make strides towards rehab. Her biggest issue has been poor Lower extremity strength. She has been tolerating PMV well, her phonation is strong, and can cough w/out issue. She presents today for trach follow up and possibly decannulation.     Past Medical History  Diagnosis Date  . Diabetes mellitus   . Hypothyroidism   . Hypertension   . Hyperlipidemia    No family history on file. No Known Allergies History   Social History  . Marital Status: Married    Spouse Name: N/A  . Number of Children: N/A  . Years of Education: N/A   Occupational History  . Not on file.   Social History Main Topics  . Smoking  status: Former Smoker -- 0.50 packs/day    Quit date: 05/06/2014  . Smokeless tobacco: Never Used  . Alcohol Use: No  . Drug Use: No  . Sexual Activity: Not on file   Other Topics Concern  . Not on file   Social History Narrative    Review of Systems:   Bolds are positive  Constitutional: weight loss, gain, night sweats, Fevers, chills, fatigue . Still weak lowers, but excellent strength w/ uppers. No swallowing issues. Tolerating PMV well.  HEENT: headaches, Sore throat, sneezing, nasal congestion, post nasal drip, Difficulty swallowing, Tooth/dental problems, visual complaints visual changes, ear ache CV:  chest pain, radiates: ,Orthopnea, PND, swelling in lower extremities, dizziness, palpitations, syncope.  GI  heartburn, indigestion, abdominal pain, nausea, vomiting, diarrhea, change in bowel habits, loss of appetite, bloody stools.  Resp: cough, productive: , hemoptysis, dyspnea, chest pain, pleuritic.  Skin: rash or itching or icterus GU: dysuria, change in color of urine, urgency or frequency. flank pain, hematuria  MS: joint pain or swelling. decreased range of motion Still Lower ext weakness.  Psych: change in mood or affect. depression or anxiety.  Neuro: difficulty with speech, weakness, numbness, ataxia   HR 80s, RR 16, sats 100% room air.   General appearance:  69 Year old  AAF sitting up in chair. No distress.  Mouth:  membranes and no mucosal ulcerations; normal hard and soft palate Neck: Trachea midline, # 4 trach, cuffless trach stoma unremarkable. Excellent phonation w/ PMV and also w/ finger occlusion.   Lungs: CTA, with normal respiratory effort  and no intercostal retractions CV: RRR, no MRGs  Abdomen: Soft, non-tender; no masses or HSM Extremities: No peripheral edema or extremity lymphadenopathy Skin: Normal temperature, turgor and texture; no rash, ulcers or subcutaneous nodules Psych: Appropriate affect, alert and oriented to person, place and  time  Impression/plan  Tracheostomy status s/p prolonged critical illness and respiratory failure from H1N1,MRSA PNA and ARDS. -->all resolved. Decannulated her w/out difficulty. Tolerated well here in clinic.   Plan Routine post-decannulation measures as follows: Keep current dressing X 48 hrs Then place regular dressing daily until stoma closed.  No submerging under water.  Have recommended f/u SLP eval   Simonne MartinetPeter E Gillie Fleites ACNP-BC [facility] Pager # [phone] OR # [phone] if no answer

## 2014-07-09 NOTE — Progress Notes (Signed)
Tracheostomy Procedure Note  Jackquline BoschLinda K Chura [id number] Sep 14, 1945  Pre Procedure Tracheostomy Information  Trach Brand: Shiley Size: 4 Style: Uncuffed Secured by: Velcro   Procedure: Decannulation    Post Procedure Evaluation:  Pt decannulated at this time per Anders SimmondsPete Babcock.  Pt tolerating well.  No distress noted at this time.  Pt on RA Spo2 100%.    Education: Explained to pt and pt's sister that dressing needs to be changed first time on Friday then daily until stoma is closed.   Prescription needs: None

## 2015-03-16 ENCOUNTER — Inpatient Hospital Stay (HOSPITAL_COMMUNITY)
Admission: EM | Admit: 2015-03-16 | Discharge: 2015-04-08 | DRG: 208 | Disposition: E | Payer: Commercial Managed Care - HMO | Attending: Internal Medicine | Admitting: Internal Medicine

## 2015-03-16 ENCOUNTER — Inpatient Hospital Stay (HOSPITAL_COMMUNITY): Payer: Commercial Managed Care - HMO

## 2015-03-16 ENCOUNTER — Emergency Department (HOSPITAL_COMMUNITY): Payer: Commercial Managed Care - HMO

## 2015-03-16 ENCOUNTER — Encounter (HOSPITAL_COMMUNITY): Payer: Self-pay | Admitting: Radiology

## 2015-03-16 DIAGNOSIS — D689 Coagulation defect, unspecified: Secondary | ICD-10-CM | POA: Diagnosis present

## 2015-03-16 DIAGNOSIS — R4020 Unspecified coma: Secondary | ICD-10-CM | POA: Diagnosis present

## 2015-03-16 DIAGNOSIS — Z794 Long term (current) use of insulin: Secondary | ICD-10-CM

## 2015-03-16 DIAGNOSIS — G9340 Encephalopathy, unspecified: Secondary | ICD-10-CM | POA: Diagnosis present

## 2015-03-16 DIAGNOSIS — R402432 Glasgow coma scale score 3-8, at arrival to emergency department: Secondary | ICD-10-CM | POA: Diagnosis present

## 2015-03-16 DIAGNOSIS — Z7401 Bed confinement status: Secondary | ICD-10-CM

## 2015-03-16 DIAGNOSIS — K922 Gastrointestinal hemorrhage, unspecified: Secondary | ICD-10-CM | POA: Insufficient documentation

## 2015-03-16 DIAGNOSIS — I10 Essential (primary) hypertension: Secondary | ICD-10-CM | POA: Diagnosis present

## 2015-03-16 DIAGNOSIS — Z7982 Long term (current) use of aspirin: Secondary | ICD-10-CM

## 2015-03-16 DIAGNOSIS — Z66 Do not resuscitate: Secondary | ICD-10-CM | POA: Diagnosis present

## 2015-03-16 DIAGNOSIS — E872 Acidosis: Secondary | ICD-10-CM | POA: Diagnosis present

## 2015-03-16 DIAGNOSIS — Z8673 Personal history of transient ischemic attack (TIA), and cerebral infarction without residual deficits: Secondary | ICD-10-CM

## 2015-03-16 DIAGNOSIS — N179 Acute kidney failure, unspecified: Secondary | ICD-10-CM | POA: Diagnosis present

## 2015-03-16 DIAGNOSIS — E1142 Type 2 diabetes mellitus with diabetic polyneuropathy: Secondary | ICD-10-CM | POA: Diagnosis present

## 2015-03-16 DIAGNOSIS — G9341 Metabolic encephalopathy: Secondary | ICD-10-CM | POA: Diagnosis present

## 2015-03-16 DIAGNOSIS — E039 Hypothyroidism, unspecified: Secondary | ICD-10-CM | POA: Diagnosis present

## 2015-03-16 DIAGNOSIS — J189 Pneumonia, unspecified organism: Principal | ICD-10-CM | POA: Diagnosis present

## 2015-03-16 DIAGNOSIS — G931 Anoxic brain damage, not elsewhere classified: Secondary | ICD-10-CM | POA: Diagnosis present

## 2015-03-16 DIAGNOSIS — Z87891 Personal history of nicotine dependence: Secondary | ICD-10-CM

## 2015-03-16 DIAGNOSIS — E785 Hyperlipidemia, unspecified: Secondary | ICD-10-CM | POA: Diagnosis present

## 2015-03-16 DIAGNOSIS — A419 Sepsis, unspecified organism: Secondary | ICD-10-CM | POA: Diagnosis present

## 2015-03-16 DIAGNOSIS — E875 Hyperkalemia: Secondary | ICD-10-CM | POA: Diagnosis present

## 2015-03-16 DIAGNOSIS — I469 Cardiac arrest, cause unspecified: Secondary | ICD-10-CM | POA: Diagnosis present

## 2015-03-16 DIAGNOSIS — J9690 Respiratory failure, unspecified, unspecified whether with hypoxia or hypercapnia: Secondary | ICD-10-CM | POA: Diagnosis present

## 2015-03-16 DIAGNOSIS — R6521 Severe sepsis with septic shock: Secondary | ICD-10-CM | POA: Diagnosis present

## 2015-03-16 LAB — URINE MICROSCOPIC-ADD ON: Squamous Epithelial / LPF: NONE SEEN

## 2015-03-16 LAB — COMPREHENSIVE METABOLIC PANEL
ALK PHOS: 162 U/L — AB (ref 38–126)
ALT: 291 U/L — ABNORMAL HIGH (ref 14–54)
ALT: <5 U/L — ABNORMAL LOW (ref 14–54)
ANION GAP: 27 — AB (ref 5–15)
AST: 443 U/L — ABNORMAL HIGH (ref 15–41)
AST: 457 U/L — ABNORMAL HIGH (ref 15–41)
Albumin: 2 g/dL — ABNORMAL LOW (ref 3.5–5.0)
Albumin: 2 g/dL — ABNORMAL LOW (ref 3.5–5.0)
Alkaline Phosphatase: 156 U/L — ABNORMAL HIGH (ref 38–126)
Anion gap: 28 — ABNORMAL HIGH (ref 5–15)
BILIRUBIN TOTAL: 1.1 mg/dL (ref 0.3–1.2)
BUN: 40 mg/dL — ABNORMAL HIGH (ref 6–20)
BUN: 39 mg/dL — ABNORMAL HIGH (ref 6–20)
CALCIUM: 8.1 mg/dL — AB (ref 8.9–10.3)
CHLORIDE: 105 mmol/L (ref 101–111)
CO2: 8 mmol/L — ABNORMAL LOW (ref 22–32)
CO2: 7 mmol/L — AB (ref 22–32)
Calcium: 7.8 mg/dL — ABNORMAL LOW (ref 8.9–10.3)
Chloride: 106 mmol/L (ref 101–111)
Creatinine, Ser: 2.21 mg/dL — ABNORMAL HIGH (ref 0.44–1.00)
Creatinine, Ser: 2.21 mg/dL — ABNORMAL HIGH (ref 0.44–1.00)
GFR calc non Af Amer: 22 mL/min — ABNORMAL LOW (ref >60)
GFR, EST AFRICAN AMERICAN: 25 mL/min — AB (ref >60)
GFR, EST AFRICAN AMERICAN: 25 mL/min — AB (ref >60)
GFR, EST NON AFRICAN AMERICAN: 22 mL/min — AB (ref >60)
Glucose, Bld: 305 mg/dL — ABNORMAL HIGH (ref 65–99)
Glucose, Bld: 296 mg/dL — ABNORMAL HIGH (ref 65–99)
Potassium: 4.1 mmol/L (ref 3.5–5.1)
Potassium: 4.1 mmol/L (ref 3.5–5.1)
SODIUM: 140 mmol/L (ref 135–145)
Sodium: 141 mmol/L (ref 135–145)
TOTAL PROTEIN: 4.8 g/dL — AB (ref 6.5–8.1)
Total Bilirubin: 1.3 mg/dL — ABNORMAL HIGH (ref 0.3–1.2)
Total Protein: 5 g/dL — ABNORMAL LOW (ref 6.5–8.1)

## 2015-03-16 LAB — I-STAT CHEM 8, ED
BUN: 47 mg/dL — AB (ref 6–20)
CALCIUM ION: 1.05 mmol/L — AB (ref 1.13–1.30)
CHLORIDE: 108 mmol/L (ref 101–111)
CREATININE: 1.5 mg/dL — AB (ref 0.44–1.00)
Glucose, Bld: 289 mg/dL — ABNORMAL HIGH (ref 65–99)
HCT: 27 % — ABNORMAL LOW (ref 36.0–46.0)
Hemoglobin: 9.2 g/dL — ABNORMAL LOW (ref 12.0–15.0)
Potassium: 3.9 mmol/L (ref 3.5–5.1)
Sodium: 140 mmol/L (ref 135–145)
TCO2: 8 mmol/L (ref 0–100)

## 2015-03-16 LAB — CBC
HCT: 28.5 % — ABNORMAL LOW (ref 36.0–46.0)
Hemoglobin: 8.1 g/dL — ABNORMAL LOW (ref 12.0–15.0)
MCH: 25.9 pg — AB (ref 26.0–34.0)
MCHC: 28.4 g/dL — AB (ref 30.0–36.0)
MCV: 91.1 fL (ref 78.0–100.0)
PLATELETS: 201 10*3/uL (ref 150–400)
RBC: 3.13 MIL/uL — AB (ref 3.87–5.11)
RDW: 14.2 % (ref 11.5–15.5)
WBC: 30.2 10*3/uL — ABNORMAL HIGH (ref 4.0–10.5)

## 2015-03-16 LAB — INFLUENZA PANEL BY PCR (TYPE A & B)
H1N1 flu by pcr: NOT DETECTED
Influenza A By PCR: NEGATIVE
Influenza B By PCR: NEGATIVE

## 2015-03-16 LAB — TSH: TSH: 2.927 u[IU]/mL (ref 0.350–4.500)

## 2015-03-16 LAB — CBG MONITORING, ED
GLUCOSE-CAPILLARY: 259 mg/dL — AB (ref 65–99)
GLUCOSE-CAPILLARY: 315 mg/dL — AB (ref 65–99)

## 2015-03-16 LAB — URINALYSIS, ROUTINE W REFLEX MICROSCOPIC
Bilirubin Urine: NEGATIVE
GLUCOSE, UA: 250 mg/dL — AB
KETONES UR: 40 mg/dL — AB
Nitrite: NEGATIVE
Protein, ur: 100 mg/dL — AB
Specific Gravity, Urine: 1.017 (ref 1.005–1.030)
pH: 5.5 (ref 5.0–8.0)

## 2015-03-16 LAB — I-STAT TROPONIN, ED: Troponin i, poc: 0.07 ng/mL (ref 0.00–0.08)

## 2015-03-16 LAB — APTT: APTT: 68 s — AB (ref 24–37)

## 2015-03-16 LAB — POC OCCULT BLOOD, ED: FECAL OCCULT BLD: NEGATIVE

## 2015-03-16 LAB — TYPE AND SCREEN
ABO/RH(D): O POS
Antibody Screen: NEGATIVE

## 2015-03-16 LAB — MRSA PCR SCREENING: MRSA by PCR: NEGATIVE

## 2015-03-16 LAB — GLUCOSE, CAPILLARY: Glucose-Capillary: 350 mg/dL — ABNORMAL HIGH (ref 65–99)

## 2015-03-16 LAB — LACTIC ACID, PLASMA
LACTIC ACID, VENOUS: 10.5 mmol/L — AB (ref 0.5–2.0)
LACTIC ACID, VENOUS: 10.1 mmol/L — AB (ref 0.5–2.0)

## 2015-03-16 LAB — AMYLASE: Amylase: 26 U/L — ABNORMAL LOW (ref 28–100)

## 2015-03-16 LAB — TROPONIN I: TROPONIN I: 0.61 ng/mL — AB (ref <0.031)

## 2015-03-16 LAB — CORTISOL: Cortisol, Plasma: 36.4 ug/dL

## 2015-03-16 LAB — PROTIME-INR
INR: 2.78 — AB (ref 0.00–1.49)
PROTHROMBIN TIME: 28.9 s — AB (ref 11.6–15.2)

## 2015-03-16 LAB — LIPASE, BLOOD: LIPASE: 18 U/L (ref 11–51)

## 2015-03-16 MED ORDER — SODIUM BICARBONATE 8.4 % IV SOLN
INTRAVENOUS | Status: DC | PRN
  Administered 2015-03-16: 50 meq via INTRAVENOUS

## 2015-03-16 MED ORDER — LEVOFLOXACIN IN D5W 750 MG/150ML IV SOLN: 750.0000 mg | INTRAVENOUS | Status: DC

## 2015-03-16 MED ORDER — SODIUM CHLORIDE 0.9 % IV SOLN
80.0000 mg | Freq: Once | INTRAVENOUS | Status: AC
  Administered 2015-03-16: 80 mg via INTRAVENOUS
  Filled 2015-03-16: qty 80

## 2015-03-16 MED ORDER — SODIUM CHLORIDE 0.9 % IV BOLUS (SEPSIS)
30.0000 mL/kg | Freq: Once | INTRAVENOUS | Status: AC
  Administered 2015-03-16: 2460 mL via INTRAVENOUS

## 2015-03-16 MED ORDER — CHLORHEXIDINE GLUCONATE 0.12% ORAL RINSE (MEDLINE KIT)
15.0000 mL | Freq: Two times a day (BID) | OROMUCOSAL | Status: DC
  Administered 2015-03-16: 15 mL via OROMUCOSAL

## 2015-03-16 MED ORDER — MIDAZOLAM HCL 2 MG/2ML IJ SOLN: 1.0000 mg | INTRAMUSCULAR | Status: DC | PRN

## 2015-03-16 MED ORDER — DEXTROSE 5 % IV SOLN
0.0000 ug/min | INTRAVENOUS | Status: DC
  Administered 2015-03-16: 10 ug/min via INTRAVENOUS
  Filled 2015-03-16 (×2): qty 4

## 2015-03-16 MED ORDER — DEXTROSE 5 % IV SOLN
1.0000 g | Freq: Two times a day (BID) | INTRAVENOUS | Status: DC
  Administered 2015-03-17: 1 g via INTRAVENOUS
  Filled 2015-03-16 (×3): qty 1

## 2015-03-16 MED ORDER — DEXTROSE 5 % IV SOLN
0.0000 ug/min | Freq: Once | INTRAVENOUS | Status: AC
  Administered 2015-03-16: 2 ug/min via INTRAVENOUS
  Filled 2015-03-16: qty 4

## 2015-03-16 MED ORDER — FENTANYL CITRATE (PF) 100 MCG/2ML IJ SOLN: 50.0000 ug | INTRAMUSCULAR | Status: DC | PRN

## 2015-03-16 MED ORDER — VANCOMYCIN HCL IN DEXTROSE 1-5 GM/200ML-% IV SOLN
1000.0000 mg | INTRAVENOUS | Status: DC
  Filled 2015-03-16: qty 200

## 2015-03-16 MED ORDER — PANTOPRAZOLE SODIUM 40 MG IV SOLR: 40.0000 mg | Freq: Two times a day (BID) | INTRAVENOUS | Status: DC

## 2015-03-16 MED ORDER — SIMVASTATIN 20 MG PO TABS: 20.0000 mg | ORAL_TABLET | Freq: Every day | ORAL | Status: DC

## 2015-03-16 MED ORDER — FENTANYL CITRATE (PF) 100 MCG/2ML IJ SOLN
50.0000 ug | INTRAMUSCULAR | Status: DC | PRN
Start: 2015-03-16 — End: 2015-03-17

## 2015-03-16 MED ORDER — SODIUM CHLORIDE 0.9 % IV SOLN: 250.0000 mL | INTRAVENOUS | Status: DC | PRN

## 2015-03-16 MED ORDER — LEVOTHYROXINE SODIUM 100 MCG IV SOLR
75.0000 ug | Freq: Every day | INTRAVENOUS | Status: DC
  Filled 2015-03-16: qty 5

## 2015-03-16 MED ORDER — FAMOTIDINE IN NACL 20-0.9 MG/50ML-% IV SOLN
20.0000 mg | Freq: Two times a day (BID) | INTRAVENOUS | Status: DC
  Administered 2015-03-16: 20 mg via INTRAVENOUS
  Filled 2015-03-16 (×2): qty 50

## 2015-03-16 MED ORDER — PIPERACILLIN-TAZOBACTAM 3.375 G IVPB 30 MIN
3.3750 g | Freq: Once | INTRAVENOUS | Status: AC
  Administered 2015-03-16: 3.375 g via INTRAVENOUS
  Filled 2015-03-16: qty 50

## 2015-03-16 MED ORDER — SODIUM CHLORIDE 0.9 % IV SOLN
1.0000 g | Freq: Once | INTRAVENOUS | Status: AC
  Administered 2015-03-16: 1 g via INTRAVENOUS
  Filled 2015-03-16: qty 10

## 2015-03-16 MED ORDER — FAMOTIDINE IN NACL 20-0.9 MG/50ML-% IV SOLN
20.0000 mg | Freq: Once | INTRAVENOUS | Status: AC
  Administered 2015-03-16: 20 mg via INTRAVENOUS
  Filled 2015-03-16: qty 50

## 2015-03-16 MED ORDER — ALBUTEROL SULFATE (2.5 MG/3ML) 0.083% IN NEBU: 2.5000 mg | INHALATION_SOLUTION | RESPIRATORY_TRACT | Status: DC | PRN

## 2015-03-16 MED ORDER — SODIUM CHLORIDE 0.9 % IV SOLN
8.0000 mg/h | INTRAVENOUS | Status: DC
  Administered 2015-03-16 – 2015-03-17 (×2): 8 mg/h via INTRAVENOUS
  Filled 2015-03-16 (×5): qty 80

## 2015-03-16 MED ORDER — VANCOMYCIN HCL IN DEXTROSE 1-5 GM/200ML-% IV SOLN
1000.0000 mg | Freq: Once | INTRAVENOUS | Status: AC
  Administered 2015-03-16: 1000 mg via INTRAVENOUS
  Filled 2015-03-16: qty 200

## 2015-03-16 MED ORDER — ASPIRIN 81 MG PO CHEW: 81.0000 mg | CHEWABLE_TABLET | Freq: Every day | ORAL | Status: DC

## 2015-03-16 MED ORDER — SODIUM CHLORIDE 0.9 % IV SOLN
INTRAVENOUS | Status: DC | PRN
  Administered 2015-03-16: 1000 mL via INTRAVENOUS

## 2015-03-16 MED ORDER — ANTISEPTIC ORAL RINSE SOLUTION (CORINZ)
7.0000 mL | Freq: Four times a day (QID) | OROMUCOSAL | Status: DC
  Administered 2015-03-16 – 2015-03-17 (×2): 7 mL via OROMUCOSAL

## 2015-03-16 MED ORDER — SODIUM CHLORIDE 0.9 % IV SOLN
INTRAVENOUS | Status: DC
  Administered 2015-03-16 – 2015-03-17 (×2): via INTRAVENOUS

## 2015-03-16 MED ORDER — INSULIN ASPART 100 UNIT/ML ~~LOC~~ SOLN
0.0000 [IU] | SUBCUTANEOUS | Status: DC
  Administered 2015-03-16 – 2015-03-17 (×3): 7 [IU] via SUBCUTANEOUS

## 2015-03-16 NOTE — Code Documentation (Signed)
Pt noted to have large amounts of coffee ground emesis with intubation

## 2015-03-16 NOTE — ED Provider Notes (Signed)
CSN: [id number]     Arrival date & time 03/11/2015  1334 History   First MD Initiated Contact with Patient 03/22/2015 1357     Chief Complaint  Patient presents with  . Cardiac Arrest   70 yo AAF w/PMH CVA (now bedbound), hypothyroidism, HLD, HTN, Diabetes (on insulin), peripheral neuropathy, and ARDS + septic shock from MRSA PNA w/intubation last May who presents today after cardiac arrest. Family found pt unresponsive today and initially thought she was sleeping and then realized she wasn't responding and thought she had a heart attack and called EMS. They were able to get her out of her hospital-grade bed and onto the floor and initiated CPR prior to EMS arriving. They noted dark brown fluid coming out of her mouth. EMS found her to be in PEA arrest and coded her for about 15 minutes w/5 rounds of epi, 4mg  narcan, 1L NS, before obtaining ROSC and starting her on an epi gtt. They also placed a King airway in route and noted 250 cc of coffee ground emesis removed. Placed an IO in left tibia for IV access.  All other history unknown at this point.  (Consider location/radiation/quality/duration/timing/severity/associated sxs/prior Treatment) Patient is a 70 y.o. female presenting with general illness.  Illness Location:  Unresponsive today, noted by family Severity:  Moderate Onset quality:  Sudden Timing:  Constant Chronicity:  New Associated symptoms: vomiting   Associated symptoms: no abdominal pain, no chest pain, no fever, no nausea and no shortness of breath     Past Medical History  Diagnosis Date  . Diabetes mellitus   . Hypothyroidism   . Hypertension   . Hyperlipidemia    Past Surgical History  Procedure Laterality Date  . Back surgery     No family history on file. Social History  Substance Use Topics  . Smoking status: Former Smoker -- 0.50 packs/day    Quit date: 05/06/2014  . Smokeless tobacco: Never Used  . Alcohol Use: No   OB History    No data available      Review of Systems  Unable to perform ROS: Patient unresponsive  Constitutional: Negative for fever.  Respiratory: Negative for shortness of breath.   Cardiovascular: Negative for chest pain.  Gastrointestinal: Positive for vomiting. Negative for nausea and abdominal pain.      Allergies  Review of patient's allergies indicates no known allergies.  Home Medications   Prior to Admission medications   Medication Sig Start Date End Date Taking? Authorizing Provider  amitriptyline (ELAVIL) 10 MG tablet Take 1 tablet (10 mg total) by mouth at bedtime. 06/13/14   Jeanella Craze, NP  aspirin EC 81 MG tablet Take 81 mg by mouth daily.    Historical Provider, MD  feeding supplement, ENSURE ENLIVE, (ENSURE ENLIVE) LIQD Take 237 mLs by mouth 2 (two) times daily between meals. 06/13/14   Jeanella Craze, NP  furosemide (LASIX) 40 MG tablet Take 0.5 tablets (20 mg total) by mouth daily. 06/13/14   Jeanella Craze, NP  gabapentin (NEURONTIN) 100 MG capsule Take 1 capsule (100 mg total) by mouth 2 (two) times daily. 06/13/14   Jeanella Craze, NP  insulin aspart (NOVOLOG) 100 UNIT/ML injection CBG < 70: implement hypoglycemia protocol CBG 70 - 120: 0 units CBG 121 - 150: 2 units CBG 151 - 200: 3 units CBG 201 - 250: 5 units CBG 251 - 300: 8 units CBG 301 - 350: 11 units CBG 351 - 400: 15 units CBG > 400:  call MD 06/13/14   Jeanella CrazeBrandi L Ollis, NP  levothyroxine (SYNTHROID, LEVOTHROID) 150 MCG tablet Take 150 mcg by mouth daily before breakfast.    Historical Provider, MD  metoprolol tartrate (LOPRESSOR) 25 MG tablet Take 1 tablet (25 mg total) by mouth 2 (two) times daily. 06/13/14   Jeanella CrazeBrandi L Ollis, NP  oxyCODONE-acetaminophen (PERCOCET/ROXICET) 5-325 MG per tablet Take 1 tablet by mouth every 6 (six) hours as needed for moderate pain. 06/13/14   Jeanella CrazeBrandi L Ollis, NP  simvastatin (ZOCOR) 20 MG tablet Take 20 mg by mouth every evening.    Historical Provider, MD   BP 78/44 mmHg  Pulse 105  Temp(Src) 95.2 F (35.1  C) (Rectal)  Resp 30  SpO2 100% Physical Exam  Constitutional: She appears well-developed and well-nourished. No distress.  HENT:  Head: Normocephalic and atraumatic.  Right pupil 3mm, left 5mm Coffee ground emesis in airway, on face, and neck.  Cardiovascular: Normal rate, regular rhythm, normal heart sounds and intact distal pulses.  Exam reveals no gallop and no friction rub.   No murmur heard. Pulmonary/Chest: Effort normal and breath sounds normal. No respiratory distress. She has no wheezes. She has no rales. She exhibits no tenderness.  Abdominal: Soft. Bowel sounds are normal. She exhibits no distension and no mass. There is no tenderness. There is no rebound and no guarding.  Lymphadenopathy:    She has no cervical adenopathy.  Neurological:  GCS 3  Skin: Skin is warm and dry. No rash noted. She is not diaphoretic.  Nursing note and vitals reviewed.   ED Course  .Critical Care Performed by: Rachelle HoraSMITH, Vesper Trant Authorized by: Rachelle HoraSMITH, Revia Nghiem Total critical care time: 45 minutes Critical care was necessary to treat or prevent imminent or life-threatening deterioration of the following conditions: respiratory failure, cardiac failure, sepsis and shock. Critical care was time spent personally by me on the following activities: vascular access procedures, re-evaluation of patient's condition, ordering and performing treatments and interventions, discussions with consultants, evaluation of patient's response to treatment, ordering and review of laboratory studies, review of old charts, ordering and review of radiographic studies and examination of patient. Subsequent provider of critical care: I assumed direction of critical care for this patient from another provider of my specialty.  Marthenia Rolling.Central Line Date/Time: 03/22/2015 3:02 PM Performed by: Rachelle HoraSMITH, Ahliyah Nienow Authorized by: Rachelle HoraSMITH, Norvell Caswell Consent: The procedure was performed in an emergent situation. Patient identity confirmed: arm band Time out:  Immediately prior to procedure a "time out" was called to verify the correct patient, procedure, equipment, support staff and site/side marked as required. Indications: vascular access Preparation: skin prepped with 2% chlorhexidine Skin prep agent dried: skin prep agent completely dried prior to procedure Sterile barriers: all five maximum sterile barriers used - cap, mask, sterile gown, sterile gloves, and large sterile sheet Hand hygiene: hand hygiene performed prior to central venous catheter insertion Location details: right internal jugular Patient position: Trendelenburg Catheter size: 7.5 Fr Ultrasound guidance: yes Sterile ultrasound techniques: sterile gel and sterile probe covers were used Number of attempts: 1 Successful placement: yes Post-procedure: line sutured and dressing applied Assessment: blood return through all ports,  free fluid flow,  placement verified by x-ray and no pneumothorax on x-ray Patient tolerance: Patient tolerated the procedure well with no immediate complications  .Intubation Date/Time: 03/28/2015 3:02 PM Performed by: Rachelle HoraSMITH, Mavrik Bynum Authorized by: Rachelle HoraSMITH, Conroy Goracke Consent: The procedure was performed in an emergent situation. Time out: Immediately prior to procedure a "time out" was called to verify the correct patient,  procedure, equipment, support staff and site/side marked as required. Indications: airway protection and  respiratory failure Intubation method: direct Patient status: unconscious Preoxygenation: BVM Laryngoscope size: Mac 3 Tube size: 7.5 mm Tube type: cuffed Number of attempts: 2 Ventilation between attempts: BVM Cricoid pressure: yes Cords visualized: yes Post-procedure assessment: chest rise and CO2 detector Breath sounds: equal and absent over the epigastrium Cuff inflated: yes ETT to lip: 22 cm Tube secured with: ETT holder Chest x-ray interpreted by me. Chest x-ray findings: endotracheal tube in appropriate position Patient  tolerance: Patient tolerated the procedure well with no immediate complications   (including critical care time) Labs Review Labs Reviewed  CBC - Abnormal; Notable for the following:    WBC 30.2 (*)    RBC 3.13 (*)    Hemoglobin 8.1 (*)    HCT 28.5 (*)    MCH 25.9 (*)    MCHC 28.4 (*)    All other components within normal limits  COMPREHENSIVE METABOLIC PANEL - Abnormal; Notable for the following:    CO2 8 (*)    Glucose, Bld 305 (*)    BUN 40 (*)    Creatinine, Ser 2.21 (*)    Calcium 7.8 (*)    Total Protein 5.0 (*)    Albumin 2.0 (*)    AST 443 (*)    ALT 291 (*)    Alkaline Phosphatase 156 (*)    Total Bilirubin 1.3 (*)    GFR calc non Af Amer 22 (*)    GFR calc Af Amer 25 (*)    Anion gap 28 (*)    All other components within normal limits  PROTIME-INR - Abnormal; Notable for the following:    Prothrombin Time 28.9 (*)    INR 2.78 (*)    All other components within normal limits  APTT - Abnormal; Notable for the following:    aPTT 68 (*)    All other components within normal limits  I-STAT CHEM 8, ED - Abnormal; Notable for the following:    BUN 47 (*)    Creatinine, Ser 1.50 (*)    Glucose, Bld 289 (*)    Calcium, Ion 1.05 (*)    Hemoglobin 9.2 (*)    HCT 27.0 (*)    All other components within normal limits  CBG MONITORING, ED - Abnormal; Notable for the following:    Glucose-Capillary 259 (*)    All other components within normal limits  URINE CULTURE  CULTURE, BLOOD (ROUTINE X 2)  CULTURE, BLOOD (ROUTINE X 2)  URINALYSIS, ROUTINE W REFLEX MICROSCOPIC (NOT AT [facility])  I-STAT TROPOININ, ED  POC OCCULT BLOOD, ED  TYPE AND SCREEN    Imaging Review Dg Chest Portable 1 View  04/07/2015  CLINICAL DATA:  Central line placement. EXAM: PORTABLE CHEST 1 VIEW COMPARISON:  Earlier the same date and 06/11/2014. FINDINGS: 1432 hours. The endotracheal tube tip is 2.2 cm above the carina. Nasogastric tube projects below the diaphragm. There is a new right IJ central  venous catheter projecting to the SVC right atrial level. The heart size and mediastinal contours are stable. There are stable asymmetric right perihilar airspace opacities, including a focal upper lobe component which could reflect an underlying mass. There is no pleural effusion or pneumothorax. IMPRESSION: 1. Interval central line placement as described.  No pneumothorax. 2. Stable asymmetric airspace disease on the right with potential right suprahilar mass. This will require continued follow-up to exclude neoplasm. Electronically Signed   By: Hilarie Fredrickson.D.  On: 2015-03-20 14:47   Dg Chest Portable 1 View  20-Mar-2015  CLINICAL DATA:  Intubation. EXAM: PORTABLE CHEST 1 VIEW COMPARISON:  06/11/2014.  06/05/2014. FINDINGS: Interim removal of tracheostomy tube and PICC line. Endotracheal tube and NG tube noted in good anatomic position. Multifocal bilateral pulmonary infiltrates are are again noted. Prominent rounded infiltrate versus mass lesion in the right upper lobe. Close follow-up chest x-rays recommended demonstrate clearing. Heart size stable. No pleural effusion or pneumothorax . IMPRESSION: 1. Endotracheal tube and NG tube noted in good anatomic position. Previously identified tracheostomy tube and PICC line have been removed. 2. Multifocal bilateral pulmonary infiltrates are again noted. Rounded pulmonary infiltrate in the right upper lobe is particularly prominent. Close follow-up chest x-rays suggested to demonstrate clearing to exclude underlying mass . Electronically Signed   By: Maisie Fus  Register   On: 03/20/15 14:07   I have personally reviewed and evaluated these images and lab results as part of my medical decision-making.   EKG Interpretation   Date/Time:  Monday 2015-03-20 13:44:31 EST Ventricular Rate:  110 PR Interval:  147 QRS Duration: 84 QT Interval:  321 QTC Calculation: 434 R Axis:   45 Text Interpretation:  Sinus tachycardia Probable left atrial enlargement   Low voltage, precordial leads Abnormal R-wave progression, early  transition Borderline repol abnormality, diffuse leads Abnormal ekg Since  last tracing diffuse ST depression seen Confirmed by MILLER  MD, BRIAN  [phone]) on March 20, 2015 2:27:57 PM      MDM   Final diagnoses:  Cardiac arrest (HCC)  Sepsis, due to unspecified organism (HCC)  Upper GI bleed  Acute renal failure, unspecified acute renal failure type (HCC)   70 yo AAF w/PEA arrest. See HPI for details. Pt has regained pulses in route w/EMS. Has coffee ground emesis about her face/head. Suspect underlying GI bleed. IV protonix given.  King airway exchanged for ETT: see procedure note above-1 amp bicarb given prior to intubation in case of acidosis.  BPs still tenuous, started on fluids (30cc/kg) and levophed gtt.  Right IJ central line placed as above.  Hb stable at 8.1. WBC elevated at 30. Started on broad spec Abx.  Coags elevated, but not on any blood thinners/AC. C/w multi-organ system failure. Likely sepsis from PNA w/shock. Had about 20 minutes of down time prior to CPR started, then 15 minutes CPR. Poor prognosis.   Admit to ICU for cont'd tx.   Pt was seen under the supervision of Dr. Hyacinth Meeker.     Rachelle Hora, MD 03/20/15 1526  Eber Hong, MD 03/21/2015 1149  Eber Hong, MD 03/14/2015 1150

## 2015-03-16 NOTE — H&P (Signed)
PULMONARY / CRITICAL CARE MEDICINE   Name: Grace Mitchell MRN: [id number] DOB: 02-17-1946    ADMISSION DATE:  04/05/2015 CONSULTATION DATE:  03/23/2015  REFERRING MD:  EDP  CHIEF COMPLAINT:  PEA Arrest  HISTORY OF PRESENT ILLNESS:  Pt is encephelopathic; therefore, this HPI is obtained from chart review. Grace Mitchell is a 70 y.o. female with PMH as outlined below including CVA some 4 - 5 years ago as well as admission last March for septic shock + ARDS due to H1N1 and pneumococcal PNA where she required trach.  She was ultimately decannulated a few months ago.  She had been doing well up until 4 - 5 days ago when she began to have flu like symptoms.  Symptoms progressed and worsened.  On 01/09, her daughter called her and spoke with her, then roughly 15 minutes later, grandson found her unresponsive in her bed.  Husband called 42 while grandson began CPR.  CPR performed for roughly 15 - 18 minutes before EMS arrived. Upon EMS arrival, she was found to be in PEA arrest.  She received another 15 - 20 minutes of CPR including epi x 6.  Total time of ACLS prior to ROSC was 30 - 40 minutes.  In ED, she remained unresponsive and was intubated.  It was noted that she was covered in what appeared to be coffee ground emesis.  We met with family including her husband, two daughters, multiple grandchildren and had extensive discussions with them. We discussed Mrs. Schussler current circumstances and organ failures. We also discussed patient's prior wishes under circumstances such as this. The family has decided not to perform resuscitation if arrest were to occur, but to otherwise continue with current medical support / therapies.   PAST MEDICAL HISTORY :  She  has a past medical history of Diabetes mellitus; Hypothyroidism; Hypertension; and Hyperlipidemia.  PAST SURGICAL HISTORY: She  has past surgical history that includes Back surgery.  No Known Allergies  No current facility-administered medications  on file prior to encounter.   Current Outpatient Prescriptions on File Prior to Encounter  Medication Sig  . amitriptyline (ELAVIL) 10 MG tablet Take 1 tablet (10 mg total) by mouth at bedtime.  Marland Kitchen aspirin EC 81 MG tablet Take 81 mg by mouth daily.  . feeding supplement, ENSURE ENLIVE, (ENSURE ENLIVE) LIQD Take 237 mLs by mouth 2 (two) times daily between meals.  . furosemide (LASIX) 40 MG tablet Take 0.5 tablets (20 mg total) by mouth daily.  Marland Kitchen gabapentin (NEURONTIN) 100 MG capsule Take 1 capsule (100 mg total) by mouth 2 (two) times daily.  . insulin aspart (NOVOLOG) 100 UNIT/ML injection CBG < 70: implement hypoglycemia protocol CBG 70 - 120: 0 units CBG 121 - 150: 2 units CBG 151 - 200: 3 units CBG 201 - 250: 5 units CBG 251 - 300: 8 units CBG 301 - 350: 11 units CBG 351 - 400: 15 units CBG > 400: call MD  . levothyroxine (SYNTHROID, LEVOTHROID) 150 MCG tablet Take 150 mcg by mouth daily before breakfast.  . metoprolol tartrate (LOPRESSOR) 25 MG tablet Take 1 tablet (25 mg total) by mouth 2 (two) times daily.  Marland Kitchen oxyCODONE-acetaminophen (PERCOCET/ROXICET) 5-325 MG per tablet Take 1 tablet by mouth every 6 (six) hours as needed for moderate pain.  . simvastatin (ZOCOR) 20 MG tablet Take 20 mg by mouth every evening.    FAMILY HISTORY:  Her has no family status information on file.   SOCIAL HISTORY: She  reports  that she quit smoking about 10 months ago. She has never used smokeless tobacco. She reports that she does not drink alcohol or use illicit drugs.  REVIEW OF SYSTEMS:   Unable to obtain as pt is encephalopathic.  SUBJECTIVE:  On full vent support, levophed being started.  VITAL SIGNS: BP 78/44 mmHg  Pulse 105  Temp(Src) 95.2 F (35.1 C) (Rectal)  Resp 30  Ht 5' 6.14" (1.68 m)  Wt 81 kg (178 lb 9.2 oz)  BMI 28.70 kg/m2  SpO2 100%  HEMODYNAMICS:    VENTILATOR SETTINGS: Vent Mode:  [-] PRVC FiO2 (%):  [80 %] 80 % Set Rate:  [16 bmp] 16 bmp Vt Set:  [500 mL]  500 mL PEEP:  [5 cmH20] 5 cmH20  INTAKE / OUTPUT:     PHYSICAL EXAMINATION: General: Adult AA female, unresponsive. Neuro: Remains unresponsive with GCS 3. HEENT: [place]/AT. PERRL, sclerae anicteric. Cardiovascular: RRR, no M/R/G.  Lungs: Respirations even and unlabored.  Coarse bilaterally. Abdomen: Distended, BS x 4, soft, NT.  Musculoskeletal: No gross deformities, no edema.  Skin: Intact, warm, no rashes.  LABS:  BMET  Recent Labs Lab 04/06/2015 1341 03/28/2015 1356  NA 141 140  K 4.1 3.9  CL 105 108  CO2 8*  --   BUN 40* 47*  CREATININE 2.21* 1.50*  GLUCOSE 305* 289*    Electrolytes  Recent Labs Lab 03/28/2015 1341  CALCIUM 7.8*    CBC  Recent Labs Lab 04/06/2015 1341 04/01/2015 1356  WBC 30.2*  --   HGB 8.1* 9.2*  HCT 28.5* 27.0*  PLT 201  --     Coag's  Recent Labs Lab 03/22/2015 1341  APTT 68*  INR 2.78*    Sepsis Markers No results for input(s): LATICACIDVEN, PROCALCITON, O2SATVEN in the last 168 hours.  ABG No results for input(s): PHART, PCO2ART, PO2ART in the last 168 hours.  Liver Enzymes  Recent Labs Lab 03/23/2015 1341  AST 443*  ALT 291*  ALKPHOS 156*  BILITOT 1.3*  ALBUMIN 2.0*    Cardiac Enzymes No results for input(s): TROPONINI, PROBNP in the last 168 hours.  Glucose  Recent Labs Lab 04/01/2015 1339  GLUCAP 259*    Imaging Dg Chest Portable 1 View  04/01/2015  CLINICAL DATA:  Central line placement. EXAM: PORTABLE CHEST 1 VIEW COMPARISON:  Earlier the same date and 06/11/2014. FINDINGS: 1432 hours. The endotracheal tube tip is 2.2 cm above the carina. Nasogastric tube projects below the diaphragm. There is a new right IJ central venous catheter projecting to the SVC right atrial level. The heart size and mediastinal contours are stable. There are stable asymmetric right perihilar airspace opacities, including a focal upper lobe component which could reflect an underlying mass. There is no pleural effusion or pneumothorax.  IMPRESSION: 1. Interval central line placement as described.  No pneumothorax. 2. Stable asymmetric airspace disease on the right with potential right suprahilar mass. This will require continued follow-up to exclude neoplasm. Electronically Signed   By: Richardean Sale M.D.   On: 03/18/2015 14:47   Dg Chest Portable 1 View  03/20/2015  CLINICAL DATA:  Intubation. EXAM: PORTABLE CHEST 1 VIEW COMPARISON:  06/11/2014.  06/05/2014. FINDINGS: Interim removal of tracheostomy tube and PICC line. Endotracheal tube and NG tube noted in good anatomic position. Multifocal bilateral pulmonary infiltrates are are again noted. Prominent rounded infiltrate versus mass lesion in the right upper lobe. Close follow-up chest x-rays recommended demonstrate clearing. Heart size stable. No pleural effusion or pneumothorax . IMPRESSION:  1. Endotracheal tube and NG tube noted in good anatomic position. Previously identified tracheostomy tube and PICC line have been removed. 2. Multifocal bilateral pulmonary infiltrates are again noted. Rounded pulmonary infiltrate in the right upper lobe is particularly prominent. Close follow-up chest x-rays suggested to demonstrate clearing to exclude underlying mass . Electronically Signed   By: Marcello Moores  Register   On: 03/08/2015 14:07     STUDIES:  CXR 01/09 > multifocal bilateral infiltrates. Questionable right suprahilar mass. CT head 01/09 >  CULTURES: Blood 01/09 > Urine 01/09 > Sputum 01/09 > Flu PCR 01/09 >  ANTIBIOTICS: Vanc 01/09 > Levaquin 01/09 > Ceftaz 01/09 >  SIGNIFICANT EVENTS: 01/09 > admitted after prolonged PEA arrest.  LINES/TUBES: ETT 01/09 > R IJ CVL 01/09 >   ASSESSMENT / PLAN:  PULMONARY A: VDRF following prolonged PEA arrest 01/09. Concern for influenza / severe CAP. P:   Full vent support. Wean as able. VAP prevention measures. SBT once neuro status improves. Abx as above. Albuterol PRN. CXR in AM.  CARDIOVASCULAR A:  S/p prolonged  PEA arrest 01/09 - roughly 30 - 40 minutes ACLS prior to ROSC. Hx HTN, HLD. DNR Status. P:  Levophed as needed to maintain goal MAP > 65. Monitor hemodynamics. Trend troponins, lactate. Assess cortisol. Continue outpatient ASA, simvastatin. Hold outpatient furosemide, lopressor.  RENAL A:   AKI. Hypocalcemia. P:   NS @ 125. 1g Ca gluconate. BMP in AM.  GASTROINTESTINAL A:   GI prophylaxis. Nutrition. P:   SUP: Famotidine. NPO.  HEMATOLOGIC A:   Anemia - chronic. Coagulopathy. VTE Prophylaxis. P:  Transfuse for Hgb < 7. Coags in AM. SCD's / Heparin. CBC in AM.  INFECTIOUS A:   Urosepsis. P:   Abx as above (Vanc / Levaquin / Ceftaz).  Follow cultures as above.  ENDOCRINE A:   DM. Hypothyroidism. P:   SSI. Continue outpatient synthroid, change to IV.  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 Hx CVA. P:   Sedation:  Fentanyl PRN / Midazolam PRN. RASS goal: 0 to -1. Daily WUA. Hold outpatient amitriptyline, gabapentin, percocet.  Family updated: Discussion with multiple family members including pt's husband, two daughters, multiple grandchildren and had extensive discussions with them. We discussed Mrs. Shadowens current circumstances and organ failures. We also discussed patient's prior wishes under circumstances such as this. The family has decided not to perform resuscitation if arrest were to occur, but to otherwise continue with current medical support / therapies.  Interdisciplinary Family Meeting v Palliative Care Meeting:  Due by: 03/22/15.   Montey Hora, [facility] Pager: [phone]  or [phone] 03/18/2015, 3:51 PM   STAFF NOTE: I, Merrie Roof, MD FACP have personally reviewed patient's available data, including medical history, events of note, physical examination and test results as part of my evaluation. I have discussed with  resident/NP and other care providers such as pharmacist, RN and RRT. In addition, I personally evaluated patient and elicited key findings of: unresponsive to pain, per not reactive, post arrest, ronchi bilateral rt greater left, shock, her down time is about 38 min pea , unwitnessed, likley she has PNA leading to resp , cardiac arrest, at risk flu, mods, asp?, her baseline functional status is poor, see DNR note, agree for CT head, given coags, get eeg for subclinical seizures, abg on vent, her prognosis is aweful, she is high risk to go brain  dead, will support on vent with medical management but no sig heroics, levophed tgo 10 mics, DNR, not a candidate for ttm, family updated in full The patient is critically ill with multiple organ systems failure and requires high complexity decision making for assessment and support, frequent evaluation and titration of therapies, application of advanced monitoring technologies and extensive interpretation of multiple databases.   Critical Care Time devoted to patient care services described in this note is 54 Minutes. This time reflects time of care of this signee: Merrie Roof, MD FACP. This critical care time does not reflect procedure time, or teaching time or supervisory time of PA/NP/Med student/Med Resident etc but could involve care discussion time. Rest per NP/medical resident whose note is outlined above and that I agree with   Lavon Paganini. Titus Mould, MD, [place] Pgr: [facility] 03/30/2015 4:42 PM

## 2015-03-16 NOTE — Progress Notes (Signed)
Patient ID: Jackquline BoschLinda K Keziah, female   DOB: May 18, 1945, 70 y.o.   MRN: [id number] I have had extensive discussions with family husband. We discussed patients current circumstances and organ failures. We also discussed patient's prior wishes under circumstances such as this. Family has decided to NOT perform resuscitation if arrest but to continue current medical support for now. Max levo at [employer] [facility]  Daniel J. Tyson AliasFeinstein, MD, FACP Pgr: [phone] [facility]

## 2015-03-16 NOTE — ED Notes (Signed)
Per Dr. Tyson AliasFeinstein- temp range between 34 and 36 celsius. notify less than 34 degrees celsius.

## 2015-03-16 NOTE — Progress Notes (Signed)
   03/24/2015 1641  Clinical Encounter Type  Visited With Family;Health care provider;Patient and family together  Visit Type Follow-up  Referral From Chaplain   Chaplain responded to a request to follow-up with the patient's family and offer support. Chaplain offered support, and our services are available as needed.   Alda Ponderdam M Kjersti Dittmer, Chaplain 04/01/2015 4:42 PM

## 2015-03-16 NOTE — ED Notes (Signed)
Per EMS- pt is bedbound from previous stroke. Pt ws with family and was noted to be unresponsive. CPR was initiated by family. Noted to be in PEA rhythm when EMS arrived.  Pt received 30 minutes or less of CPR. Pt received 6 epi, epi drip, 4mg  narcan, 1000ml NS and pulses returned. Pt no in sinus tach rhythm. Pt has a king airway in place. Pt also noted to have coffee ground emesis. IO in place.

## 2015-03-16 NOTE — Consult Note (Signed)
ANTIBIOTIC CONSULT NOTE - INITIAL  Pharmacy Consult for Vancomycin, Ceftazidime, Levaquin Indication: pneumonia  No Known Allergies  Patient Measurements: Height: 5' 6.14" (168 cm) Weight: 178 lb 9.2 oz (81 kg) IBW/kg (Calculated) : 59.63  Vital Signs: Temp: 95.2 F (35.1 C) (01/09 1510) Temp Source: Rectal (01/09 1423) BP: 78/44 mmHg (01/09 1510) Pulse Rate: 105 (01/09 1510) Intake/Output from previous day:   Intake/Output from this shift:    Labs:  Recent Labs  03/15/2015 1341 03/19/2015 1356  WBC 30.2*  --   HGB 8.1* 9.2*  PLT 201  --   CREATININE 2.21* 1.50*   Estimated Creatinine Clearance: 39.2 mL/min (by C-G formula based on Cr of 1.5).   Microbiology: No results found for this or any previous visit (from the past 720 hour(s)).  Medical History: Past Medical History  Diagnosis Date  . Diabetes mellitus   . Hypothyroidism   . Hypertension   . Hyperlipidemia    Assessment: 69yof s/p cardiac arrest with multifocal bilateral pulmonary infiltrates on CXR to begin vancomycin, ceftazidime, and levaquin. Renal insufficiency noted with sCr 1.5 and CrCl 39 ml/min.  Goal of Therapy:  Vancomycin trough level 15-20 mcg/ml  Plan:  1) Vancomycin 1g IV q24 2) Ceftazidime 1g IV q12 3) Levaquin 750mg  IV q48 4) Follow renal function, cultures, LOT, level if needed  Fredrik RiggerMarkle, Eliberto Sole Sue 03/19/2015,3:55 PM

## 2015-03-16 NOTE — Progress Notes (Signed)
RT and second RT tried to get an ABG from pt, unable to obtain.

## 2015-03-16 NOTE — ED Provider Notes (Signed)
I saw and evaluated the patient, reviewed the resident's note and I agree with the findings and plan.  Pertinent History: The patient is a 70 year old female, according to the medical record the patient has had a prior history of acute respiratory distress syndrome after being admitted to the hospital with healthcare associated pneumonia, developing sepsis and requiring a prolonged intubation followed by tracheostomy. The patient had a prolonged hospital course, she was discharged with a PICC line and a tracheostomy. The patient presents today by ambulance after family noticed that she went unresponsive she was on the bed. According to family members the patient has had a stroke and is bedbound. The patient is not able to speak, level V caveat applies. Her medics report that they had to give 6 doses of epinephrine followed by an epinephrine drip, they did this through an intraosseous line in the left proximal tibia. She was given Narcan and CBG was over 400. She had return of spontaneous circulation and went from a PE a rhythm in the 150 range to a mild sinus tachycardia with pulses. She did not require CPR after the initial resuscitative..  Pertinent Exam findings: On exam the patient has a GCS of 3, her face and neck is covered with what appears to be coffee-ground emesis. Her oropharynx and mouth is full of coffee-ground emesis, King airway is present in the oropharynx, lungs are diminished and there is rhonchi with assisted ventilations, pulses are thready at the femoral pulses, heart sounds are decreased but no obvious murmurs. No peripheral edema of note, abdomen is soft, no obvious injuries. When the patient is rolled she is noted to have no rectal tone, stool tested, stool was regular brown stool, no blood, no melena. The patient had ongoing unstable status, the [facility] airway was switched for endotracheal tube by the resident, please see separate note. Due to hypotension and only having a 20-gauge IV  central line was placed by the resident, please see separate note. The patient was given Pepcid, Protonix, type and screen, hemoglobin came back at just about 8, she was coagulopathic, chest x-ray showed multifocal pneumonia, broad-spectrum metabolic set been ordered, multiple IV fluid boluses for hypotension, discussed with critical care, will be admitted to the intensive care unit.`    I was personally present and directly supervised the following procedures:  #1 - Intubation #2 - Central Line Placement    CRITICAL CARE Performed by: Vida Roller Total critical care time: 35 minutes Critical care time was exclusive of separately billable procedures and treating other patients. Critical care was necessary to treat or prevent imminent or life-threatening deterioration. Critical care was time spent personally by me on the following activities: development of treatment plan with patient and/or surrogate as well as nursing, discussions with consultants, evaluation of patient's response to treatment, examination of patient, obtaining history from patient or surrogate, ordering and performing treatments and interventions, ordering and review of laboratory studies, ordering and review of radiographic studies, pulse oximetry and re-evaluation of patient's condition.   EKG Interpretation  Date/Time:  Monday March 16 2015 13:44:31 EST Ventricular Rate:  110 PR Interval:  147 QRS Duration: 84 QT Interval:  321 QTC Calculation: 434 R Axis:   45 Text Interpretation:  Sinus tachycardia Probable left atrial enlargement Low voltage, precordial leads Abnormal R-wave progression, early transition Borderline repol abnormality, diffuse leads Abnormal ekg Since last tracing diffuse ST depression seen Confirmed by Nirel Babler  MD, Anurag Scarfo ([id number]) on 03/28/2015 2:27:57 PM  Final diagnoses:  Cardiac arrest (HCC)  Sepsis, due to unspecified organism (HCC)  Upper GI bleed  Acute renal failure,  unspecified acute renal failure type (HCC)      Grace HongBrian Mohini Heathcock, MD 03/12/2015 1151

## 2015-03-17 ENCOUNTER — Encounter (HOSPITAL_COMMUNITY): Payer: Self-pay | Admitting: *Deleted

## 2015-03-17 ENCOUNTER — Inpatient Hospital Stay (HOSPITAL_COMMUNITY): Payer: Commercial Managed Care - HMO

## 2015-03-17 ENCOUNTER — Other Ambulatory Visit (HOSPITAL_COMMUNITY): Payer: Commercial Managed Care - HMO

## 2015-03-17 LAB — GLUCOSE, CAPILLARY
GLUCOSE-CAPILLARY: 336 mg/dL — AB (ref 65–99)
Glucose-Capillary: 305 mg/dL — ABNORMAL HIGH (ref 65–99)
Glucose-Capillary: 328 mg/dL — ABNORMAL HIGH (ref 65–99)

## 2015-03-17 LAB — CBC
HCT: 29.9 % — ABNORMAL LOW (ref 36.0–46.0)
Hemoglobin: 8.6 g/dL — ABNORMAL LOW (ref 12.0–15.0)
MCH: 25.7 pg — AB (ref 26.0–34.0)
MCHC: 28.8 g/dL — AB (ref 30.0–36.0)
MCV: 89.5 fL (ref 78.0–100.0)
PLATELETS: 199 10*3/uL (ref 150–400)
RBC: 3.34 MIL/uL — AB (ref 3.87–5.11)
RDW: 14.7 % (ref 11.5–15.5)
WBC: 11.7 10*3/uL — ABNORMAL HIGH (ref 4.0–10.5)

## 2015-03-17 LAB — BASIC METABOLIC PANEL
Anion gap: 21 — ABNORMAL HIGH (ref 5–15)
BUN: 51 mg/dL — AB (ref 6–20)
CALCIUM: 7.1 mg/dL — AB (ref 8.9–10.3)
CO2: 10 mmol/L — ABNORMAL LOW (ref 22–32)
CREATININE: 2.61 mg/dL — AB (ref 0.44–1.00)
Chloride: 106 mmol/L (ref 101–111)
GFR calc Af Amer: 21 mL/min — ABNORMAL LOW (ref >60)
GFR, EST NON AFRICAN AMERICAN: 18 mL/min — AB (ref >60)
GLUCOSE: 406 mg/dL — AB (ref 65–99)
Potassium: 6.3 mmol/L (ref 3.5–5.1)
SODIUM: 137 mmol/L (ref 135–145)

## 2015-03-17 LAB — TROPONIN I: TROPONIN I: 0.67 ng/mL — AB (ref <0.031)

## 2015-03-17 LAB — PROTIME-INR
INR: 3.1 — AB (ref 0.00–1.49)
Prothrombin Time: 31.4 seconds — ABNORMAL HIGH (ref 11.6–15.2)

## 2015-03-17 LAB — MAGNESIUM: Magnesium: 2.5 mg/dL — ABNORMAL HIGH (ref 1.7–2.4)

## 2015-03-17 LAB — PHOSPHORUS: Phosphorus: 10.4 mg/dL — ABNORMAL HIGH (ref 2.5–4.6)

## 2015-03-17 MED ORDER — SODIUM BICARBONATE 8.4 % IV SOLN
100.0000 meq | Freq: Once | INTRAVENOUS | Status: AC
  Administered 2015-03-17: 100 meq via INTRAVENOUS
  Filled 2015-03-17: qty 50

## 2015-03-17 MED ORDER — SODIUM POLYSTYRENE SULFONATE 15 GM/60ML PO SUSP
30.0000 g | Freq: Once | ORAL | Status: AC
  Administered 2015-03-17: 30 g
  Filled 2015-03-17: qty 120

## 2015-03-18 LAB — URINE CULTURE: Culture: >=100000

## 2015-03-19 LAB — CULTURE, BLOOD (ROUTINE X 2)

## 2015-03-21 LAB — CULTURE, BLOOD (ROUTINE X 2): CULTURE: NO GROWTH

## 2015-03-24 ENCOUNTER — Telehealth: Payer: Self-pay

## 2015-03-24 NOTE — Telephone Encounter (Signed)
On 03/24/2015 I received a death certificate from [employer] (original). The death certificate is for cremation. The patient is a patient of Doctor Tyson Alias. The death certificate will be taken to [facility] today for signature. On Apr 08, 2015 I received the death certificate back from Doctor Tyson Alias. I got the death certificate ready and called the funeral home to let them know the death certificate is ready for pickup.

## 2015-04-08 NOTE — Progress Notes (Addendum)
Patient's heart rhythm asystole on the monitor at 0837. Family at bedside and made aware.  Patients remains on vent at this time. Unable to confirm death until ventilator turned off by RT.   2 RN's confirmed death, no pupillary response, no cardiac heart sounds, no pulse, no respirations at 0850.  Dawson BillsKim Burgess, RN Halina MaidensMorin Evelyne Makepeace, RN

## 2015-04-08 NOTE — Progress Notes (Signed)
[facility] Physician-Brief Progress Note Patient Name: Grace BoschLinda K Mitchell DOB: 29-Jun-1945 MRN: [id number]   Date of Service  03/11/2015  HPI/Events of Note  K+ = 6.3 and hypotension - BP = 63/33. Currently on Norepinephrine at 10 mcg/hour with NO escalation.   eICU Interventions  Will order: 1. Kayexalate 30 gm via tube now. 2. NaHCO3 100 meq IV now.   Consider comfort measures as she has done poorly overnight.     Intervention Category Major Interventions: Electrolyte abnormality - evaluation and management  Sommer,Steven Eugene 04/06/2015, 5:33 AM

## 2015-04-08 NOTE — Discharge Summary (Signed)
NAMECAROLEE, Grace Mitchell                  ACCOUNT NO.:  [id number]  MEDICAL RECORD NO.:  [id number]  LOCATION:  2H12C                        FACILITY:  [facility]  PHYSICIAN:  Nelda Bucks, MD DATE OF BIRTH:  1945-06-04  DATE OF ADMISSION:  03/21/2015 DATE OF DISCHARGE:  2015/03/19                              DISCHARGE SUMMARY   DEATH SUMMARY  This is a 70 year old female, with a past medical history outlined with diabetes, hypothyroidism, hypertension, hyperlipidemia as well as a stroke, some 45 years ago as well as admission last March for septic shock, ARDS due to H1N1 pneumococcal pneumonia for which she required a tracheostomy.  Since that time, was eventually ultimately decannulated, but failed to thrive and had a very poor functional status at home.  The patient reports unfortunately is found to have a cardiac arrest with a total down time probably ranging closer to about 38 minutes of a poor rhythm of PEA.  She was taken to the emergency room [facility] at [facility], family discussions were had.  The patient was made DNR.  She continued to have shock.  It was concerning that she potentially had pneumonia leading to her respiratory arrest with aspiration.  She was also at risk of flu.  She also had worsening renal failure.  She essentially had a very poor neurologic examination.  She was medicated for temperature management.  Her prognosis was actually awful with her prior functional status for the high risk of likely going brain dead with a down time of PEA close to 40 minutes.  The patient continued to have refractory shock, despite Levophed, with no escalation order after approximately 10-15 mcg of Levophed.  The patient also was hypokalemic for which we treated.  The patient unfortunately expired despite the continued support with family presence.  FINAL DIAGNOSES:  Upon death; 1. Likely pneumonia, leading to cardiac arrest. 2. Prolonged pulseless electrical  activity (PEA) cardiac arrest. 3. Acute renal failure with hyperkalemia. 4. Shock, likely cardiogenic, rule out septic shock. 5. Refractory lactic acidosis, secondary to prolonged cardiac arrest. 6. Likely severe anoxic brain injury.     Nelda Bucks, MD     DJF/MEDQ  D:  03/24/2015  T:  03/24/2015  Job:  [id number]

## 2015-04-08 NOTE — Procedures (Signed)
Unable to obtain ABG x2. Family request NOT to attempt again.

## 2015-04-08 NOTE — Care Management Note (Signed)
Case Management Note  Patient Details  Name: Jackquline BoschLinda K Judice MRN: [id number] Date of Birth: [birth year]  Subjective/Objective:          Adm w cardiac arrest, vent         Action/Plan: lives w fam   Expected Discharge Date:                  Expected Discharge Plan:     In-House Referral:     Discharge planning Services     Post Acute Care Choice:    Choice offered to:     DME Arranged:    DME Agency:     HH Arranged:    HH Agency:     Status of Service:     Medicare Important Message Given:    Date Medicare IM Given:    Medicare IM give by:    Date Additional Medicare IM Given:    Additional Medicare Important Message give by:     If discussed at Long Length of Stay Meetings, dates discussed:    Additional Comments:ur review done  Hanley HaysDowell, Vivian Neuwirth T, RN 09-02-2015, 8:16 AM

## 2015-04-08 NOTE — Progress Notes (Signed)
Patient taken to morgue. 

## 2015-04-08 NOTE — Progress Notes (Signed)
RN called d/t pt expired earlier.  Asystole.  Per RN and family in room- wishes to have ventilator turned off and pt extubated now that she is expired.

## 2015-04-08 DEATH — deceased

## 2016-07-26 IMAGING — DX DG CHEST 1V PORT
1 series · 1 of 1 positions shown · non-contrast
Comparison: Chest radiograph performed earlier today at [DATE] p.m.

CLINICAL DATA: Worsening shortness of breath and decreased O2
saturation. Initial encounter.

EXAM:
PORTABLE CHEST - 1 VIEW

[chest ap]
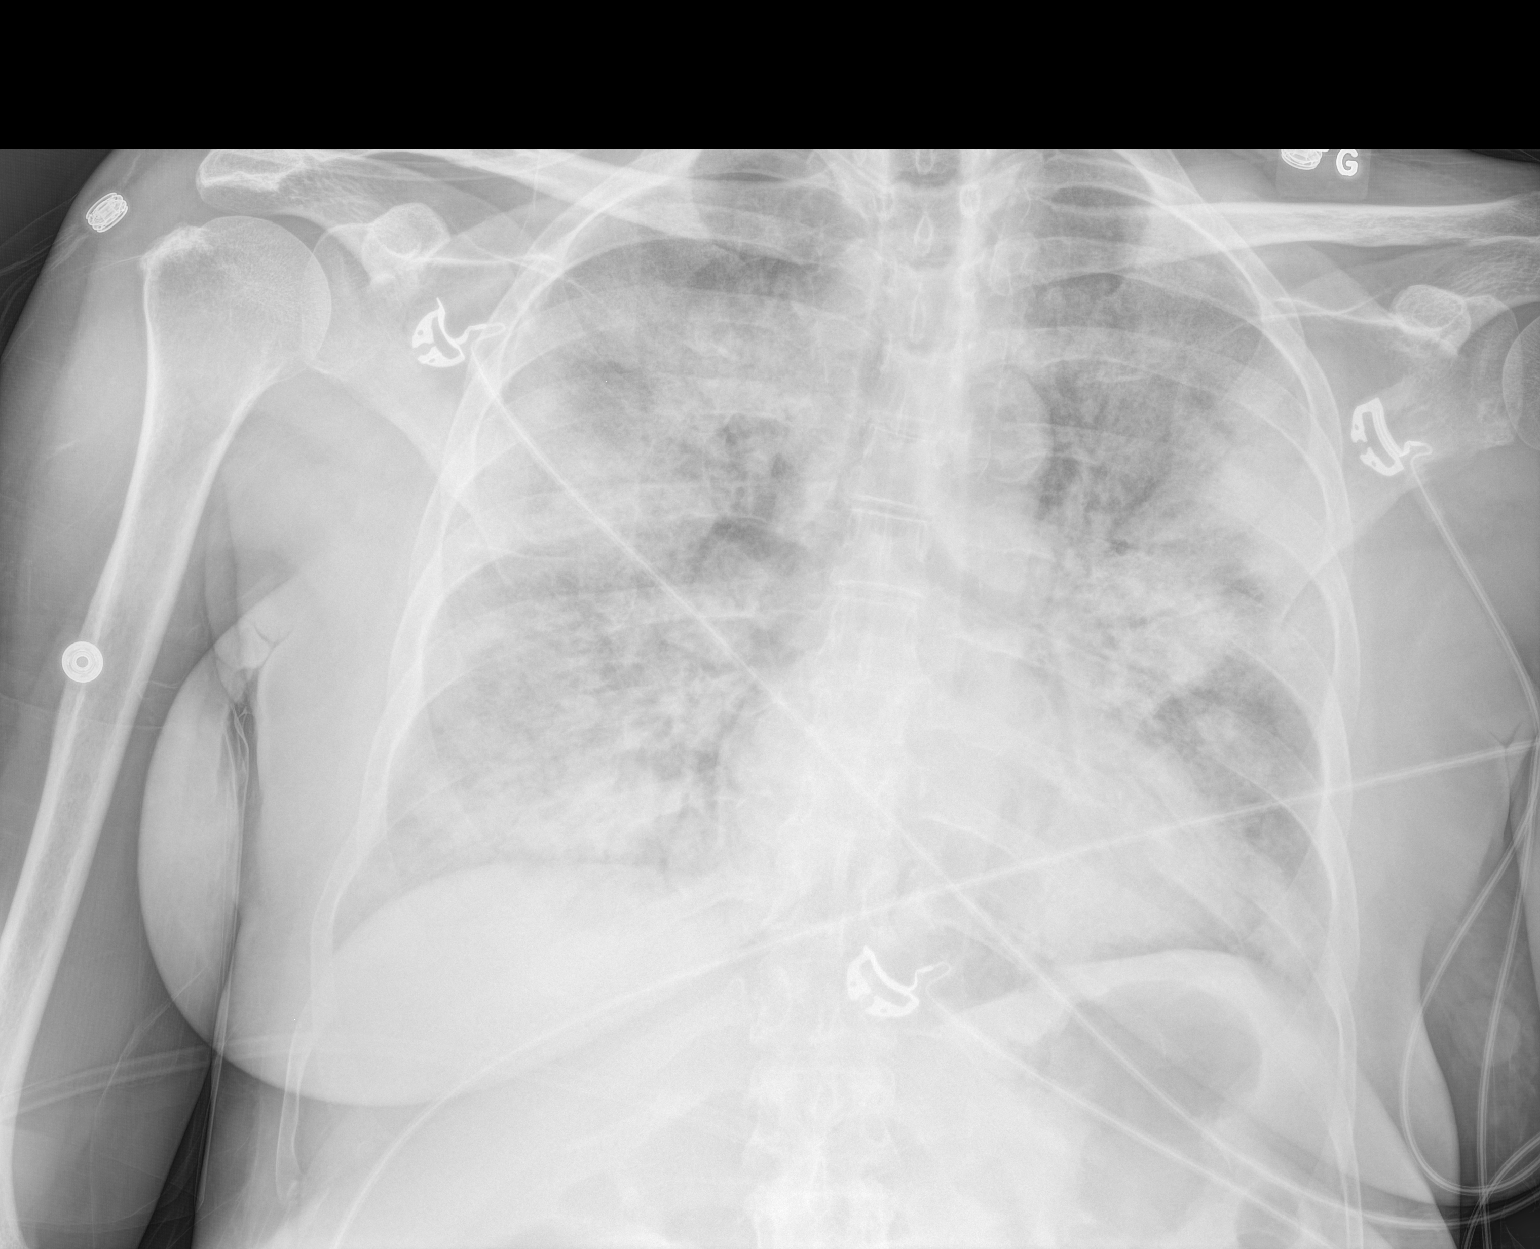

[1 of 1 positions shown; findings below may reference images not displayed]

FINDINGS: Significantly worsened diffuse bilateral airspace opacification may
reflect diffuse pneumonia, pulmonary edema or ARDS. No pleural
effusion or pneumothorax is seen.

The cardiomediastinal silhouette is borderline normal in size. No
acute osseous abnormalities are identified.
IMPRESSION: Significantly worsened diffuse bilateral airspace opacification may
reflect diffuse pneumonia, pulmonary edema or ARDS.

## 2016-07-31 IMAGING — DX DG CHEST 1V PORT
1 series · 1 of 1 positions shown · non-contrast
Comparison: 05/24/2014

CLINICAL DATA: Followup influenza with bilateral lung infiltrates.

EXAM:
PORTABLE CHEST - 1 VIEW

[chest ap]
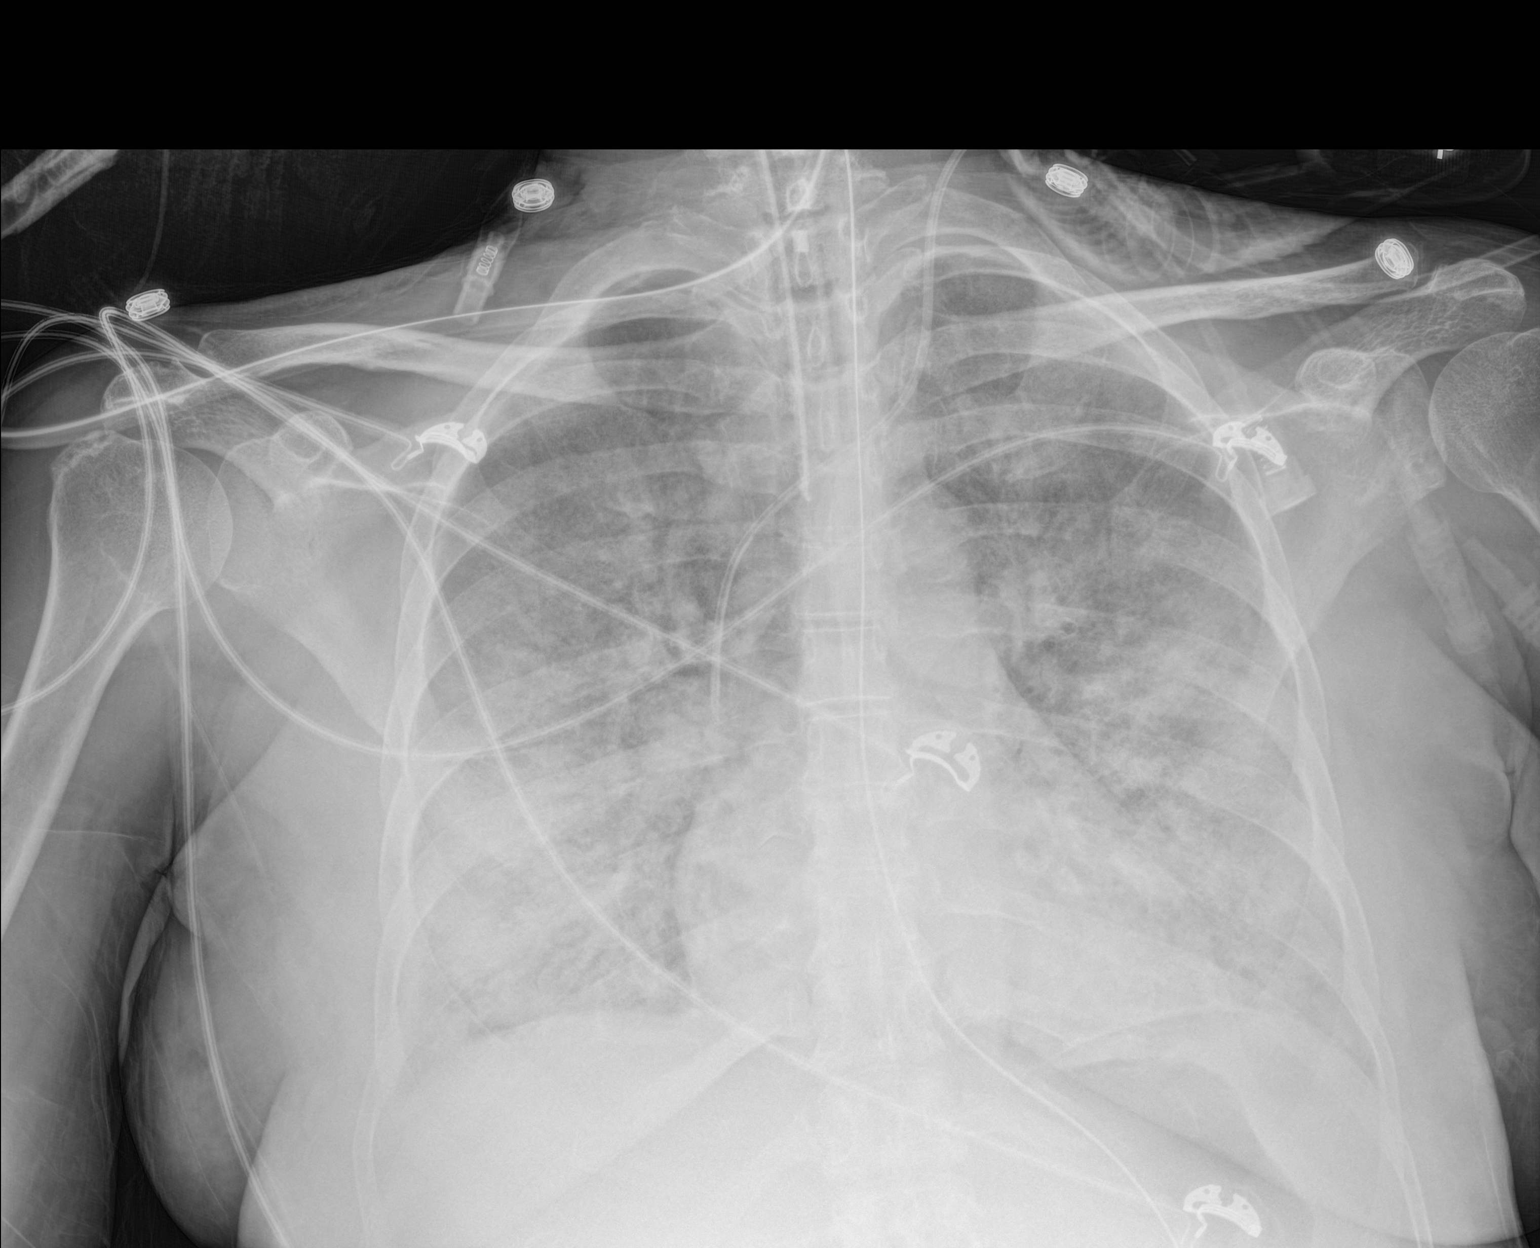

[1 of 1 positions shown; findings below may reference images not displayed]

FINDINGS: Bilateral airspace lung opacities have shown a minimal degree of
improvement since the previous day's study. No new lung
abnormalities. No pneumothorax.

Endotracheal tube, left internal jugular central venous line and
nasogastric tube are stable in well positioned.
IMPRESSION: Minimal improvement in bilateral lung opacities since the previous
day's study. No other change.

## 2016-08-01 IMAGING — DX DG CHEST 1V PORT
1 series · 1 of 1 positions shown · non-contrast
Comparison: 05/25/2014

CLINICAL DATA: Pneumonia

EXAM:
PORTABLE CHEST - 1 VIEW

[chest ap]
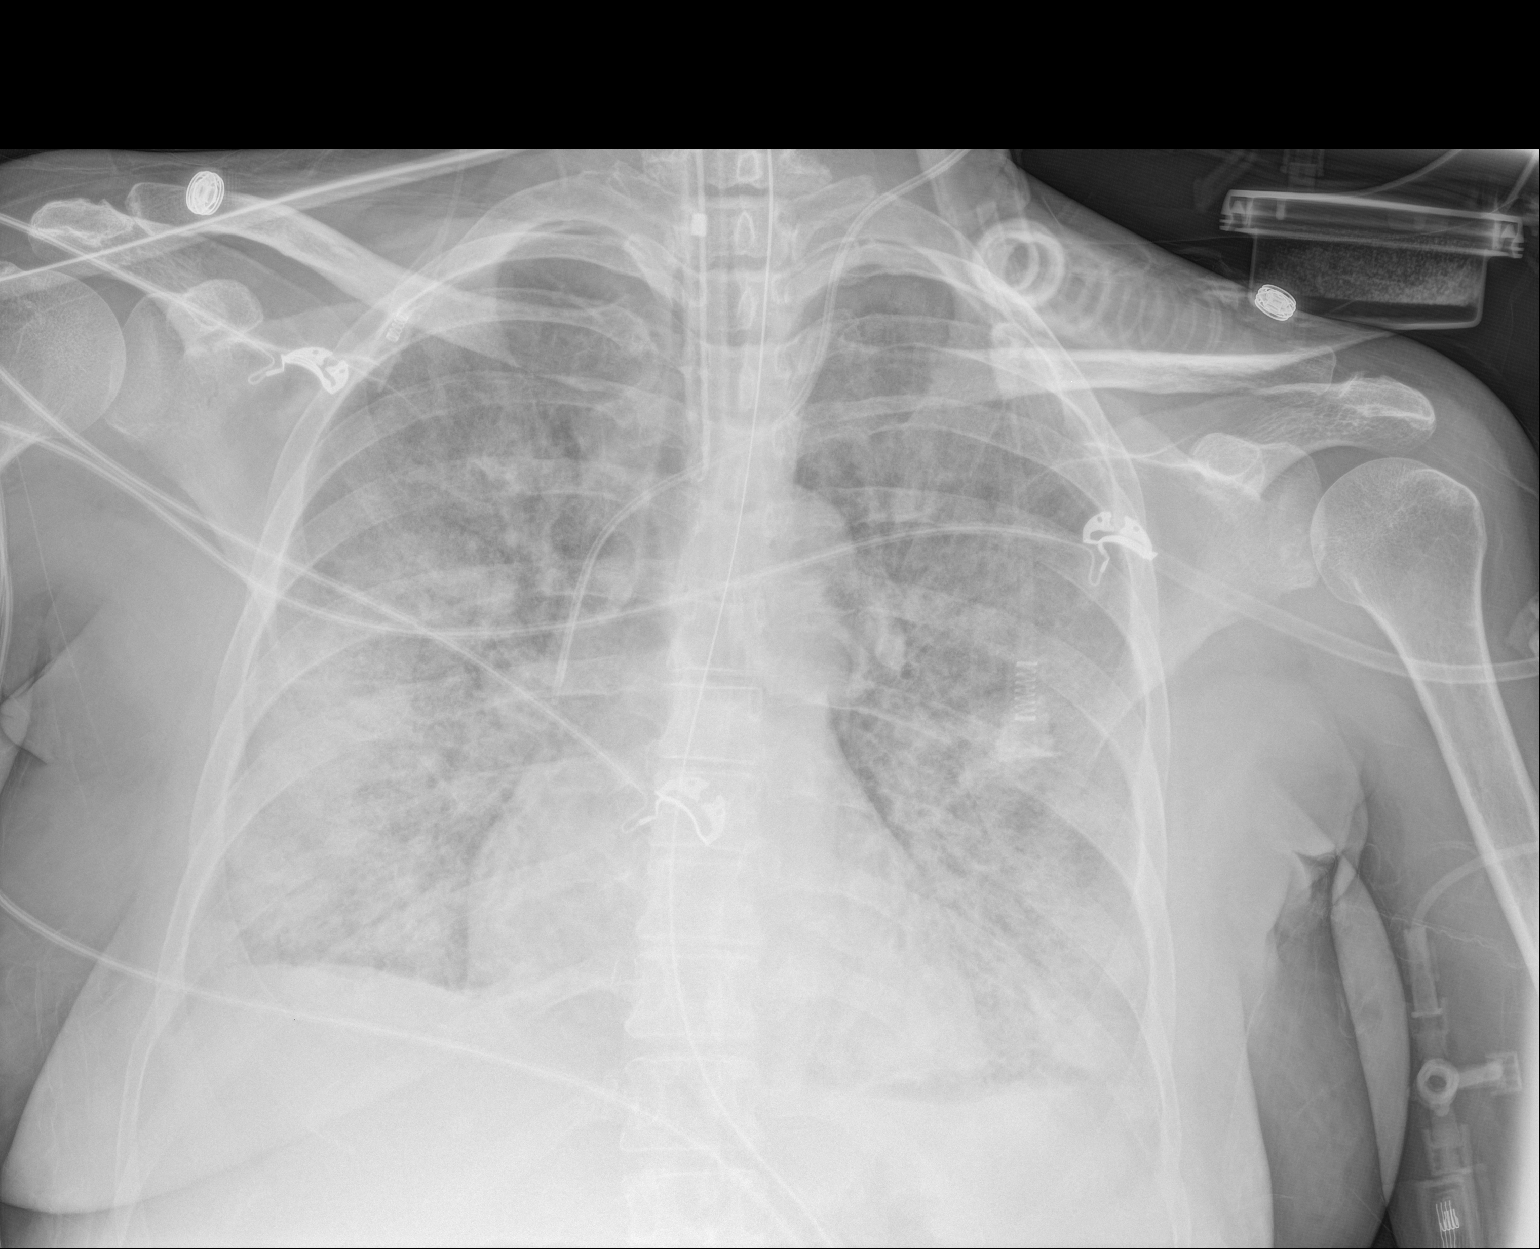

[1 of 1 positions shown; findings below may reference images not displayed]

FINDINGS: Endotracheal tube tip is 4 cm above the carina. Nasogastric tube
extends into the stomach. Left jugular central line extends into the
SVC. Diffuse airspace opacities persist without significant interval
change.
IMPRESSION: Support equipment appears satisfactorily positioned.

No significant interval change in the bilateral airspace opacities.

## 2016-08-02 IMAGING — DX DG CHEST 1V PORT
1 series · 1 of 1 positions shown · non-contrast
Comparison: 05/26/2014

CLINICAL DATA: Respiratory failure.  Endotracheal tube.

EXAM:
PORTABLE CHEST - 1 VIEW

[chest ap]
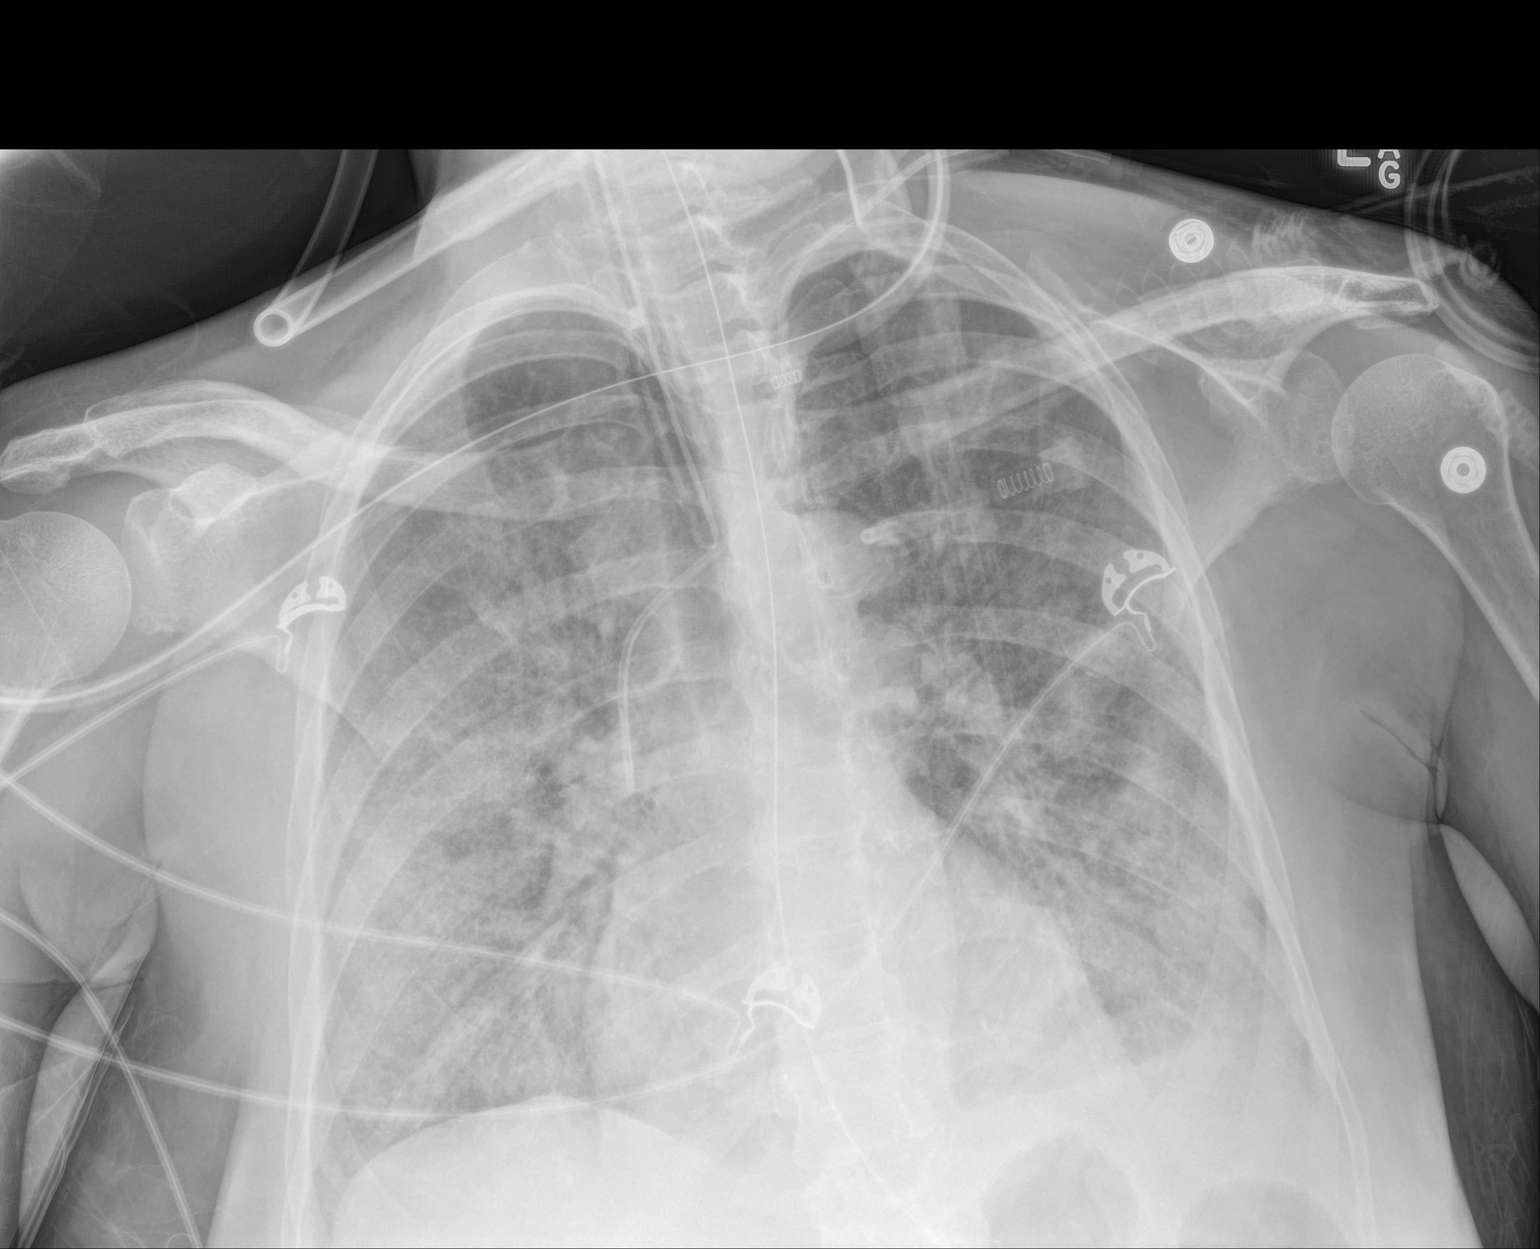

[1 of 1 positions shown; findings below may reference images not displayed]

FINDINGS: Endotracheal tube is 3.8 cm above the carina. Nasogastric tube
extends into the stomach. Left jugular central line extends into the
SVC. Diffuse airspace opacities persist bilaterally and may be
slightly improved compared to the previous day.
IMPRESSION: Support equipment appears satisfactorily positioned.

Persistent diffuse airspace opacities with slight improvement.

## 2016-08-03 IMAGING — CR DG ABD PORTABLE 1V
1 series · 1 of 1 positions shown · non-contrast
Comparison: 05/28/2014.

CLINICAL DATA: Feeding tube placement.

EXAM:
PORTABLE ABDOMEN - 1 VIEW

[ap (kub)]
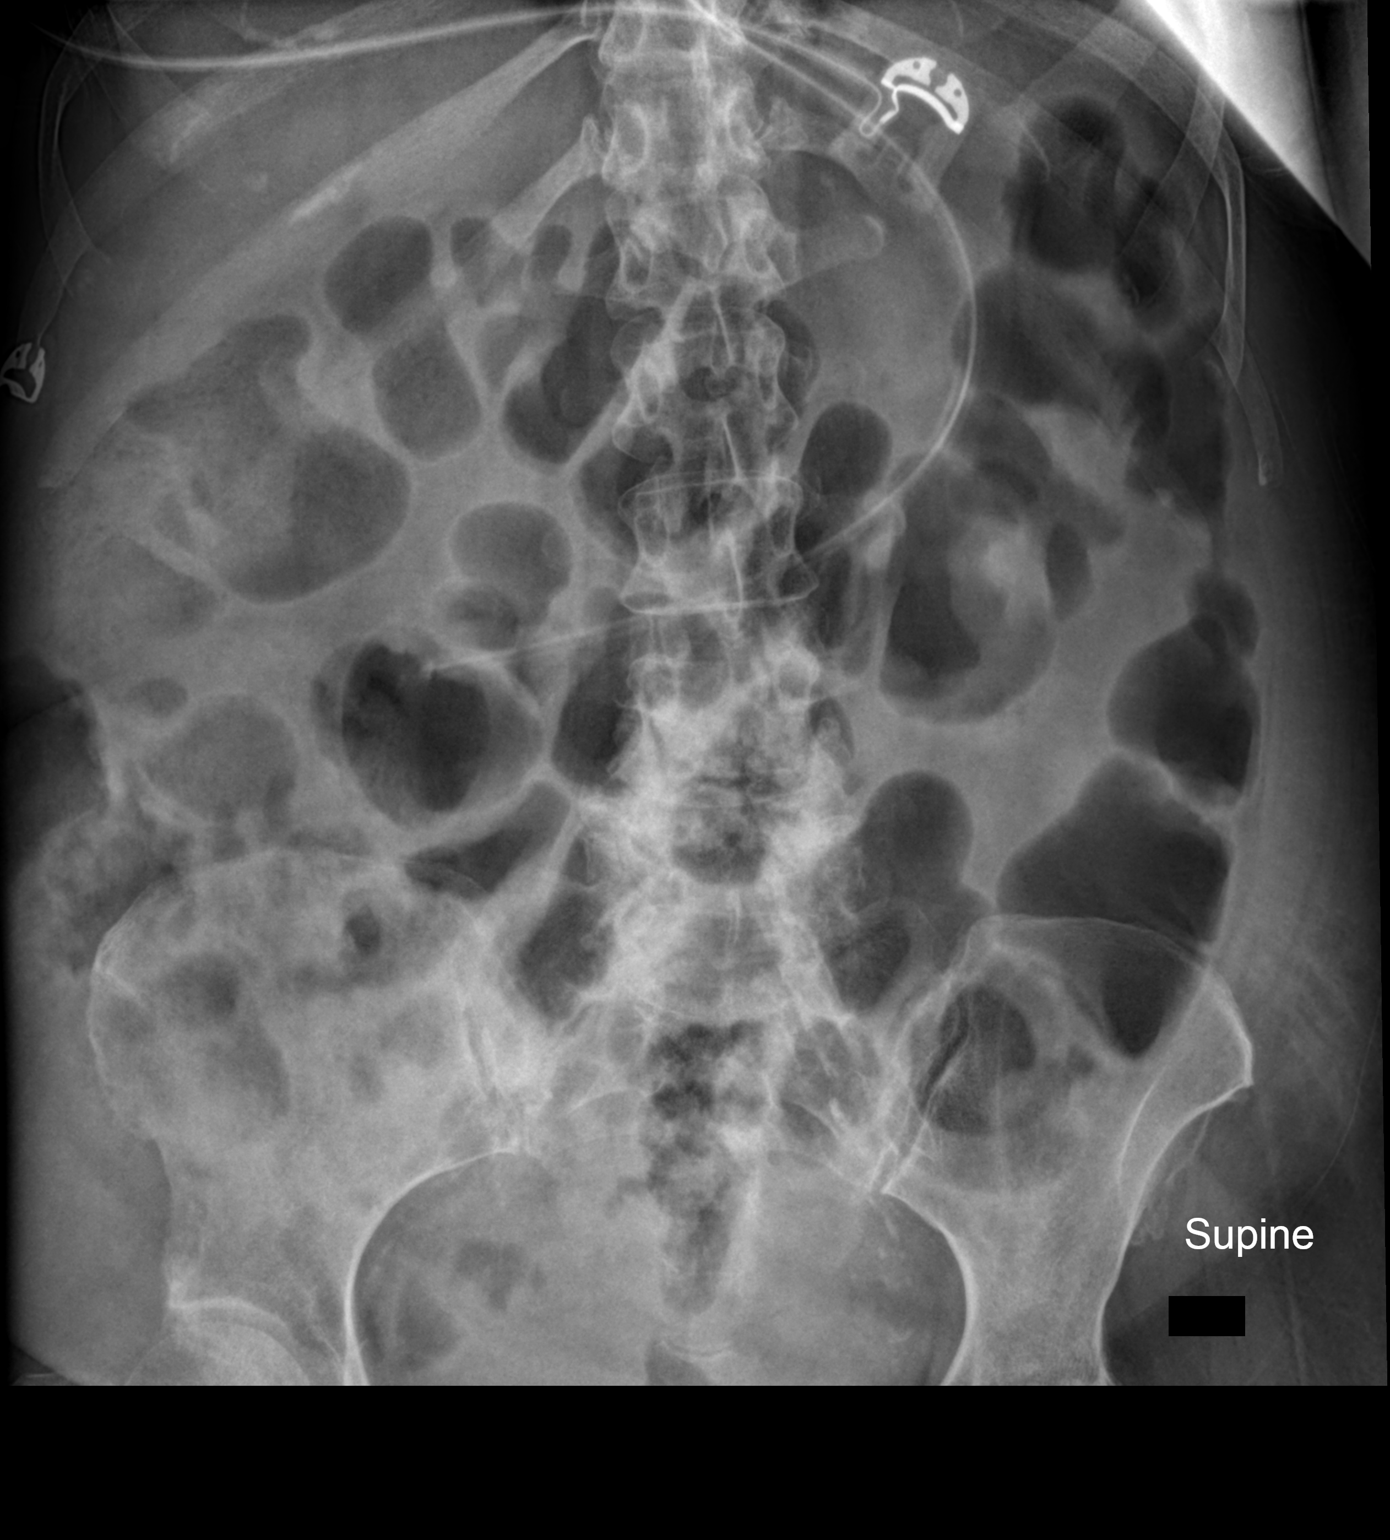

[1 of 1 positions shown; findings below may reference images not displayed]

FINDINGS: The feeding tube has been retracted. The tip is in the antral
pyloric region of the stomach. Otherwise the exam is unchanged. Wire
is present over the pelvis, likely thermistor from Foley catheter.
IMPRESSION: Enteric tube tip in the antropyloric of the stomach.
